# Patient Record
Sex: Male | Born: 1935 | Race: White | Hispanic: No | Marital: Married | Smoking: Former smoker
Health system: Southern US, Community
[De-identification: ages and names within clinical notes are randomized; demographics above are authoritative.]

## PROBLEM LIST (undated history)

## (undated) DIAGNOSIS — M1990 Unspecified osteoarthritis, unspecified site: Secondary | ICD-10-CM

## (undated) DIAGNOSIS — C3410 Malignant neoplasm of upper lobe, unspecified bronchus or lung: Secondary | ICD-10-CM

## (undated) DIAGNOSIS — I442 Atrioventricular block, complete: Secondary | ICD-10-CM

## (undated) DIAGNOSIS — J439 Emphysema, unspecified: Secondary | ICD-10-CM

## (undated) DIAGNOSIS — E785 Hyperlipidemia, unspecified: Secondary | ICD-10-CM

## (undated) DIAGNOSIS — I7300 Raynaud's syndrome without gangrene: Secondary | ICD-10-CM

## (undated) DIAGNOSIS — I2510 Atherosclerotic heart disease of native coronary artery without angina pectoris: Secondary | ICD-10-CM

## (undated) DIAGNOSIS — J301 Allergic rhinitis due to pollen: Secondary | ICD-10-CM

## (undated) DIAGNOSIS — C61 Malignant neoplasm of prostate: Secondary | ICD-10-CM

## (undated) DIAGNOSIS — H3530 Unspecified macular degeneration: Secondary | ICD-10-CM

## (undated) DIAGNOSIS — I714 Abdominal aortic aneurysm, without rupture, unspecified: Secondary | ICD-10-CM

## (undated) DIAGNOSIS — H9313 Tinnitus, bilateral: Secondary | ICD-10-CM

## (undated) HISTORY — DX: Abdominal aortic aneurysm, without rupture, unspecified: I71.40

## (undated) HISTORY — DX: Atrioventricular block, complete: I44.2

## (undated) HISTORY — DX: Unspecified macular degeneration: H35.30

## (undated) HISTORY — PX: ANGIOPLASTY / STENTING FEMORAL: SUR30

## (undated) HISTORY — DX: Unspecified osteoarthritis, unspecified site: M19.90

## (undated) HISTORY — DX: Atherosclerotic heart disease of native coronary artery without angina pectoris: I25.10

## (undated) HISTORY — PX: POLYPECTOMY: SHX149

## (undated) HISTORY — PX: CATARACT EXTRACTION, BILATERAL: SHX1313

## (undated) HISTORY — DX: Raynaud's syndrome without gangrene: I73.00

## (undated) HISTORY — DX: Tinnitus, bilateral: H93.13

## (undated) HISTORY — DX: Malignant neoplasm of upper lobe, unspecified bronchus or lung: C34.10

## (undated) HISTORY — DX: Allergic rhinitis due to pollen: J30.1

## (undated) HISTORY — DX: Hyperlipidemia, unspecified: E78.5

## (undated) HISTORY — DX: Emphysema, unspecified: J43.9

## (undated) HISTORY — DX: Malignant neoplasm of prostate: C61

## (undated) HISTORY — DX: Abdominal aortic aneurysm, without rupture: I71.4

---

## 1998-01-19 DIAGNOSIS — C3410 Malignant neoplasm of upper lobe, unspecified bronchus or lung: Secondary | ICD-10-CM

## 1998-01-19 DIAGNOSIS — C61 Malignant neoplasm of prostate: Secondary | ICD-10-CM

## 1998-01-19 HISTORY — DX: Malignant neoplasm of prostate: C61

## 1998-01-19 HISTORY — PX: TRANSPERINEAL IMPLANT OF RADIATION SEEDS W/ ULTRASOUND: SUR1381

## 1998-01-19 HISTORY — PX: LUNG LOBECTOMY: SHX167

## 1998-01-19 HISTORY — DX: Malignant neoplasm of upper lobe, unspecified bronchus or lung: C34.10

## 1998-07-01 ENCOUNTER — Other Ambulatory Visit: Admission: RE | Admit: 1998-07-01 | Discharge: 1998-07-01 | Payer: Self-pay | Admitting: Urology

## 1998-07-17 ENCOUNTER — Encounter: Admission: RE | Admit: 1998-07-17 | Discharge: 1998-10-15 | Payer: Self-pay | Admitting: Radiation Oncology

## 1998-08-23 ENCOUNTER — Encounter: Payer: Self-pay | Admitting: Radiation Oncology

## 1998-09-04 ENCOUNTER — Ambulatory Visit (HOSPITAL_COMMUNITY): Admission: RE | Admit: 1998-09-04 | Discharge: 1998-09-04 | Payer: Self-pay | Admitting: Radiation Oncology

## 1998-09-18 ENCOUNTER — Encounter: Payer: Self-pay | Admitting: Thoracic Surgery

## 1998-09-19 ENCOUNTER — Encounter: Payer: Self-pay | Admitting: Thoracic Surgery

## 1998-09-19 ENCOUNTER — Inpatient Hospital Stay (HOSPITAL_COMMUNITY): Admission: RE | Admit: 1998-09-19 | Discharge: 1998-09-26 | Payer: Self-pay | Admitting: Thoracic Surgery

## 1998-09-20 ENCOUNTER — Encounter: Payer: Self-pay | Admitting: Thoracic Surgery

## 1998-09-21 ENCOUNTER — Encounter: Payer: Self-pay | Admitting: Thoracic Surgery

## 1998-09-22 ENCOUNTER — Encounter: Payer: Self-pay | Admitting: Thoracic Surgery

## 1998-09-23 ENCOUNTER — Encounter: Payer: Self-pay | Admitting: Thoracic Surgery

## 1998-09-24 ENCOUNTER — Encounter: Payer: Self-pay | Admitting: Thoracic Surgery

## 1998-09-25 ENCOUNTER — Encounter: Payer: Self-pay | Admitting: Thoracic Surgery

## 1998-09-26 ENCOUNTER — Encounter: Payer: Self-pay | Admitting: Thoracic Surgery

## 1998-10-30 ENCOUNTER — Encounter: Admission: RE | Admit: 1998-10-30 | Discharge: 1998-10-30 | Payer: Self-pay | Admitting: Thoracic Surgery

## 1998-12-11 ENCOUNTER — Encounter: Payer: Self-pay | Admitting: Thoracic Surgery

## 1998-12-11 ENCOUNTER — Encounter: Admission: RE | Admit: 1998-12-11 | Discharge: 1998-12-11 | Payer: Self-pay | Admitting: Thoracic Surgery

## 1998-12-18 ENCOUNTER — Encounter: Admission: RE | Admit: 1998-12-18 | Discharge: 1999-03-18 | Payer: Self-pay | Admitting: Radiation Oncology

## 1999-01-03 ENCOUNTER — Ambulatory Visit (HOSPITAL_BASED_OUTPATIENT_CLINIC_OR_DEPARTMENT_OTHER): Admission: RE | Admit: 1999-01-03 | Discharge: 1999-01-03 | Payer: Self-pay | Admitting: Urology

## 1999-01-03 ENCOUNTER — Encounter: Payer: Self-pay | Admitting: Urology

## 1999-01-24 ENCOUNTER — Encounter: Payer: Self-pay | Admitting: Radiation Oncology

## 1999-05-14 ENCOUNTER — Encounter: Admission: RE | Admit: 1999-05-14 | Discharge: 1999-05-14 | Payer: Self-pay | Admitting: Thoracic Surgery

## 1999-05-14 ENCOUNTER — Encounter: Payer: Self-pay | Admitting: Thoracic Surgery

## 1999-09-12 ENCOUNTER — Encounter: Admission: RE | Admit: 1999-09-12 | Discharge: 1999-09-12 | Payer: Self-pay | Admitting: Thoracic Surgery

## 1999-09-12 ENCOUNTER — Encounter: Payer: Self-pay | Admitting: Thoracic Surgery

## 2000-03-16 ENCOUNTER — Encounter: Admission: RE | Admit: 2000-03-16 | Discharge: 2000-03-16 | Payer: Self-pay | Admitting: Thoracic Surgery

## 2000-03-16 ENCOUNTER — Encounter: Payer: Self-pay | Admitting: Thoracic Surgery

## 2000-09-17 ENCOUNTER — Encounter: Payer: Self-pay | Admitting: Thoracic Surgery

## 2000-09-17 ENCOUNTER — Encounter: Admission: RE | Admit: 2000-09-17 | Discharge: 2000-09-17 | Payer: Self-pay | Admitting: Thoracic Surgery

## 2001-03-02 ENCOUNTER — Encounter: Admission: RE | Admit: 2001-03-02 | Discharge: 2001-03-02 | Payer: Self-pay | Admitting: Thoracic Surgery

## 2001-03-02 ENCOUNTER — Encounter: Payer: Self-pay | Admitting: Thoracic Surgery

## 2002-03-08 ENCOUNTER — Encounter: Payer: Self-pay | Admitting: Family Medicine

## 2002-03-08 ENCOUNTER — Encounter: Admission: RE | Admit: 2002-03-08 | Discharge: 2002-03-08 | Payer: Self-pay | Admitting: Family Medicine

## 2002-04-30 ENCOUNTER — Encounter: Payer: Self-pay | Admitting: Gastroenterology

## 2003-03-23 ENCOUNTER — Encounter: Admission: RE | Admit: 2003-03-23 | Discharge: 2003-03-23 | Payer: Self-pay | Admitting: Family Medicine

## 2004-02-18 ENCOUNTER — Encounter: Admission: RE | Admit: 2004-02-18 | Discharge: 2004-02-18 | Payer: Self-pay | Admitting: Family Medicine

## 2004-06-09 ENCOUNTER — Ambulatory Visit: Payer: Self-pay | Admitting: Family Medicine

## 2004-06-17 ENCOUNTER — Ambulatory Visit: Payer: Self-pay | Admitting: Family Medicine

## 2004-06-24 ENCOUNTER — Encounter: Admission: RE | Admit: 2004-06-24 | Discharge: 2004-06-24 | Payer: Self-pay | Admitting: Family Medicine

## 2005-01-19 HISTORY — PX: COLONOSCOPY: SHX174

## 2005-02-09 ENCOUNTER — Ambulatory Visit: Payer: Self-pay | Admitting: Gastroenterology

## 2005-02-20 ENCOUNTER — Encounter: Admission: RE | Admit: 2005-02-20 | Discharge: 2005-02-20 | Payer: Self-pay | Admitting: Family Medicine

## 2005-02-25 ENCOUNTER — Encounter (INDEPENDENT_AMBULATORY_CARE_PROVIDER_SITE_OTHER): Payer: Self-pay | Admitting: *Deleted

## 2005-02-26 ENCOUNTER — Encounter (INDEPENDENT_AMBULATORY_CARE_PROVIDER_SITE_OTHER): Payer: Self-pay | Admitting: *Deleted

## 2005-02-26 ENCOUNTER — Ambulatory Visit: Payer: Self-pay | Admitting: Gastroenterology

## 2005-06-19 ENCOUNTER — Ambulatory Visit: Payer: Self-pay | Admitting: Family Medicine

## 2005-06-22 ENCOUNTER — Encounter: Admission: RE | Admit: 2005-06-22 | Discharge: 2005-06-22 | Payer: Self-pay | Admitting: Family Medicine

## 2005-06-22 ENCOUNTER — Encounter: Payer: Self-pay | Admitting: Family Medicine

## 2006-03-15 ENCOUNTER — Encounter: Admission: RE | Admit: 2006-03-15 | Discharge: 2006-03-15 | Payer: Self-pay | Admitting: Family Medicine

## 2006-07-08 ENCOUNTER — Ambulatory Visit: Payer: Self-pay | Admitting: Family Medicine

## 2006-07-08 LAB — CONVERTED CEMR LAB
Alkaline Phosphatase: 93 units/L (ref 39–117)
Basophils Absolute: 0 10*3/uL (ref 0.0–0.1)
Basophils Relative: 0.2 % (ref 0.0–1.0)
Calcium: 9.4 mg/dL (ref 8.4–10.5)
Chloride: 102 meq/L (ref 96–112)
Creatinine, Ser: 0.7 mg/dL (ref 0.4–1.5)
Eosinophils Absolute: 0.2 10*3/uL (ref 0.0–0.6)
Eosinophils Relative: 1.8 % (ref 0.0–5.0)
HCT: 43.9 % (ref 39.0–52.0)
LDL Cholesterol: 131 mg/dL — ABNORMAL HIGH (ref 0–99)
MCHC: 34.2 g/dL (ref 30.0–36.0)
Monocytes Absolute: 0.7 10*3/uL (ref 0.2–0.7)
Monocytes Relative: 7.9 % (ref 3.0–11.0)
Neutro Abs: 6.4 10*3/uL (ref 1.4–7.7)
Neutrophils Relative %: 72.2 % (ref 43.0–77.0)
Platelets: 248 10*3/uL (ref 150–400)
RDW: 12.1 % (ref 11.5–14.6)
Sodium: 136 meq/L (ref 135–145)
Triglycerides: 88 mg/dL (ref 0–149)
VLDL: 18 mg/dL (ref 0–40)

## 2006-08-16 DIAGNOSIS — C61 Malignant neoplasm of prostate: Secondary | ICD-10-CM

## 2006-08-16 DIAGNOSIS — I739 Peripheral vascular disease, unspecified: Secondary | ICD-10-CM | POA: Insufficient documentation

## 2006-08-16 DIAGNOSIS — B182 Chronic viral hepatitis C: Secondary | ICD-10-CM | POA: Insufficient documentation

## 2006-08-16 DIAGNOSIS — C3410 Malignant neoplasm of upper lobe, unspecified bronchus or lung: Secondary | ICD-10-CM

## 2007-03-14 ENCOUNTER — Ambulatory Visit: Payer: Self-pay | Admitting: Family Medicine

## 2007-07-11 ENCOUNTER — Ambulatory Visit: Payer: Self-pay | Admitting: Family Medicine

## 2007-07-11 DIAGNOSIS — I714 Abdominal aortic aneurysm, without rupture: Secondary | ICD-10-CM

## 2007-07-11 LAB — CONVERTED CEMR LAB
BUN: 15 mg/dL (ref 6–23)
Basophils Absolute: 0.1 10*3/uL (ref 0.0–0.1)
Bilirubin, Direct: 0.1 mg/dL (ref 0.0–0.3)
Chloride: 101 meq/L (ref 96–112)
Cholesterol: 186 mg/dL (ref 0–200)
Eosinophils Absolute: 0.1 10*3/uL (ref 0.0–0.7)
Glucose, Bld: 99 mg/dL (ref 70–99)
HCT: 41.4 % (ref 39.0–52.0)
HDL: 32.9 mg/dL — ABNORMAL LOW (ref >39.0)
Hemoglobin: 14.6 g/dL (ref 13.0–17.0)
LDL Cholesterol: 135 mg/dL — ABNORMAL HIGH (ref 0–99)
Lymphocytes Relative: 14.1 % (ref 12.0–46.0)
MCHC: 35.3 g/dL (ref 30.0–36.0)
Monocytes Absolute: 0.7 10*3/uL (ref 0.1–1.0)
Monocytes Relative: 8.7 % (ref 3.0–12.0)
Neutro Abs: 6.4 10*3/uL (ref 1.4–7.7)
Neutrophils Relative %: 75.4 % (ref 43.0–77.0)
Platelets: 218 10*3/uL (ref 150–400)
RDW: 12.6 % (ref 11.5–14.6)
Sodium: 136 meq/L (ref 135–145)
WBC: 8.5 10*3/uL (ref 4.5–10.5)

## 2007-07-18 ENCOUNTER — Telehealth: Payer: Self-pay | Admitting: *Deleted

## 2007-07-28 ENCOUNTER — Ambulatory Visit: Payer: Self-pay | Admitting: Cardiology

## 2007-07-28 ENCOUNTER — Encounter: Payer: Self-pay | Admitting: Family Medicine

## 2008-02-29 ENCOUNTER — Telehealth: Payer: Self-pay | Admitting: Family Medicine

## 2008-03-05 ENCOUNTER — Ambulatory Visit: Payer: Self-pay | Admitting: Family Medicine

## 2008-07-20 ENCOUNTER — Telehealth: Payer: Self-pay | Admitting: Family Medicine

## 2008-07-20 ENCOUNTER — Ambulatory Visit: Payer: Self-pay | Admitting: Family Medicine

## 2008-07-26 ENCOUNTER — Telehealth: Payer: Self-pay | Admitting: *Deleted

## 2008-11-19 DIAGNOSIS — I2510 Atherosclerotic heart disease of native coronary artery without angina pectoris: Secondary | ICD-10-CM

## 2008-11-19 HISTORY — DX: Atherosclerotic heart disease of native coronary artery without angina pectoris: I25.10

## 2008-11-29 ENCOUNTER — Inpatient Hospital Stay (HOSPITAL_COMMUNITY): Admission: EM | Admit: 2008-11-29 | Discharge: 2008-12-02 | Payer: Self-pay | Admitting: Emergency Medicine

## 2008-11-29 ENCOUNTER — Ambulatory Visit: Payer: Self-pay | Admitting: Cardiology

## 2008-11-30 ENCOUNTER — Encounter: Payer: Self-pay | Admitting: Cardiology

## 2008-12-04 ENCOUNTER — Telehealth: Payer: Self-pay | Admitting: Cardiology

## 2008-12-07 ENCOUNTER — Encounter: Payer: Self-pay | Admitting: Cardiology

## 2008-12-19 ENCOUNTER — Encounter: Payer: Self-pay | Admitting: Physician Assistant

## 2008-12-19 ENCOUNTER — Ambulatory Visit: Payer: Self-pay | Admitting: Cardiology

## 2008-12-19 DIAGNOSIS — E785 Hyperlipidemia, unspecified: Secondary | ICD-10-CM

## 2008-12-19 DIAGNOSIS — Z9861 Coronary angioplasty status: Secondary | ICD-10-CM

## 2008-12-19 DIAGNOSIS — I2510 Atherosclerotic heart disease of native coronary artery without angina pectoris: Secondary | ICD-10-CM

## 2008-12-19 DIAGNOSIS — F17201 Nicotine dependence, unspecified, in remission: Secondary | ICD-10-CM

## 2008-12-25 ENCOUNTER — Encounter: Payer: Self-pay | Admitting: Cardiology

## 2009-01-23 ENCOUNTER — Ambulatory Visit: Payer: Self-pay | Admitting: Cardiology

## 2009-01-23 LAB — CONVERTED CEMR LAB
ALT: 56 units/L — ABNORMAL HIGH (ref 0–53)
Albumin: 4 g/dL (ref 3.5–5.2)
Bilirubin, Direct: 0 mg/dL (ref 0.0–0.3)
Cholesterol: 109 mg/dL (ref 0–200)
LDL Cholesterol: 47 mg/dL (ref 0–99)
Total CHOL/HDL Ratio: 3
VLDL: 18.6 mg/dL (ref 0.0–40.0)

## 2009-01-25 ENCOUNTER — Ambulatory Visit: Payer: Self-pay | Admitting: Cardiology

## 2009-01-25 ENCOUNTER — Encounter (INDEPENDENT_AMBULATORY_CARE_PROVIDER_SITE_OTHER): Payer: Self-pay | Admitting: *Deleted

## 2009-01-25 DIAGNOSIS — R0989 Other specified symptoms and signs involving the circulatory and respiratory systems: Secondary | ICD-10-CM

## 2009-01-25 LAB — CONVERTED CEMR LAB
BUN: 16 mg/dL (ref 6–23)
Basophils Absolute: 0.1 10*3/uL (ref 0.0–0.1)
Basophils Relative: 0.9 % (ref 0.0–3.0)
CO2: 29 meq/L (ref 19–32)
Chloride: 102 meq/L (ref 96–112)
Creatinine, Ser: 0.9 mg/dL (ref 0.4–1.5)
Eosinophils Relative: 1.8 % (ref 0.0–5.0)
GFR calc non Af Amer: 88.15 mL/min (ref >60)
INR: 1 (ref 0.8–1.0)
Lymphocytes Relative: 15.9 % (ref 12.0–46.0)
Lymphs Abs: 1.2 10*3/uL (ref 0.7–4.0)
MCHC: 33.7 g/dL (ref 30.0–36.0)
RBC: 3.77 M/uL — ABNORMAL LOW (ref 4.22–5.81)
RDW: 12.4 % (ref 11.5–14.6)
Sodium: 137 meq/L (ref 135–145)
WBC: 7.6 10*3/uL (ref 4.5–10.5)

## 2009-01-28 ENCOUNTER — Ambulatory Visit (HOSPITAL_COMMUNITY): Admission: RE | Admit: 2009-01-28 | Discharge: 2009-01-28 | Payer: Self-pay | Admitting: Cardiovascular Disease

## 2009-01-28 ENCOUNTER — Ambulatory Visit: Payer: Self-pay | Admitting: Cardiovascular Disease

## 2009-02-08 ENCOUNTER — Encounter: Payer: Self-pay | Admitting: Family Medicine

## 2009-02-08 ENCOUNTER — Ambulatory Visit: Payer: Self-pay

## 2009-03-19 ENCOUNTER — Ambulatory Visit: Payer: Self-pay | Admitting: Cardiology

## 2009-03-20 LAB — CONVERTED CEMR LAB
Bilirubin, Direct: 0 mg/dL (ref 0.0–0.3)
Total Bilirubin: 0.6 mg/dL (ref 0.3–1.2)
Total Protein: 7.8 g/dL (ref 6.0–8.3)

## 2009-04-29 ENCOUNTER — Telehealth: Payer: Self-pay | Admitting: Cardiology

## 2009-08-14 ENCOUNTER — Ambulatory Visit: Payer: Self-pay | Admitting: Family Medicine

## 2009-08-16 DIAGNOSIS — J309 Allergic rhinitis, unspecified: Secondary | ICD-10-CM | POA: Insufficient documentation

## 2009-08-19 LAB — CONVERTED CEMR LAB
AST: 36 units/L (ref 0–37)
Basophils Absolute: 0 10*3/uL (ref 0.0–0.1)
Basophils Relative: 0.3 % (ref 0.0–3.0)
CO2: 28 meq/L (ref 19–32)
Calcium: 9 mg/dL (ref 8.4–10.5)
Creatinine, Ser: 0.7 mg/dL (ref 0.4–1.5)
Direct LDL: 50.8 mg/dL
Eosinophils Absolute: 0.2 10*3/uL (ref 0.0–0.7)
Eosinophils Relative: 2.5 % (ref 0.0–5.0)
GFR calc non Af Amer: 110.32 mL/min (ref >60)
Glucose, Bld: 105 mg/dL — ABNORMAL HIGH (ref 70–99)
HCT: 38.9 % — ABNORMAL LOW (ref 39.0–52.0)
Monocytes Relative: 8.2 % (ref 3.0–12.0)
Neutro Abs: 6.2 10*3/uL (ref 1.4–7.7)
RBC: 3.79 M/uL — ABNORMAL LOW (ref 4.22–5.81)
Sodium: 130 meq/L — ABNORMAL LOW (ref 135–145)
Total Bilirubin: 0.8 mg/dL (ref 0.3–1.2)

## 2009-09-30 ENCOUNTER — Ambulatory Visit: Payer: Self-pay | Admitting: Cardiology

## 2009-10-18 ENCOUNTER — Telehealth: Payer: Self-pay | Admitting: Cardiology

## 2010-02-10 ENCOUNTER — Encounter: Payer: Self-pay | Admitting: Family Medicine

## 2010-02-10 ENCOUNTER — Ambulatory Visit
Admission: RE | Admit: 2010-02-10 | Discharge: 2010-02-10 | Payer: Self-pay | Source: Home / Self Care | Attending: Family Medicine | Admitting: Family Medicine

## 2010-02-10 ENCOUNTER — Other Ambulatory Visit: Payer: Self-pay | Admitting: Family Medicine

## 2010-02-10 LAB — BASIC METABOLIC PANEL
CO2: 27 mEq/L (ref 19–32)
Calcium: 9.6 mg/dL (ref 8.4–10.5)
GFR: 85.69 mL/min (ref >60.00)
Potassium: 5 mEq/L (ref 3.5–5.1)
Sodium: 134 mEq/L — ABNORMAL LOW (ref 135–145)

## 2010-02-10 LAB — HEPATIC FUNCTION PANEL
ALT: 34 U/L (ref 0–53)
Albumin: 4.3 g/dL (ref 3.5–5.2)
Alkaline Phosphatase: 82 U/L (ref 39–117)
Total Bilirubin: 0.7 mg/dL (ref 0.3–1.2)
Total Protein: 7.2 g/dL (ref 6.0–8.3)

## 2010-02-10 LAB — LIPID PANEL
Cholesterol: 111 mg/dL (ref 0–200)
HDL: 41.3 mg/dL (ref >39.00)
LDL Cholesterol: 50 mg/dL (ref 0–99)
Total CHOL/HDL Ratio: 3
Triglycerides: 99 mg/dL (ref 0.0–149.0)
VLDL: 19.8 mg/dL (ref 0.0–40.0)

## 2010-02-10 LAB — CBC WITH DIFFERENTIAL/PLATELET
Eosinophils Relative: 3.1 % (ref 0.0–5.0)
HCT: 40.7 % (ref 39.0–52.0)
Hemoglobin: 14.1 g/dL (ref 13.0–17.0)
Lymphs Abs: 1.6 10*3/uL (ref 0.7–4.0)
MCHC: 34.5 g/dL (ref 30.0–36.0)
Monocytes Absolute: 0.7 10*3/uL (ref 0.1–1.0)
WBC: 8.9 10*3/uL (ref 4.5–10.5)

## 2010-02-12 ENCOUNTER — Ambulatory Visit
Admission: RE | Admit: 2010-02-12 | Discharge: 2010-02-12 | Payer: Self-pay | Source: Home / Self Care | Attending: Family Medicine | Admitting: Family Medicine

## 2010-02-12 DIAGNOSIS — I10 Essential (primary) hypertension: Secondary | ICD-10-CM | POA: Insufficient documentation

## 2010-02-14 ENCOUNTER — Encounter: Payer: Self-pay | Admitting: Gastroenterology

## 2010-02-16 LAB — CONVERTED CEMR LAB
ALT: 22 units/L (ref 0–53)
AST: 24 units/L (ref 0–37)
Albumin: 4.2 g/dL (ref 3.5–5.2)
Basophils Relative: 0.7 % (ref 0.0–3.0)
CO2: 30 meq/L (ref 19–32)
Calcium: 9.3 mg/dL (ref 8.4–10.5)
Chloride: 102 meq/L (ref 96–112)
Cholesterol: 183 mg/dL (ref 0–200)
Glucose, Bld: 101 mg/dL — ABNORMAL HIGH (ref 70–99)
HDL: 49.9 mg/dL (ref >39.00)
LDL Cholesterol: 121 mg/dL — ABNORMAL HIGH (ref 0–99)
Lymphocytes Relative: 15 % (ref 12.0–46.0)
Monocytes Absolute: 0.6 10*3/uL (ref 0.1–1.0)
Neutro Abs: 5.7 10*3/uL (ref 1.4–7.7)
Neutrophils Relative %: 74.8 % (ref 43.0–77.0)
PSA: 0.04 ng/mL — ABNORMAL LOW (ref 0.10–4.00)
Platelets: 202 10*3/uL (ref 150.0–400.0)
Potassium: 5 meq/L (ref 3.5–5.1)
Protein, U semiquant: NEGATIVE
RBC: 4.04 M/uL — ABNORMAL LOW (ref 4.22–5.81)
Sodium: 141 meq/L (ref 135–145)
Specific Gravity, Urine: 1.01
Total Bilirubin: 1 mg/dL (ref 0.3–1.2)
Total Protein: 7.7 g/dL (ref 6.0–8.3)
Triglycerides: 60 mg/dL (ref 0.0–149.0)
Urobilinogen, UA: 0.2
VLDL: 12 mg/dL (ref 0.0–40.0)
WBC Urine, dipstick: NEGATIVE
WBC: 7.7 10*3/uL (ref 4.5–10.5)
pH: 6

## 2010-02-18 NOTE — Assessment & Plan Note (Signed)
Summary: per check out/sf   CC:  dizziness.  History of Present Illness: Pleasant gentleman admitted to [facility] in November with an acute inferior myocardial infarction.  A cardiac catheterization was performed on November 11. There was a 30% left main. There was a 70-75% LAD. The circumflex was occluded. There was a 50% ostial and 50% mid right coronary artery. Ejection fraction was 50% with inferior hypokinesis. The patient had a bare-metal stent to the circumflex at that time. It was felt that he may require IVUS and PCI of the LAD electively after he recovered. He underwent repeat cardiac catheterization in January of 2011. He was found to have a 50% LAD and a 50-70% circumflex distal to the stent. He had IVUS and lesions or not felt to be flow limiting. He was therefore treated medically. He also had a followup abdominal ultrasound in January of 2011 that showed a 2.4 x 2.4 cm abdominal aortic aneurysm. Followup was recommended in 2 years. I last saw him in January of 2011. Since then the patient has dyspnea with more extreme activities but not with routine activities. It is relieved with rest. It is not associated with chest pain. There is no orthopnea, PND or pedal edema. There is no syncope or palpitations. There is no exertional chest pain.   Current Medications (verified): 1)  Multivitamins   Tabs (Multiple Vitamin) .... Qd 2)  Bufferin 325 Mg Tabs (Aspirin Buf(Cacarb-Mgcarb-Mgo)) .... Take One Daily 3)  Lutein 10 Mg  Tabs (Lutein) .... Two Times A Day 4)  Plavix 75 Mg Tabs (Clopidogrel Bisulfate) .... Take One Daily 5)  Lisinopril 2.5 Mg Tabs (Lisinopril) .... Take One Daily 6)  Metoprolol Tartrate 25 Mg Tabs (Metoprolol Tartrate) .... Take Two Times A Day 7)  Crestor 40 Mg Tabs (Rosuvastatin Calcium) .... Take One Daily  Allergies: No Known Drug Allergies  Past History:  Past Medical History: Peripheral vascular disease Hearing Loss Hepatitis C, hx of  Lung cancer, hx  of Prostate cancer, hx of Varicocele-Lt coronary artery disease abdominal aortic aneurysm  Social History: Reviewed history from 01/25/2009 and no changes required. Retired Married  tobacco abuse discontinued Alcohol use-no Drug use-no Regular exercise-yes  Review of Systems       no fevers or chills, productive cough, hemoptysis, dysphasia, odynophagia, melena, hematochezia, dysuria, hematuria, rash, seizure activity, orthopnea, PND, pedal edema, claudication. Remaining systems are negative.   Vital Signs:  Patient profile:   76 year old male Height:      70 inches Weight:      157 pounds BMI:     22.61 Pulse rate:   70 / minute Resp:     12 per minute BP sitting:   124 / 70  (left arm)  Vitals Entered By: Kem Parkinson (March 19, 2009 9:35 AM)  Physical Exam  General:  Well-developed well-nourished in no acute distress.  Skin is warm and dry.  HEENT is normal.  Neck is supple. No thyromegaly. no bruits. Chest is clear to auscultation with normal expansion.  Cardiovascular exam is regular rate and rhythm.  Abdominal exam nontender or distended. No masses palpated. Extremities show no edema. neuro grossly intact    Impression & Recommendations:  Problem # 1:  HYPERLIPIDEMIA-MIXED (ICD-272.4)  Continue statin. Recent liver functions mildly elevated. We'll repeat. His updated medication list for this problem includes:    Crestor 40 Mg Tabs (Rosuvastatin calcium) .Marland Kitchen... Take one daily  His updated medication list for this problem includes:  Crestor 40 Mg Tabs (Rosuvastatin calcium) .Marland Kitchen... Take one daily  Orders: TLB-Hepatic/Liver Function Pnl (80076-HEPATIC)  Problem # 2:  TOBACCO ABUSE (ICD-305.1) Now resolved.  Problem # 3:  CAD, NATIVE VESSEL (ICD-414.01) Continue aspirin, Plavix, ACE inhibitor, beta blocker and statin. His updated medication list for this problem includes:    Bufferin 325 Mg Tabs (Aspirin buf(cacarb-mgcarb-mgo)) .Marland Kitchen... Take one  daily    Plavix 75 Mg Tabs (Clopidogrel bisulfate) .Marland Kitchen... Take one daily    Lisinopril 2.5 Mg Tabs (Lisinopril) .Marland Kitchen... Take one daily    Metoprolol Tartrate 25 Mg Tabs (Metoprolol tartrate) .Marland Kitchen... Take two times a day  Problem # 4:  ABDOMINAL AORTIC ANEURYSM (ICD-441.4) Followup abdominal ultrasound in January of 2013.  Problem # 5:  PERIPHERAL VASCULAR DISEASE (ICD-443.9) Continue aspirin and statin.  Problem # 6:  LUNG CANCER, HX OF (ICD-V10.11)  Problem # 7:  PROSTATE CANCER, HX OF (ICD-V10.46)  Patient Instructions: 1)  Your physician recommends that you schedule a follow-up appointment in: 6 MONTHS

## 2010-02-18 NOTE — Assessment & Plan Note (Signed)
Summary: 1 MONTH ROV FASTING LAB ALSO/SL   CC:  dizziness.  History of Present Illness: Pleasant gentleman admitted to [facility] in November with an acute inferior myocardial infarction.  A cardiac catheterization was performed on November 11. There was a 30% left main. There was a 70-75% LAD. The circumflex was occluded. There was a 50% ostial and 50% mid right coronary artery. Ejection fraction was 50% with inferior hypokinesis. The patient had a bare-metal stent to the circumflex at that time. It was felt that he may require IVUS and PCI of the LAD electively after he recovered.Since then he denies any dyspnea on exertion, orthopnea, PND, pedal edema, palpitations or syncope. He occasionally feels a mild tightness in the upper chest when he begins his walks. However it resolves as he continues. It is not associated with shortness of breath, nausea or diaphoresis. It is unlike his infarct pain. It does not radiate. It is not pleuritic or positional.  Current Medications (verified): 1)  Multivitamins   Tabs (Multiple Vitamin) .... Qd 2)  Bufferin 325 Mg Tabs (Aspirin Buf(Cacarb-Mgcarb-Mgo)) .... Take One Daily 3)  Lutein 10 Mg  Tabs (Lutein) .... Two Times A Day 4)  Plavix 75 Mg Tabs (Clopidogrel Bisulfate) .... Take One Daily 5)  Lisinopril 2.5 Mg Tabs (Lisinopril) .... Take One Daily 6)  Metoprolol Tartrate 25 Mg Tabs (Metoprolol Tartrate) .... Take Two Times A Day 7)  Crestor 40 Mg Tabs (Rosuvastatin Calcium) .... Take One Daily  Allergies: No Known Drug Allergies  Past History:  Past Medical History: Peripheral vascular disease Hearing Loss Hepatitis C, hx of  Lung cancer, hx of Prostate cancer, hx of Tab Abuse Varicocele-Lt coronary artery disease abdominal aortic aneurysm  Social History: Reviewed history from 07/11/2007 and no changes required. Retired Married  Present smoker Alcohol use-no Drug use-no Regular exercise-yes  Review of Systems       Occasional  mild hematochezia from hemorrhoids but no fevers or chills, productive cough, hemoptysis, dysphasia, odynophagia, melena, dysuria, hematuria, rash, seizure activity, orthopnea, PND, pedal edema, claudication. Remaining systems are negative.   Vital Signs:  Patient profile:   76 year old male Height:      70 inches Weight:      157 pounds BMI:     22.61 Pulse rate:   65 / minute Resp:     12 per minute BP sitting:   126 / 75  (left arm)  Vitals Entered By: Kem Parkinson (January 25, 2009 9:59 AM)  Physical Exam  General:  Well-developed well-nourished in no acute distress.  Skin is warm and dry.  HEENT is normal.  Neck is supple. No thyromegaly.  Chest is clear to auscultation with normal expansion.  Cardiovascular exam is regular rate and rhythm.  Abdominal exam nontender or distended. No masses palpated. Extremities show no edema. neuro grossly intact    Impression & Recommendations:  Problem # 1:  CAD, NATIVE VESSEL (ICD-414.01) Continue aspirin, Plavix, ACE inhibitor, beta blocker and statin. I will arrange the patient to undergo repeat cardiac catheterization and probable PCI of his LAD with Dr. Excell Seltzer. Risk and benefits have been discussed and he agrees to proceed. His updated medication list for this problem includes:    Bufferin 325 Mg Tabs (Aspirin buf(cacarb-mgcarb-mgo)) .Marland Kitchen... Take one daily    Plavix 75 Mg Tabs (Clopidogrel bisulfate) .Marland Kitchen... Take one daily    Lisinopril 2.5 Mg Tabs (Lisinopril) .Marland Kitchen... Take one daily    Metoprolol Tartrate 25 Mg Tabs (Metoprolol tartrate) .Marland KitchenMarland KitchenMarland KitchenMarland Kitchen  Take two times a day  Problem # 2:  HYPERLIPIDEMIA-MIXED (ICD-272.4) Continue statin. Recheck liver functions in 12 weeks as they're minimally elevated. His updated medication list for this problem includes:    Crestor 40 Mg Tabs (Rosuvastatin calcium) .Marland Kitchen... Take one daily  Problem # 3:  TOBACCO ABUSE (ICD-305.1) Patient counseled on discontinuing for between 3-10 minutes.  Problem # 4:   ABDOMINAL AORTIC ANEURYSM (ICD-441.4) Schedule followup abdominal ultrasound  Problem # 5:  LUNG CANCER, HX OF (ICD-V10.11)  Problem # 6:  PROSTATE CANCER, HX OF (ICD-V10.46)  Problem # 7:  PERIPHERAL VASCULAR DISEASE (ICD-443.9) Continue aspirin and statin.  Other Orders: Cardiac Catheterization (Cardiac Cath) TLB-BMP (Basic Metabolic Panel-BMET) (80048-METABOL) TLB-CBC Platelet - w/Differential (85025-CBCD) TLB-PT (Protime) (85610-PTP) Abdominal Aorta Duplex (Abd Aorta Duplex)  Patient Instructions: 1)  Your physician recommends that you schedule a follow-up appointment in:8 WEEKS  2)  Your physician has requested that you have a cardiac catheterization.  Cardiac catheterization is used to diagnose and/or treat various heart conditions. Doctors may recommend this procedure for a number of different reasons. The most common reason is to evaluate chest pain. Chest pain can be a symptom of coronary artery disease (CAD), and cardiac catheterization can show whether plaque is narrowing or blocking your heart's arteries. This procedure is also used to evaluate the valves, as well as measure the blood flow and oxygen levels in different parts of your heart.  For further information please visit [email or web address]  Please follow instruction sheet, as given. 3)  Your physician has requested that you have an abdominal aorta duplex. During this test, an ultrasound is used to evaluate the aorta. Allow 30 minutes for this exam. Do not eat after midnight the day before and avoid carbonated beverages. There are no restrictions or special instructions.

## 2010-02-18 NOTE — Assessment & Plan Note (Signed)
Summary: NEW PT TO EST/CLE  R/S FROM 8/1&2/11   Vital Signs:  Patient profile:   76 year old male Height:      70 inches Weight:      152.0 pounds BMI:     21.89 Temp:     97.9 degrees F oral Pulse rate:   70 / minute Pulse rhythm:   regular BP sitting:   118 / 80  (left arm) Cuff size:   regular  Vitals Entered By: Benny Lennert CMA Duncan Dull) (August 14, 2009 10:10 AM)  History of Present Illness: Chief complaint new patient to be established from [place] dr todd  76 year old who has been seeing Dr. Tawanna Cooler, here to est in our office.  AAA found on a physical, did a scan has been scanned and followed. does appear is a 2 year followup is planned for this. Stable. 2.4 cm, --- 2013. f/u   coronary artery disease, on aspirin, Plavix, ACE inhibitor, beta blocker, statin.  Tobacco abuse, the patient continues to smoke, 4 cigarettes a day, status post MI, status post lobectomy for lung cancer.  Hyperlipidemia, on Crestor.  Status post lung cancer and prostate cancer  Medications Prior to Update: 1)  Multivitamins   Tabs (Multiple Vitamin) .... Qd 2)  Bufferin 325 Mg Tabs (Aspirin Buf(Cacarb-Mgcarb-Mgo)) .... Take One Daily 3)  Lutein 10 Mg  Tabs (Lutein) .... Two Times A Day 4)  Plavix 75 Mg Tabs (Clopidogrel Bisulfate) .... Take One Daily 5)  Lisinopril 2.5 Mg Tabs (Lisinopril) .... Take One Daily 6)  Metoprolol Tartrate 25 Mg Tabs (Metoprolol Tartrate) .Marland Kitchen.. 1 Tab Take Two Times A Day 7)  Crestor 40 Mg Tabs (Rosuvastatin Calcium) .... Take One Daily  Allergies: No Known Drug Allergies  Past History:  Past medical, surgical, family and social histories (including risk factors) reviewed, and no changes noted (except as noted below).  Past Medical History: Lung CA, 2000, s/p Lobectomy, R MI, 11/2008, PCI, [place] Peripheral vascular disease Hearing Loss Hepatitis C, hx of  Prostate cancer, hx of Varicocele-Lt coronary artery disease abdominal aortic aneurysm Allergic  rhinitis Colonic polyps  Past Surgical History: Lobectomy, 2000, right, [place] Prostate, seeds, 2000  Colonoscopy-02/26/2005  Family History: Reviewed history from 07/11/2007 and no changes required. father is 75 from lung cancer, combination of call mining in smoking.  Mother died at 13 of congestive heart failure.  Two brothers one is reaffirmed corner disease, TB, and is on dialysis.  Other brother has had bypass surgery, coronary disease, ex-smoker.  Two sisters, both in good health  Social History: Reviewed history from 03/19/2009 and no changes required. Retired Married  tobacco abuse discontinued Alcohol use-no Drug use-no Regular exercise-yes  Review of Systems       generally feeling pretty well without any fever. No chills. No nausea or vomiting  Physical Exam  Additional Exam:  GEN: WDWN, NAD, Non-toxic, A & O x 3 HEENT: Atraumatic, Normocephalic. Neck supple. No masses, No LAD. Ears and Nose: No external deformity. CV: RRR, No M/G/R. No JVD. No thrill. No extra heart sounds. PULM: CTA B, no wheezes, crackles, rhonchi. No retractions. No resp. distress. No accessory muscle use. ABD: S, NT, ND, +BS. No rebound tenderness. No HSM.  EXTR: No c/c/e NEURO: Normal gait.  PSYCH: Normally interactive. Conversant. Not depressed or anxious appearing.  Calm demeanor.     Impression & Recommendations:  Problem # 1:  HYPERLIPIDEMIA-MIXED (ICD-272.4)  His updated medication list for this problem includes:  Crestor 40 Mg Tabs (Rosuvastatin calcium) .Marland Kitchen... Take one daily  Orders: TLB-Cholesterol, Direct LDL (83721-DIRLDL)  Labs Reviewed: SGOT: 29 (03/19/2009)   SGPT: 35 (03/19/2009)   HDL:43.30 (01/23/2009), 49.90 (07/20/2008)  LDL:47 (01/23/2009), 121 (75/10/2583)  Chol:109 (01/23/2009), 183 (07/20/2008)  Trig:93.0 (01/23/2009), 60.0 (07/20/2008)  Problem # 2:  ABDOMINAL AORTIC ANEURYSM (ICD-441.4) stable 2013 f/u  Problem # 3:  CAD, NATIVE VESSEL  (ICD-414.01)  His updated medication list for this problem includes:    Bufferin 325 Mg Tabs (Aspirin buf(cacarb-mgcarb-mgo)) .Marland Kitchen... Take one daily    Plavix 75 Mg Tabs (Clopidogrel bisulfate) .Marland Kitchen... Take one daily    Lisinopril 2.5 Mg Tabs (Lisinopril) .Marland Kitchen... Take one daily    Metoprolol Tartrate 25 Mg Tabs (Metoprolol tartrate) .Marland Kitchen... 1 tab take two times a day  Problem # 4:  LUNG CANCER, HX OF (ICD-V10.11)  Problem # 5:  PROSTATE CANCER, HX OF (ICD-V10.46)  Orders: TLB-PSA (Prostate Specific Antigen) (84153-PSA)  Problem # 6:  TOBACCO ABUSE (ICD-305.1)  the patient smokes despite history lung cancer, lobectomy, and myocardial infarction, I spent about 10 minutes talking with him about the importance of tobacco cessation, and this is likely the most important thing he could do for his health  Orders: Tobacco use cessation intensive >10 minutes (27782)  Complete Medication List: 1)  Multivitamins Tabs (Multiple vitamin) .... Qd 2)  Bufferin 325 Mg Tabs (Aspirin buf(cacarb-mgcarb-mgo)) .... Take one daily 3)  Lutein 10 Mg Tabs (Lutein) .... Two times a day 4)  Plavix 75 Mg Tabs (Clopidogrel bisulfate) .... Take one daily 5)  Lisinopril 2.5 Mg Tabs (Lisinopril) .... Take one daily 6)  Metoprolol Tartrate 25 Mg Tabs (Metoprolol tartrate) .Marland Kitchen.. 1 tab take two times a day 7)  Crestor 40 Mg Tabs (Rosuvastatin calcium) .... Take one daily  Other Orders: Venipuncture (42353) TLB-BMP (Basic Metabolic Panel-BMET) (80048-METABOL) TLB-CBC Platelet - w/Differential (85025-CBCD) TLB-Hepatic/Liver Function Pnl (80076-HEPATIC)  Patient Instructions: 1)  labs 3-4 days before 2)  BMP prior to visit, ICD-9: 272.4, v58.69 3)  Hepatic Panel prior to visit ICD-9:  4)  Lipid panel prior to visit ICD-9 : 5)  CBC w/ Diff prior to visit ICD-9 :

## 2010-02-18 NOTE — Progress Notes (Signed)
Summary: QUESTION ABOUT METOPROLOL   Phone Note Call from Patient Call back at Home Phone [phone]   Caller: Patient Summary of Call: PT HAVE QUESTION ABOUT HIS METOPROLOL TARTRATE 25MG  Initial call taken by: Judie Grieve,  April 29, 2009 9:58 AM  Follow-up for Phone Call        talked to pt wife he    New/Updated Medications: METOPROLOL TARTRATE 25 MG TABS (METOPROLOL TARTRATE) 1 tab take two times a day Prescriptions: METOPROLOL TARTRATE 25 MG TABS (METOPROLOL TARTRATE) 1 tab take two times a day  #180 x 3   Entered by:   Kem Parkinson   Authorized by:   Ferman Hamming, MD, [facility]   Signed by:   Kem Parkinson on 04/29/2009   Method used:   [facility]  [address] [phone][facility] (retail)       [address]       [place], [place]  [address]       Ph: [phone] or [phone]       Fax: [phone]   RxID:   [id number]   Appended Document: QUESTION ABOUT METOPROLOL          Additional Follow-up for Phone Call Additional follow up Details #2::    pt wanted to know if he is suppose to be taken metoprolol 2 tabs two times a day or 1 tab by mouth two times a day. Pts wife states they have been taken 2 tabs two times a day since hopsital visit in november but since march it states 1 tab by mouth two times a day so i left it as it was written in out documentation. Gave pt  a 3 month suppl with 1 year supply of refills Follow-up by: Kem Parkinson,  April 29, 2009 10:25 AM

## 2010-02-18 NOTE — Letter (Signed)
Summary: Cardiac Catheterization Instructions- Main Lab  [facility], Main Office  [address]   [place], [place] [address]   Phone: [phone]  Fax: [phone]     01/25/2009 MRN: [id number]  [facility] [address] [place], [place]  [address]  Dear Mr. Bottger,   You are scheduled for Cardiac Catheterization on MONDAY 01-28-09               with Dr. Excell Seltzer           .  Please arrive at the [facility] at  [facility]      on the day of your procedure.  1. DIET     _XXXX___ Nothing to eat or drink after midnight except your medications with a sip of water.  2. Come to the [place] office on             for lab work.  The lab at [facility] is open from 8:30 a.m. to 1:30 p.m. and 2:30 p.m. to 5:00 p.m.  The lab at [facility] is open from 7:30 a.m. to 5:30 p.m.  You do not have to be fasting.  3. MAKE SURE YOU TAKE YOUR ASPIRIN.  4. _____ DO NOT TAKE these medications before your procedure:         ________________________________________________________________________________      ____ YOU MAY TAKE ALL of your remaining medications with a small amount of water.      ____ START NEW medications:     ________________________________________________________________________________      ____ Eilene Ghazi instructions:     ________________________________________________________________________________  5. Plan for one night stay - bring personal belongings (i.e. toothpaste, toothbrush, etc.)  6. Bring a current list of your medications and current insurance cards.  7. Must have a responsible person to drive you home.   8. Someone must be with yu for the first 24 hours after you arrive home.  9. Please wear clothes that are easy to get on and off and wear slip-on shoes.  *Special note: Every effort is made to have your procedure done on time.  Occasionally there are emergencies that present themselves at the  hospital that may cause delays.  Please be patient if a delay does occur.  If you have any questions after you get home, please call the office at the number listed above.  Deliah Goody, RN

## 2010-02-18 NOTE — Letter (Signed)
Summary: Cardiac Rehabilitation Program  Cardiac Rehabilitation Program   Imported By: Kassie Mends 01/21/2009 07:52:19  _____________________________________________________________________  External Attachment:    Type:   Image     Comment:   External Document

## 2010-02-18 NOTE — Progress Notes (Signed)
Summary: pt needs refill  Phone Note Refill Request Call back at Home Phone [phone] Message from:  [facility] [address]  Refills Requested: Medication #1:  Nitro Initial call taken by: Omer Jack,  October 18, 2009 4:13 PM    New/Updated Medications: NITROSTAT 0.4 MG SUBL (NITROGLYCERIN) 1 tablet under tongue at onset of chest pain; you may repeat every 5 minutes for up to 3 doses. Prescriptions: NITROSTAT 0.4 MG SUBL (NITROGLYCERIN) 1 tablet under tongue at onset of chest pain; you may repeat every 5 minutes for up to 3 doses.  #25 x 12   Entered by:   Kem Parkinson   Authorized by:   Ferman Hamming, MD, [facility]   Signed by:   Kem Parkinson on 10/18/2009   Method used:   [facility]        [facility] [phone]* (retail)       [address]       [place], [place]  [id number]       Ph: [phone] or [phone]       Fax: [phone]   RxID:   [id number]

## 2010-02-18 NOTE — Assessment & Plan Note (Signed)
Summary: F6M/DM   Primary Dio Giller:  Hannah Beat MD  CC:  dizziness if pt moves to quick.  History of Present Illness: Pleasant gentleman admitted to [facility] in November, 2010 with an acute inferior myocardial infarction.  A cardiac catheterization was performed on November 11. There was a 30% left main. There was a 70-75% LAD. The circumflex was occluded. There was a 50% ostial and 50% mid right coronary artery. Ejection fraction was 50% with inferior hypokinesis. The patient had a bare-metal stent to the circumflex at that time. It was felt that he may require IVUS and PCI of the LAD electively after he recovered. He underwent repeat cardiac catheterization in January of 2011. He was found to have a 50% LAD and a 50-70% circumflex distal to the stent. He had IVUS and lesions or not felt to be flow limiting. He was therefore treated medically. He also had a followup abdominal ultrasound in January of 2011 that showed a 2.4 x 2.4 cm abdominal aortic aneurysm. Followup was recommended in 2 years. I last saw him in March of 2011. Since then the patient has dyspnea with more extreme activities but not with routine activities. It is relieved with rest. It is not associated with chest pain. There is no orthopnea, PND or pedal edema. There is no syncope or palpitations. There is no exertional chest pain.  Current Medications (verified): 1)  Multivitamins   Tabs (Multiple Vitamin) .... Qd 2)  Bufferin 325 Mg Tabs (Aspirin Buf(Cacarb-Mgcarb-Mgo)) .... Take One Daily 3)  Lutein 10 Mg  Tabs (Lutein) .... Two Times A Day 4)  Plavix 75 Mg Tabs (Clopidogrel Bisulfate) .... Take One Daily 5)  Lisinopril 2.5 Mg Tabs (Lisinopril) .... Take One Daily 6)  Metoprolol Tartrate 25 Mg Tabs (Metoprolol Tartrate) .Marland Kitchen.. 1 Tab Take Two Times A Day 7)  Crestor 40 Mg Tabs (Rosuvastatin Calcium) .... Take One Daily  Allergies: No Known Drug Allergies  Past History:  Past Medical History: Reviewed history from  08/14/2009 and no changes required. Lung CA, 2000, s/p Lobectomy, R MI, 11/2008, PCI, [facility] Peripheral vascular disease Hearing Loss Hepatitis C, hx of  Prostate cancer, hx of Varicocele-Lt coronary artery disease abdominal aortic aneurysm Allergic rhinitis Colonic polyps  Past Surgical History: Reviewed history from 08/14/2009 and no changes required. Lobectomy, 2000, right, [place] Prostate, seeds, 2000  Colonoscopy-02/26/2005  Social History: Reviewed history from 03/19/2009 and no changes required. Retired Married  tobacco abuse discontinued Alcohol use-no Drug use-no Regular exercise-yes  Review of Systems       no fevers or chills, productive cough, hemoptysis, dysphasia, odynophagia, melena, hematochezia, dysuria, hematuria, rash, seizure activity, orthopnea, PND, pedal edema, claudication. Remaining systems are negative.   Vital Signs:  Patient profile:   76 year old male Height:      70 inches Weight:      154 pounds BMI:     22.18 Pulse rate:   64 / minute Resp:     12 per minute BP sitting:   124 / 69  (left arm)  Vitals Entered By: Kem Parkinson (September 30, 2009 1:58 PM)  Physical Exam  General:  Well-developed well-nourished in no acute distress.  Skin is warm and dry.  HEENT is normal.  Neck is supple. No thyromegaly.  Chest is diminished sounds throughout Cardiovascular exam is regular rate and rhythm.  Abdominal exam nontender or distended. No masses palpated. Extremities show no edema. neuro grossly intact    EKG  Procedure date:  09/30/2009  Findings:      Sinus rhythm at a rate of 64. Right bundle branch block. Cannot rule out prior inferior infarct.  Impression & Recommendations:  Problem # 1:  HYPERLIPIDEMIA-MIXED (ICD-272.4) Continue statin. Lipids and liver monitored by primary care. His updated medication list for this problem includes:    Crestor 40 Mg Tabs (Rosuvastatin calcium) .Marland Kitchen... Take one  daily  Problem # 2:  TOBACCO ABUSE (ICD-305.1) Patient states he has discontinued for 6 weeks.  Problem # 3:  CAD, NATIVE VESSEL (ICD-414.01) Continue aspirin, Plavix, ACE inhibitor, beta blocker and statin. His updated medication list for this problem includes:    Bufferin 325 Mg Tabs (Aspirin buf(cacarb-mgcarb-mgo)) .Marland Kitchen... Take one daily    Plavix 75 Mg Tabs (Clopidogrel bisulfate) .Marland Kitchen... Take one daily    Lisinopril 2.5 Mg Tabs (Lisinopril) .Marland Kitchen... Take one daily    Metoprolol Tartrate 25 Mg Tabs (Metoprolol tartrate) .Marland Kitchen... 1 tab take two times a day  Problem # 4:  ABDOMINAL AORTIC ANEURYSM (ICD-441.4) Followup  abdominal ultrasound in January 2013.  Problem # 5:  LUNG CANCER, HX OF (ICD-V10.11)  Patient Instructions: 1)  Your physician recommends that you schedule a follow-up appointment in: ONE YEAR

## 2010-02-20 NOTE — Letter (Signed)
Summary: New Patient letter  [facility]  [address] [place], [place] [address]   Phone: [phone]  Fax: [phone]       02/14/2010 MRN: [id number]  Patrick Sampson [address] [place], [place]  [address]  Dear [name],  Welcome to the Gastroenterology Division at [facility].    You are scheduled to see Dr.  Jarold Motto on 03-20-10 at 9am on the 3rd floor at [facility], [address]  We ask that you try to arrive at our office 15 minutes prior to your appointment time to allow for check-in.  We would like you to complete the enclosed self-administered evaluation form prior to your visit and bring it with you on the day of your appointment.  We will review it with you.  Also, please bring a complete list of all your medications or, if you prefer, bring the medication bottles and we will list them.  Please bring your insurance card so that we may make a copy of it.  If your insurance requires a referral to see a specialist, please bring your referral form from your primary care physician.  Co-payments are due at the time of your visit and may be paid by cash, check or credit card.     Your office visit will consist of a consult with your physician (includes a physical exam), any laboratory testing he/she may order, scheduling of any necessary diagnostic testing (e.g. x-ray, ultrasound, CT-scan), and scheduling of a procedure (e.g. Endoscopy, Colonoscopy) if required.  Please allow enough time on your schedule to allow for any/all of these possibilities.    If you cannot keep your appointment, please call [phone] to cancel or reschedule prior to your appointment date.  This allows [place] the opportunity to schedule an appointment for another patient in need of care.  If you do not cancel or reschedule by 5 p.m. the business day prior to your appointment date, you will be charged a $50.00 late cancellation/no-show fee.    Thank you for choosing [facility] for your medical needs.  We appreciate the opportunity to care for you.  Please visit [place] at our website  to learn more about our practice.                     Sincerely,                                                             The Gastroenterology Division

## 2010-02-20 NOTE — Assessment & Plan Note (Signed)
Summary: 6 month followup/rbh   Vital Signs:  Patient profile:   76 year old male Height:      70 inches Weight:      157.25 pounds BMI:     22.64 Temp:     97.8 degrees F oral Pulse rate:   64 / minute Pulse rhythm:   regular BP sitting:   120 / 64  (left arm) Cuff size:   regular  Vitals Entered By: Benny Lennert CMA Duncan Dull) (February 12, 2010 9:25 AM)  History of Present Illness: Chief complaint 6 month follow up multiple medical issues  CAD: tol all meds, ASA, plavix, ace, b-blockade.  HTN: stable and at goal.  Lipids: at goal, reviewed with patient.  Lung CA, quit tob: CXR - h/o lung cancer. None last year.  AR: year round sinuses. Not sure what he can take with his current meds.  Clinical Review Panels:  Prevention   Last PSA:  0.02 (08/14/2009)  Immunizations   Last Tetanus Booster:  Td (07/11/2007)   Last Flu Vaccine:  State Fluvax Nasal (01/19/2005)   Last Pneumovax:  Pneumovax (07/20/2008)   Last Zoster Vaccine:  Zostavax (10/20/2006)  Lipid Management   Cholesterol:  111 (02/10/2010)   LDL (bad choesterol):  50 (02/10/2010)   HDL (good cholesterol):  41.30 (02/10/2010)  Diabetes Management   HgBA1C:  6.5 (07/08/2006)   Creatinine:  0.9 (02/10/2010)   Last Flu Vaccine:  State Fluvax Nasal (01/19/2005)   Last Pneumovax:  Pneumovax (07/20/2008)  CBC   WBC:  8.9 (02/10/2010)   RBC:  4.00 (02/10/2010)   Hgb:  14.1 (02/10/2010)   Hct:  40.7 (02/10/2010)   Platelets:  194.0 (02/10/2010)   MCV  101.7 (02/10/2010)   MCHC  34.5 (02/10/2010)   RDW  13.1 (02/10/2010)   PMN:  70.9 (02/10/2010)   Lymphs:  17.8 (02/10/2010)   Monos:  7.9 (02/10/2010)   Eosinophils:  3.1 (02/10/2010)   Basophil:  0.3 (02/10/2010)  Complete Metabolic Panel   Glucose:  115 (02/10/2010)   Sodium:  134 (02/10/2010)   Potassium:  5.0 (02/10/2010)   Chloride:  100 (02/10/2010)   CO2:  27 (02/10/2010)   BUN:  20 (02/10/2010)   Creatinine:  0.9 (02/10/2010)   Albumin:   4.3 (02/10/2010)   Total Protein:  7.2 (02/10/2010)   Calcium:  9.6 (02/10/2010)   Total Bili:  0.7 (02/10/2010)   Alk Phos:  82 (02/10/2010)   SGPT (ALT):  34 (02/10/2010)   SGOT (AST):  31 (02/10/2010)   Allergies (verified): No Known Drug Allergies  Past History:  Past medical, surgical, family and social histories (including risk factors) reviewed, and no changes noted (except as noted below).  Past Medical History: Reviewed history from 08/14/2009 and no changes required. Lung CA, 2000, s/p Lobectomy, R MI, 11/2008, PCI, [facility] Peripheral vascular disease Hearing Loss Hepatitis C, hx of  Prostate cancer, hx of Varicocele-Lt coronary artery disease abdominal aortic aneurysm Allergic rhinitis Colonic polyps  Past Surgical History: Reviewed history from 08/14/2009 and no changes required. Lobectomy, 2000, right, [place] Prostate, seeds, 2000  Colonoscopy-02/26/2005  Family History: Reviewed history from 07/11/2007 and no changes required. father is 53 from lung cancer, combination of call mining in smoking.  Mother died at 48 of congestive heart failure.  Two brothers one is reaffirmed corner disease, TB, and is on dialysis.  Other brother has had bypass surgery, coronary disease, ex-smoker.  Two sisters, both in good health  Social History: Reviewed history from 03/19/2009  and no changes required. Retired Married  tobacco abuse discontinued Alcohol use-no Drug use-no Regular exercise-yes  Review of Systems       REVIEW OF SYSTEMS GEN: No acute illnesses, no fever, chills, sweats. CV: No chest pain or SOB GI: No noted N or V Otherwise, pertinent positives and negatives are noted in the HPI.   Physical Exam  Additional Exam:  GEN: WDWN, NAD, Non-toxic, A & O x 3 HEENT: Atraumatic, Normocephalic. Neck supple. No masses, No LAD. Ears and Nose: No external deformity. CV: RRR, No M/G/R. No JVD. No thrill. No extra heart sounds. PULM: CTA B, no  wheezes, crackles, rhonchi. No retractions. No resp. distress. No accessory muscle use. EXTR: No c/c/e NEURO: Normal gait.  PSYCH: Normally interactive. Conversant. Not depressed or anxious appearing.  Calm demeanor.     Impression & Recommendations:  Problem # 1:  ALLERGIC RHINITIS (ICD-477.9) Assessment Deteriorated  Please see the patient instructions for a detailed list of plans and what was discussed with the patient.   His updated medication list for this problem includes:    Fluticasone Propionate 50 Mcg/act Susp (Fluticasone propionate) .Marland Kitchen... 2 sprays each nostril once daily  Orders: Prescription Created Electronically [phone])  Problem # 2:  CAD, NATIVE VESSEL (ICD-414.01) Assessment: Unchanged stable   His updated medication list for this problem includes:    Bufferin 325 Mg Tabs (Aspirin buf(cacarb-mgcarb-mgo)) .Marland Kitchen... Take one daily    Plavix 75 Mg Tabs (Clopidogrel bisulfate) .Marland Kitchen... Take one daily    Lisinopril 2.5 Mg Tabs (Lisinopril) .Marland Kitchen... Take one daily    Metoprolol Tartrate 25 Mg Tabs (Metoprolol tartrate) .Marland Kitchen... 1 tab take two times a day    Nitrostat 0.4 Mg Subl (Nitroglycerin) .Marland Kitchen... 1 tablet under tongue at onset of chest pain; you may repeat every 5 minutes for up to 3 doses.  Problem # 3:  LUNG CANCER, HX OF (ICD-V10.11) obtain f/u cxr  Orders: T-2 View CXR (71020TC)  Problem # 4:  HYPERLIPIDEMIA-MIXED (ICD-272.4) Assessment: Unchanged  His updated medication list for this problem includes:    Crestor 40 Mg Tabs (Rosuvastatin calcium) .Marland Kitchen... Take one daily  Problem # 5:  HYPERTENSION, ESSENTIAL NOS (ICD-401.9)  His updated medication list for this problem includes:    Lisinopril 2.5 Mg Tabs (Lisinopril) .Marland Kitchen... Take one daily    Metoprolol Tartrate 25 Mg Tabs (Metoprolol tartrate) .Marland Kitchen... 1 tab take two times a day  Complete Medication List: 1)  Multivitamins Tabs (Multiple vitamin) .... Qd 2)  Bufferin 325 Mg Tabs (Aspirin buf(cacarb-mgcarb-mgo)) ....  Take one daily 3)  Lutein 10 Mg Tabs (Lutein) .... Two times a day 4)  Plavix 75 Mg Tabs (Clopidogrel bisulfate) .... Take one daily 5)  Lisinopril 2.5 Mg Tabs (Lisinopril) .... Take one daily 6)  Metoprolol Tartrate 25 Mg Tabs (Metoprolol tartrate) .Marland Kitchen.. 1 tab take two times a day 7)  Crestor 40 Mg Tabs (Rosuvastatin calcium) .... Take one daily 8)  Nitrostat 0.4 Mg Subl (Nitroglycerin) .Marland Kitchen.. 1 tablet under tongue at onset of chest pain; you may repeat every 5 minutes for up to 3 doses. 9)  Fluticasone Propionate 50 Mcg/act Susp (Fluticasone propionate) .... 2 sprays each nostril once daily  Patient Instructions: 1)  GENERIC: CLARITIN, ZYRTEC, ALLEGRA. 2)  Call [facility]: When am I supposed to have follow-up colonoscopy? 3)  f/u 6 months Prescriptions: FLUTICASONE PROPIONATE 50 MCG/ACT  SUSP (FLUTICASONE PROPIONATE) 2 sprays each nostril once daily  #1 vial x 11   Entered and Authorized by:  Hannah Beat MD   Signed by:   Hannah Beat MD on 02/12/2010   Method used:   Electronically to        [facility]  [address] [phone]* (retail)       [address]       [place], [place]  [id number]       Ph: [phone] or [phone]       Fax: [phone]   RxID:   [id number]    Orders Added: 1)  T-2 View CXR [71020TC] 2)  Est. Patient Level IV [51761] 3)  Prescription Created Electronically [phone]    Current Allergies (reviewed today): No known allergies

## 2010-03-19 DIAGNOSIS — K521 Toxic gastroenteritis and colitis: Secondary | ICD-10-CM | POA: Insufficient documentation

## 2010-03-19 DIAGNOSIS — Z860100 Personal history of colon polyps, unspecified: Secondary | ICD-10-CM | POA: Insufficient documentation

## 2010-03-19 DIAGNOSIS — K649 Unspecified hemorrhoids: Secondary | ICD-10-CM | POA: Insufficient documentation

## 2010-03-20 ENCOUNTER — Encounter: Payer: Self-pay | Admitting: Gastroenterology

## 2010-03-20 ENCOUNTER — Encounter (INDEPENDENT_AMBULATORY_CARE_PROVIDER_SITE_OTHER): Payer: Self-pay | Admitting: *Deleted

## 2010-03-20 ENCOUNTER — Other Ambulatory Visit: Payer: Self-pay | Admitting: Gastroenterology

## 2010-03-20 ENCOUNTER — Ambulatory Visit (INDEPENDENT_AMBULATORY_CARE_PROVIDER_SITE_OTHER): Payer: Medicare Other | Admitting: Gastroenterology

## 2010-03-20 ENCOUNTER — Other Ambulatory Visit: Payer: Medicare Other

## 2010-03-20 DIAGNOSIS — Z860100 Personal history of colon polyps, unspecified: Secondary | ICD-10-CM

## 2010-03-20 DIAGNOSIS — D689 Coagulation defect, unspecified: Secondary | ICD-10-CM

## 2010-03-20 DIAGNOSIS — K5900 Constipation, unspecified: Secondary | ICD-10-CM | POA: Insufficient documentation

## 2010-03-20 LAB — IBC PANEL: Iron: 97 ug/dL (ref 42–165)

## 2010-03-21 ENCOUNTER — Telehealth: Payer: Self-pay | Admitting: Cardiology

## 2010-03-27 NOTE — Procedures (Signed)
Summary: ERCP   ERCP  Procedure date:  04/30/2002  Findings:      Location: [facility].   ERCP  Procedure date:  04/30/2002  Findings:      Location: [facility].  Patient Name: Patrick Sampson, Patrick Sampson. MRN:  Procedure Procedures: Diagnostic Endoscopic Retrograde Cholangiopancreatography CPT: 43260.  Personnel: Endoscopist: Barbette Hair. Arlyce Dice, MD.  Indications  Abnormal Exams, Studies: Abdominal US, abnormal, do not suspect malignancy.  Symptoms: Abdominal pain.  History  Pre-Exam Physical: Performed Apr 30, 2002. Entire physical exam was normal. Cardio- pulmonary exam, HEENT exam , Abdominal exam, Mental status exam WNL.  Exam Monitoring: Pulse and BP monitoring done. Oximetry used. Supplemental O2 given. at 2 Liters.  Sedation Meds: Robinul 0.2 given IV. Fentanyl 50 mcg. given aerosolized. Versed 5 mg. given IV. Cetacaine Spray 2 sprays given IV.  Fluoroscopy: Fluoroscopy was used.  VISUALIZATION EGD visualization complete. Major Papilla visualized. Minor Papilla not sought.  Ducts: Gall Bladder not sought. Common Bile Duct, Left Hepatic Duct, Right Hepatic Duct visualized. Intrahepatic DuctsPancreatic Duct visualized.  Findings - MUCOSAL ABNORMALITY: Cardia  to Antrum. Thick folds. Erythematous mucosa. Biopsy/Mucosal Abn taken. RUT done, results pending.  ICD9: Gastritis, Acute: 535.00. Comment: Edematous, diffusely reddened folds.  - MUCOSAL ABNORMALITY: Duodenal Bulb  to Duodenal Apex. Erythematous mucosa. Red spots present. Friable mucosa. Biopsy/Mucosal Abn taken. ICD9: Duodenitis without Hemorrhage: 535.60. Comment: Severe inflammatory changes in post bulbar duodenum.  OTHER FINDING: Periampullary diverticulum in Duodenal Apex.   - DUCTAL DILATION: Common Hepatic Duct to Major Duodenal Papilla,  Maximum size: 13 mm. diffuse. Comments: Diffusely enlarged duct but no obstruction, stones, strictures.  - Normal : Pancreatic Duct to Major  Duodenal Papilla.   Assessment ERCP: Exam complete, Abnormal examination, see findings above.  Biliary Tract: Exam complete. Abnormal examination, see findings above.  Pancreatic Tract: Exam complete. Normal examination.  Events  Unplanned Intervention: No intervention was required.  Unplanned Events: There were no complications. Plans Medication Plan: Await pathology. Continue current medications.   CC: Valera Castle, MD     Sheppard Plumber. Earlene Plater, MD  This report was created from the original endoscopy report, which was reviewed and signed by the above listed endoscopist.   Appended Document: ERCP Per Pathology there is not a Pathology from this procedure.

## 2010-03-27 NOTE — Letter (Addendum)
Summary: Anticoagulation Modification Letter  [facility]  [address] [place], [place] [address]   Phone: [phone]  Fax: [phone]    March 20, 2010  Re:    Patrick Sampson DOB:    07-10-1934 MRN:    [id number]    Dear Dr Olga Millers:  We have scheduled the above patient for an endoscopic procedure. Our records show that  he/she is on anticoagulation therapy. Please advise as to how long the patient may come off their therapy of Plavix prior to the scheduled procedure on 04/21/2010.   Please route the completed form to [facility], CMA (AAMA)  Thank you for your help with this matter.  Sincerely,  Harlow Mares CMA (AAMA)   Physician Recommendation:  Hold Plavix 7 days prior ________________  Hold Coumadin 5 days prior ____________  Other ______________________________     Appended Document: Anticoagulation Modification Letter ok to dc plavix completely  Appended Document: Anticoagulation Modification Letter advosed pt that he should call Dr. Jens Som about stopping Plavix Completely, I have advised him to hold his plavix 7 days prior to his test and he will call Cardiology about stopping completely.

## 2010-03-27 NOTE — Letter (Signed)
Summary: [facility] Instructions  [facility]  [address] [place], [place] [address]   Phone: [phone]  Fax: [phone]       Patrick Sampson    31-Aug-1934    MRN: [id number]        Procedure Day /Date: Monday 04/21/2010     Arrival Time: 10:30am     Procedure Time: 11:30am     Location of Procedure:                    X  [facility] (4th Floor)   PREPARATION FOR COLONOSCOPY WITH MOVIPREP   Starting 5 days prior to your procedure 04/16/2010 do not eat nuts, seeds, popcorn, corn, beans, peas,  salads, or any raw vegetables.  Do not take any fiber supplements (e.g. Metamucil, Citrucel, and Benefiber).  THE DAY BEFORE YOUR PROCEDURE         Sunday 04/20/2010  1.  Drink clear liquids the entire day-NO SOLID FOOD  2.  Do not drink anything colored red or purple.  Avoid juices with pulp.  No orange juice.  3.  Drink at least 64 oz. (8 glasses) of fluid/clear liquids during the day to prevent dehydration and help the prep work efficiently.  CLEAR LIQUIDS INCLUDE: Water Jello Ice Popsicles Tea (sugar ok, no milk/cream) Powdered fruit flavored drinks Coffee (sugar ok, no milk/cream) Gatorade Juice: apple, white grape, white cranberry  Lemonade Clear bullion, consomm, broth Carbonated beverages (any kind) Strained chicken noodle soup Hard Candy                             4.  In the morning, mix first dose of MoviPrep solution:    Empty 1 Pouch A and 1 Pouch B into the disposable container    Add lukewarm drinking water to the top line of the container. Mix to dissolve    Refrigerate (mixed solution should be used within 24 hrs)  5.  Begin drinking the prep at 5:00 p.m. The MoviPrep container is divided by 4 marks.   Every 15 minutes drink the solution down to the next mark (approximately 8 oz) until the full liter is complete.   6.  Follow completed prep with 16 oz of clear liquid of your choice (Nothing red or purple).  Continue to drink  clear liquids until bedtime.  7.  Before going to bed, mix second dose of MoviPrep solution:    Empty 1 Pouch A and 1 Pouch B into the disposable container    Add lukewarm drinking water to the top line of the container. Mix to dissolve    Refrigerate  THE DAY OF YOUR PROCEDURE      Monday 04/21/2010  Beginning at 6:30am (5 hours before procedure):         1. Every 15 minutes, drink the solution down to the next mark (approx 8 oz) until the full liter is complete.  2. Follow completed prep with 16 oz. of clear liquid of your choice.    3. You may drink clear liquids until 9:30am (2 HOURS BEFORE PROCEDURE).   MEDICATION INSTRUCTIONS  Unless otherwise instructed, you should take regular prescription medications with a small sip of water   as early as possible the morning of your procedure.  You will be contacted by our office prior to your procedure for directions on holding your Plavix.  If you do not hear from our office 1 week  prior to your scheduled procedure, please call [phone] to discuss.          OTHER INSTRUCTIONS  You will need a responsible adult at least 74 years of age to accompany you and drive you home.   This person must remain in the waiting room during your procedure.  Wear loose fitting clothing that is easily removed.  Leave jewelry and other valuables at home.  However, you may wish to bring a book to read or  an iPod/MP3 player to listen to music as you wait for your procedure to start.  Remove all body piercing jewelry and leave at home.  Total time from sign-in until discharge is approximately 2-3 hours.  You should go home directly after your procedure and rest.  You can resume normal activities the  day after your procedure.  The day of your procedure you should not:   Drive   Make legal decisions   Operate machinery   Drink alcohol   Return to work  You will receive specific instructions about eating, activities and medications  before you leave.    The above instructions have been reviewed and explained to me by   _______________________    I fully understand and can verbalize these instructions _____________________________ Date _________

## 2010-03-27 NOTE — Progress Notes (Signed)
Summary: medication question  Phone Note Call from Patient Call back at Home Phone [phone]   Caller: Patient Reason for Call: Talk to Nurse, Talk to Doctor Summary of Call: pt wants to know if he will be taken off plavix completely or is this just for his procedure today Initial call taken by: Omer Jack,  March 21, 2010 9:00 AM  Follow-up for Phone Call        Pt. called to verify that he can stop take Plavix completely. On 03/20/10 Dr. Jens Som note states "ok to d/c Plavix completely". Pt. verbalized understanding. Follow-up by: Ollen Gross, RN, BSN,  March 21, 2010 9:27 AM

## 2010-03-27 NOTE — Procedures (Signed)
Summary: Colonoscopy- Dr. Corinda Gubler   Colonoscopy  Procedure date:  02/26/2005  Findings:      Location:  [facility].   Patient Name: Patrick Sampson, Patrick Sampson. MRN:  Procedure Procedures: Colonoscopy CPT: [phone].  Personnel: Endoscopist: Ulyess Mort, MD.  Exam Location: Exam performed in Outpatient Clinic. Outpatient  Patient Consent: Procedure, Alternatives, Risks and Benefits discussed, consent obtained, from patient. Consent was obtained by the RN.  Indications  Increased Risk Screening: For family history of colorectal neoplasia, in  parent  History  Current Medications: Patient is not currently taking Coumadin.  Pre-Exam Physical: Performed Apr 30, 2002. Entire physical exam was normal. Cardio- pulmonary exam, Rectal exam, HEENT exam , Abdominal exam, Mental status exam WNL.  Comments: Pt. history reviewed/updated, physical exam performed prior to initiation of sedation? Exam Exam: Extent of exam reached: Cecum, extent intended: Cecum.  The cecum was identified by appendiceal orifice and IC valve. Colon retroflexion performed. Images taken. ASA Classification: II. Tolerance: good.  Monitoring: Pulse and BP monitoring, Oximetry used. Supplemental O2 given.  Colon Prep Prep results: good.  Sedation Meds: Patient assessed and found to be appropriate for moderate (conscious) sedation. Fentanyl given IV. Versed given IV.  Findings POLYP: Descending Colon, Maximum size: 3 mm. sessile polyp. Procedure:  hot biopsy, removed, retrieved, Polyp sent to pathology. ICD9: Colon Polyps: 211.3.  - NOT SEEN ON EXAM: Cecum to Rectum. Tumors, Diverticulosis,  ANGIODYSPLASIA (AVMs): maximum size 3 mm, Cecum. ICD9: Angiodysplasia, Intestinal: 569.85.  POLYP: Cecum, Maximum size: 9 mm. sessile polyp. Procedure:  hot biopsy, removed, retrieved, sent to pathology. ICD9: Colon Polyps: 211.3. Comments: vascular tissue.  - MUCOSAL ABNORMALITY: Rectum. Erythema present.  Friability: mild. Comments: minnimal  prob. 2nd. irradiation.  - HEMORRHOIDS: External. Size: Grade II. Bleeding. ICD9: Hemorrhoids, External: 455.3.   Assessment Abnormal examination, see findings above.  Diagnoses: 211.3: Colon Polyps.  569.85: Angiodysplasia, Intestinal.  455.6: Hemorrhoids, Internal and  External.  455.3: Hemorrhoids, External.   Events  Unplanned Interventions: No intervention was required.  Unplanned Events: There were no complications. Plans Medication Plan: Await pathology. Continue current medications.  Patient Education: Patient given standard instructions for: Polyps. Hemorrhoids. Yearly hemoccult testing recommended. Patient instructed to get routine colonoscopy every 3 years.  Disposition: After procedure patient sent to recovery. After recovery patient sent home.    CC: Kelle Darting, MD

## 2010-04-01 NOTE — Assessment & Plan Note (Signed)
Summary: RECALL COLON/ON PLAVIX/SCHED W-PT   History of Present Illness Visit Type: Follow-up Visit Primary GI MD: Sheryn Bison MD FACP FAGA Primary Provider: Hannah Beat MD Requesting Provider: na Chief Complaint: Consult colon. Pt c/o bloating, chest pain, rectal pain due to hemorrhoids, constipation and diarrhea  History of Present Illness:   76 year old Caucasian male with multiple medical problems including coronary artery disease with previous stenting in the summer of 2010, chronic anticoagulation with Plavix and aspirin, and a known stable abdominal aortic aneurysm. Is followed closely by Dr. Olga Millers and Dr. Kerin Perna.  Patient is referred today for evaluation of alternating diarrhea and constipation without rectal bleeding or abdominal pain. Patient's had previous right upper lobe lobectomy for lung carcinoma, and radiation treatments for prostate cancer with subsequent radiation proctitis. He's had numerous colonoscopies going back for many years with removal of multiple adenomatous colon polyps. He also has a sister with rectal carcinoma. He is now due for his followup colonoscopy exam.  Patient has Raynaud's phenomenon but denies acid reflux symptoms, dysphagia, or any history of hepatobiliary problems. He does no longer smoke, but uses 3 or 4 beers a day. He denies prior hepatitis or pancreatitis. Lab review shows no abnormalities except for macrocytosis probably related to EtOH use.  He has excellent exercise tolerance and walks to have mild a day. Otherwise he denies any active cardiopulmonary complaints. He does have chronic rosacea of his face.   GI Review of Systems    Reports bloating and  chest pain.      Denies abdominal pain, acid reflux, belching, dysphagia with liquids, dysphagia with solids, heartburn, loss of appetite, nausea, vomiting, vomiting blood, weight loss, and  weight gain.      Reports change in bowel habits, constipation, diarrhea,  hemorrhoids, and  rectal pain.     Denies anal fissure, black tarry stools, diverticulosis, fecal incontinence, heme positive stool, irritable bowel syndrome, jaundice, light color stool, liver problems, and  rectal bleeding.    Current Medications (verified): 1)  Multivitamins   Tabs (Multiple Vitamin) .... Qd 2)  Bufferin 325 Mg Tabs (Aspirin Buf(Cacarb-Mgcarb-Mgo)) .... Take One Daily 3)  Lutein 10 Mg  Tabs (Lutein) .... Two Times A Day 4)  Plavix 75 Mg Tabs (Clopidogrel Bisulfate) .... Take One Daily 5)  Lisinopril 2.5 Mg Tabs (Lisinopril) .... Take One Daily 6)  Metoprolol Tartrate 25 Mg Tabs (Metoprolol Tartrate) .Marland Kitchen.. 1 Tab Take Two Times A Day 7)  Crestor 40 Mg Tabs (Rosuvastatin Calcium) .... Take One Daily 8)  Nitrostat 0.4 Mg Subl (Nitroglycerin) .Marland Kitchen.. 1 Tablet Under Tongue At Onset of Chest Pain; You May Repeat Every 5 Minutes For Up To 3 Doses. 9)  Tums 500 Mg Chew (Calcium Carbonate Antacid) .... As Needed  Allergies (verified): No Known Drug Allergies  Past History:  Past medical, surgical, family and social histories (including risk factors) reviewed for relevance to current acute and chronic problems.  Past Medical History: Lung CA, 2000, s/p Lobectomy, R MI, 11/2008, PCI, [place] Peripheral vascular disease Hearing Loss Hepatitis C, hx of  Prostate cancer, hx of Varicocele-Lt coronary artery disease abdominal aortic aneurysm Allergic rhinitis Colonic polyps Adenomatous polyp  Arthritis Hemorrhoids Hyperlipidemia Hypertension  Past Surgical History: Lobectomy, 2000, right, [place] Prostate, seeds, 2000 Angioplasty/Stent  Colonoscopy-02/26/2005  Family History: Reviewed history from 07/11/2007 and no changes required. father is 31 from lung cancer, combination of call mining in smoking. Mother died at 57 of congestive heart failure. Two brothers one is reaffirmed corner  disease, TB, and is on dialysis. Other brother has had bypass surgery, coronary  disease, ex-smoker. Two sisters, both in good health No FH of Colon Cancer: Rectal Cancer: Sister   Social History: Reviewed history from 03/19/2009 and no changes required. Retired-Chemist Married Childern tobacco abuse discontinued Alcohol use- 1 daily  Drug use-no Regular exercise-yes Daily Caffeine Use: 1 daily   Review of Systems General:  Denies fever, chills, sweats, anorexia, fatigue, weakness, malaise, weight loss, and sleep disorder. CV:  Denies chest pains, angina, palpitations, syncope, dyspnea on exertion, orthopnea, PND, peripheral edema, and claudication. Resp:  Denies dyspnea at rest, dyspnea with exercise, cough, sputum, wheezing, coughing up blood, and pleurisy. GU:  Complains of urinary frequency and urinary hesitancy; denies urinary burning, blood in urine, nocturnal urination, urinary incontinence, penile discharge, genital sores, decreased libido, and erectile dysfunction. MS:  Complains of joint stiffness, joint deformity, and low back pain; Raynaud phenomenon in his hands.. Derm:  Complains of dry skin; denies rash, itching, hives, moles, warts, and unhealing ulcers. Neuro:  Denies weakness, paralysis, abnormal sensation, seizures, syncope, tremors, vertigo, transient blindness, frequent falls, frequent headaches, difficulty walking, headache, sciatica, radiculopathy other:, restless legs, memory loss, and confusion. Psych:  Denies depression, anxiety, memory loss, suicidal ideation, hallucinations, paranoia, phobia, and confusion. Endo:  Denies cold intolerance, heat intolerance, polydipsia, polyphagia, polyuria, unusual weight change, and hirsutism. Heme:  Denies bruising, bleeding, enlarged lymph nodes, and pagophagia.  Vital Signs:  Patient profile:   76 year old male Height:      70 inches Weight:      158 pounds BMI:     22.75 BSA:     1.89 Pulse rate:   60 / minute Pulse rhythm:   regular BP sitting:   132 / 76  (left arm) Cuff size:    regular  Vitals Entered By: Ok Anis CMA (March 20, 2010 9:04 AM)  Physical Exam  General:  Well developed, well nourished, no acute distress. Head:  Normocephalic and atraumatic. Eyes:  PERRLA, no icterus.exam deferred to patient's ophthalmologist.   Neck:  Supple; no masses or thyromegaly. Lungs:  Clear throughout to auscultation.decreased BS on L and decreased BS on R.   Heart:  Regular rate and rhythm; no murmurs, rubs,  or bruits. Abdomen:  Soft, nontender and nondistended. No masses, hepatosplenomegaly or hernias noted. Normal bowel sounds. Rectal:  deferred until time of colonoscopy.   Prostate:  Exam deferred until time of colonoscopy.   Msk:  Symmetrical with no gross deformities. Normal posture.Very cyanotic-appearing hands and fingers with extreme coldness. His feet appear to be unaffected. He also has rhinophymea of his facial area Pulses:  Normal pulses noted. Extremities:  No clubbing, cyanosis, edema or deformities noted. Neurologic:  Alert and  oriented x4;  grossly normal neurologically.   Impression & Recommendations:  Problem # 1:  COLONIC POLYPS, ADENOMATOUS, HX OF (ICD-V12.72) Assessment Unchanged colonoscopy scheduled his convenience. We will hold his Plavix 5 days before this procedure unless otherwise advised by cardiology. This patient appears to have excellent cardiopulmonary function at this time despite his multiple issues. He is having no symptoms from cardiac or pulmonary insufficiency or his abdominal aortic aneurysm. He is at high risk for colon cancer with his past history of multiple recurrent adenomas, and a sister with colon cancer. Colonoscopy passes also revealed radiation proctitis and scattered areas of vascular telangiectasias in his colon. Orders: Colonoscopy (Colon) TLB-B12, Serum-Total ONLY (16109-U04) TLB-Folic Acid (Folate) (82746-FOL) TLB-Ferritin (82728-FER) TLB-IBC Pnl (Iron/FE;Transferrin) (83550-IBC)  Problem # 2:  CONSTIPATION  (ICD-564.00) Assessment: Unchanged high-fiber diet as tolerated. Orders: Colonoscopy (Colon) TLB-B12, Serum-Total ONLY (47829-F62) TLB-Folic Acid (Folate) (82746-FOL) TLB-Ferritin (82728-FER) TLB-IBC Pnl (Iron/FE;Transferrin) (83550-IBC)  Problem # 3:  ENCOUNTER FOR LONG-TERM USE OF OTHER MEDICATIONS (ICD-V58.69) Assessment: Unchanged changes in Plavix administration as mentioned above for colonoscopy with cardiology consent.  Problem # 4:  LUNG CANCER, HX OF (ICD-V10.11) Assessment: Improved he had been in remission for approximately 10 years with stable chest x-ray. I have ordered an anemia profile the day because of his mild macrocytosis on his CBC.  Problem # 5:  CAD, NATIVE VESSEL (ICD-414.01) Assessment: Improved continue other medications per cardiology and primary care  Patient Instructions: 1)  Copy sent to : Hannah Beat MD & Ivor Messier, MD 2)  [facility] Patient Information Guide given to patient.  3)  Colonoscopy and Flexible Sigmoidoscopy brochure given.  4)  Please go to the basement today for your labs.  5)  Your procedure has been scheduled for 04/21/2010, please follow the seperate instructions.  6)  The medication list was reviewed and reconciled.  All changed / newly prescribed medications were explained.  A complete medication list was provided to the patient / caregiver. Prescriptions: MOVIPREP 100 GM  SOLR (PEG-KCL-NACL-NASULF-NA ASC-C) As per prep instructions.  #1 x 0   Entered by:   Harlow Mares CMA (AAMA)   Authorized by:   Mardella Layman MD [facility]   Signed by:   Mardella Layman MD [facility] on 03/20/2010   Method used:   Electronically to        [facility]  [address] [phone]* (retail)       [address]       [place], [place]  [id number]       Ph: [phone] or [phone]       Fax: [phone]   RxID:   [id number]

## 2010-04-21 ENCOUNTER — Encounter: Payer: Self-pay | Admitting: Gastroenterology

## 2010-04-21 ENCOUNTER — Other Ambulatory Visit: Payer: Self-pay | Admitting: Cardiology

## 2010-04-21 ENCOUNTER — Ambulatory Visit (AMBULATORY_SURGERY_CENTER): Payer: Medicare Other | Admitting: Gastroenterology

## 2010-04-21 VITALS — BP 125/75 | HR 68 | Temp 96.0°F | Resp 18 | Ht 72.0 in | Wt 160.0 lb

## 2010-04-21 DIAGNOSIS — Z1211 Encounter for screening for malignant neoplasm of colon: Secondary | ICD-10-CM

## 2010-04-21 DIAGNOSIS — Z800 Family history of malignant neoplasm of digestive organs: Secondary | ICD-10-CM

## 2010-04-21 DIAGNOSIS — K5730 Diverticulosis of large intestine without perforation or abscess without bleeding: Secondary | ICD-10-CM

## 2010-04-21 NOTE — Patient Instructions (Signed)
Discharger instructions given with verbal understanding. Handout on Diverticulosis given.

## 2010-04-22 ENCOUNTER — Telehealth: Payer: Self-pay

## 2010-04-22 NOTE — Telephone Encounter (Signed)

## 2010-04-23 LAB — BASIC METABOLIC PANEL
BUN: 9 mg/dL (ref 6–23)
CO2: 26 mEq/L (ref 19–32)
CO2: 28 mEq/L (ref 19–32)
Calcium: 8.9 mg/dL (ref 8.4–10.5)
Calcium: 9.1 mg/dL (ref 8.4–10.5)
Creatinine, Ser: 0.73 mg/dL (ref 0.4–1.5)
Creatinine, Ser: 0.75 mg/dL (ref 0.4–1.5)
GFR calc Af Amer: >60 mL/min (ref >60)
GFR calc Af Amer: >60 mL/min (ref >60)
GFR calc Af Amer: >60 mL/min (ref >60)
GFR calc non Af Amer: >60 mL/min (ref >60)
GFR calc non Af Amer: >60 mL/min (ref >60)
Potassium: 4.3 mEq/L (ref 3.5–5.1)
Sodium: 133 mEq/L — ABNORMAL LOW (ref 135–145)

## 2010-04-23 LAB — CBC
HCT: 36.8 % — ABNORMAL LOW (ref 39.0–52.0)
HCT: 39.8 % (ref 39.0–52.0)
MCHC: 34.7 g/dL (ref 30.0–36.0)
MCHC: 35.3 g/dL (ref 30.0–36.0)
MCHC: 35.3 g/dL (ref 30.0–36.0)
MCV: 100.5 fL — ABNORMAL HIGH (ref 78.0–100.0)
MCV: 101.6 fL — ABNORMAL HIGH (ref 78.0–100.0)
Platelets: 204 10*3/uL (ref 150–400)
Platelets: 218 10*3/uL (ref 150–400)
RBC: 3.7 MIL/uL — ABNORMAL LOW (ref 4.22–5.81)
RDW: 13.3 % (ref 11.5–15.5)
WBC: 10.9 10*3/uL — ABNORMAL HIGH (ref 4.0–10.5)

## 2010-04-23 LAB — CARDIAC PANEL(CRET KIN+CKTOT+MB+TROPI)
CK, MB: 122.4 ng/mL — ABNORMAL HIGH (ref 0.3–4.0)
CK, MB: 525.3 ng/mL — ABNORMAL HIGH (ref 0.3–4.0)
Relative Index: 13.9 — ABNORMAL HIGH (ref 0.0–2.5)
Relative Index: 19.9 — ABNORMAL HIGH (ref 0.0–2.5)
Total CK: 1593 U/L — ABNORMAL HIGH (ref 7–232)
Total CK: 879 U/L — ABNORMAL HIGH (ref 7–232)
Troponin I: 24.59 ng/mL (ref 0.00–0.06)
Troponin I: 48.96 ng/mL (ref 0.00–0.06)
Troponin I: 84.3 ng/mL (ref 0.00–0.06)

## 2010-04-23 LAB — POCT I-STAT, CHEM 8
Glucose, Bld: 138 mg/dL — ABNORMAL HIGH (ref 70–99)
HCT: 42 % (ref 39.0–52.0)
Hemoglobin: 14.3 g/dL (ref 13.0–17.0)
Potassium: 4.3 mEq/L (ref 3.5–5.1)
Sodium: 131 mEq/L — ABNORMAL LOW (ref 135–145)
TCO2: 27 mmol/L (ref 0–100)

## 2010-04-23 LAB — DIFFERENTIAL
Basophils Relative: 0 % (ref 0–1)
Eosinophils Absolute: 0 10*3/uL (ref 0.0–0.7)
Eosinophils Relative: 0 % (ref 0–5)
Neutrophils Relative %: 83 % — ABNORMAL HIGH (ref 43–77)

## 2010-04-23 LAB — POCT CARDIAC MARKERS
CKMB, poc: 49.3 ng/mL (ref 1.0–8.0)
Myoglobin, poc: 354 ng/mL (ref 12–200)

## 2010-04-23 LAB — LIPID PANEL: Cholesterol: 152 mg/dL (ref 0–200)

## 2010-05-22 ENCOUNTER — Other Ambulatory Visit: Payer: Self-pay | Admitting: Cardiology

## 2010-05-23 ENCOUNTER — Other Ambulatory Visit: Payer: Self-pay | Admitting: Cardiology

## 2010-06-06 NOTE — Op Note (Signed)
[place]  Patient:    Patrick Sampson                         MRN: [id number] Proc. Date: 01/03/99 Adm. Date:  [id number] Attending:  Monica Becton                           Operative Report  PREOPERATIVE DIAGNOSES: 1. Adenocarcinoma of the prostate gland. 2. Remote history of lung cancer.  POSTOPERATIVE DIAGNOSES: 1. Adenocarcinoma of the prostate gland. 2. Remote history of lung cancer.  OPERATION:  Transperineal radioactive seed implantation and cystoscopy.  SURGEON:  Claudette Laws, M.D.  DESCRIPTION OF PROCEDURE:  The patient was prepped and draped in the exaggerated dorsal lithotomy position with the Foley catheter in place and the [facility] catheter in the rectum which we later removed.  The ultrasound probe was positioned.  The images on the ultrasound screen were matched to the preplanning pictures.  Reference point was 2.0.  Anchor needles were placed and then I-125 needles were placed all using Vicryl strands.  They were placed according to the preplanned procedure.  At the end, there seemed to be good distribution of the seeds.  We removed the Foley catheter. Cystoscopy was performed showing no inadvertent insertion of the seeds into the  bladder and normal bladder, no tumors.  Normal ureteral orifices.  I reinserted a #16 [place] Foley catheter, hooked it to straight drain.  The patient was then taken back to the recovery room in satisfactory condition. DD:  01/03/99 TD:  01/04/99 Job: [id number]

## 2010-10-06 ENCOUNTER — Telehealth: Payer: Self-pay | Admitting: Cardiology

## 2010-10-06 NOTE — Telephone Encounter (Signed)
Pt has upcoming appt.  Does he need lab before appt?  Please call him and advise.

## 2010-10-06 NOTE — Telephone Encounter (Signed)
Left message for pt, labs checked in jan of this year. Will not need labs prior to appt Patrick Sampson

## 2010-10-14 ENCOUNTER — Ambulatory Visit (INDEPENDENT_AMBULATORY_CARE_PROVIDER_SITE_OTHER): Payer: Medicare Other | Admitting: Cardiology

## 2010-10-14 ENCOUNTER — Encounter: Payer: Self-pay | Admitting: Cardiology

## 2010-10-14 VITALS — BP 134/74 | HR 69 | Ht 71.0 in | Wt 156.1 lb

## 2010-10-14 DIAGNOSIS — I10 Essential (primary) hypertension: Secondary | ICD-10-CM

## 2010-10-14 DIAGNOSIS — E785 Hyperlipidemia, unspecified: Secondary | ICD-10-CM

## 2010-10-14 DIAGNOSIS — I714 Abdominal aortic aneurysm, without rupture: Secondary | ICD-10-CM

## 2010-10-14 DIAGNOSIS — I2510 Atherosclerotic heart disease of native coronary artery without angina pectoris: Secondary | ICD-10-CM

## 2010-10-14 NOTE — Assessment & Plan Note (Signed)
Continue statin. Lipids and liver monitored by primary care. 

## 2010-10-14 NOTE — Assessment & Plan Note (Signed)
Continue aspirin, statin. Plan Myoview when he returns in one year.

## 2010-10-14 NOTE — Progress Notes (Signed)
WUJ:WJXBJYNW gentleman admitted to [facility] in November, 2010 with an acute inferior myocardial infarction.  A cardiac catheterization was performed on November 11. There was a 30% left main. There was a 70-75% LAD. The circumflex was occluded. There was a 50% ostial and 50% mid right coronary artery. Ejection fraction was 50% with inferior hypokinesis. The patient had a bare-metal stent to the circumflex at that time. It was felt that he may require IVUS and PCI of the LAD electively after he recovered. He underwent repeat cardiac catheterization in January of 2011. He was found to have a 50% LAD and a 50-70% circumflex distal to the stent. He had IVUS and lesions or not felt to be flow limiting. He was therefore treated medically. He also had a followup abdominal ultrasound in January of 2011 that showed a 2.4 x 2.4 cm abdominal aortic aneurysm. Followup was recommended in 2 years. I last saw him in Sept of 2011. Since then the patient has dyspnea with more extreme activities but not with routine activities. It is relieved with rest. It is not associated with chest pain. There is no orthopnea, PND or pedal edema. There is no syncope or palpitations. There is no exertional chest pain.   Current Outpatient Prescriptions  Medication Sig Dispense Refill  . aspirin 325 MG buffered tablet Take 325 mg by mouth daily.        . CRESTOR 40 MG tablet TAKE 1 TABLET EVERY DAY  90 tablet  3  . lisinopril (PRINIVIL,ZESTRIL) 2.5 MG tablet TAKE 1 TABLET EVERY DAY  90 tablet  3  . Lutein 10 MG TABS Take 10 mg by mouth 2 (two) times daily.        Marland Kitchen METOPROLOL TARTRATE PO Take 25 mg by mouth 2 (two) times daily.        . Multiple Vitamin (MULTIVITAMIN) capsule Take 1 capsule by mouth every other day.           Past Medical History  Diagnosis Date  . Cancer 2000    lung  . Prostate cancer   . Myocardial infarction 11/2008  . Allergy   . Arthritis   . Cataract   . Hyperlipidemia   . Raynauds disease   .  Tinnitus of both ears   . CAD (coronary artery disease)   . AAA (abdominal aortic aneurysm)     Past Surgical History  Procedure Date  . Angioplasty / stenting femoral   . Lung cancer surgery     top rt lung removal  . Prostate surgery     for cancer,rad.with seed plantation  . Polypectomy   . Colonoscopy 2007    History   Social History  . Marital Status: Married    Spouse Name: N/A    Number of Children: N/A  . Years of Education: N/A   Occupational History  . Not on file.   Social History Main Topics  . Smoking status: Former Smoker    Quit date: 01/20/2007  . Smokeless tobacco: Not on file  . Alcohol Use: 6.0 oz/week    10 Cans of beer per week  . Drug Use: No  . Sexually Active:    Other Topics Concern  . Not on file   Social History Narrative  . No narrative on file    ROS: no fevers or chills, productive cough, hemoptysis, dysphasia, odynophagia, melena, hematochezia, dysuria, hematuria, rash, seizure activity, orthopnea, PND, pedal edema, claudication. Remaining systems are negative.  Physical Exam: Well-developed well-nourished in  no acute distress.  Skin is warm and dry.  HEENT is normal.  Neck is supple. No thyromegaly.  Chest is clear to auscultation with normal expansion.  Cardiovascular exam is regular rate and rhythm.  Abdominal exam nontender or distended. No masses palpated. Extremities show no edema. neuro grossly intact  ECG NSR, RBBB, LPFB

## 2010-10-14 NOTE — Assessment & Plan Note (Signed)
Plan followup ultrasound in January 2013.

## 2010-10-14 NOTE — Assessment & Plan Note (Signed)
Blood pressure controlled with present medications. Potassium and renal function monitored by daycare.

## 2010-10-14 NOTE — Patient Instructions (Signed)
Your physician wants you to follow-up in: one year You will receive a reminder letter in the mail two months in advance. If you don't receive a letter, please call our office to schedule the follow-up appointment.  

## 2011-02-09 ENCOUNTER — Other Ambulatory Visit: Payer: Self-pay | Admitting: Cardiology

## 2011-02-09 DIAGNOSIS — I714 Abdominal aortic aneurysm, without rupture: Secondary | ICD-10-CM

## 2011-02-11 ENCOUNTER — Other Ambulatory Visit: Payer: Self-pay | Admitting: Family Medicine

## 2011-02-11 ENCOUNTER — Encounter (INDEPENDENT_AMBULATORY_CARE_PROVIDER_SITE_OTHER): Payer: Medicare Other | Admitting: Cardiology

## 2011-02-11 DIAGNOSIS — E785 Hyperlipidemia, unspecified: Secondary | ICD-10-CM

## 2011-02-11 DIAGNOSIS — I700 Atherosclerosis of aorta: Secondary | ICD-10-CM | POA: Diagnosis not present

## 2011-02-11 DIAGNOSIS — I714 Abdominal aortic aneurysm, without rupture, unspecified: Secondary | ICD-10-CM

## 2011-02-11 DIAGNOSIS — Z79899 Other long term (current) drug therapy: Secondary | ICD-10-CM

## 2011-02-11 DIAGNOSIS — Z8546 Personal history of malignant neoplasm of prostate: Secondary | ICD-10-CM

## 2011-02-13 ENCOUNTER — Other Ambulatory Visit: Payer: Medicare Other

## 2011-02-13 ENCOUNTER — Other Ambulatory Visit (INDEPENDENT_AMBULATORY_CARE_PROVIDER_SITE_OTHER): Payer: Medicare Other

## 2011-02-13 DIAGNOSIS — E785 Hyperlipidemia, unspecified: Secondary | ICD-10-CM

## 2011-02-13 DIAGNOSIS — Z79899 Other long term (current) drug therapy: Secondary | ICD-10-CM

## 2011-02-13 DIAGNOSIS — Z8546 Personal history of malignant neoplasm of prostate: Secondary | ICD-10-CM

## 2011-02-13 LAB — LIPID PANEL
HDL: 40.4 mg/dL (ref >39.00)
Total CHOL/HDL Ratio: 2
Triglycerides: 41 mg/dL (ref 0.0–149.0)

## 2011-02-13 LAB — CBC WITH DIFFERENTIAL/PLATELET
Basophils Relative: 0.5 % (ref 0.0–3.0)
Eosinophils Relative: 1.8 % (ref 0.0–5.0)
HCT: 37.8 % — ABNORMAL LOW (ref 39.0–52.0)
Hemoglobin: 13.1 g/dL (ref 13.0–17.0)
Lymphs Abs: 1.5 10*3/uL (ref 0.7–4.0)
Monocytes Relative: 10.8 % (ref 3.0–12.0)
Neutro Abs: 6.6 10*3/uL (ref 1.4–7.7)
WBC: 9.3 10*3/uL (ref 4.5–10.5)

## 2011-02-13 LAB — COMPREHENSIVE METABOLIC PANEL
ALT: 23 U/L (ref 0–53)
CO2: 28 mEq/L (ref 19–32)
Creatinine, Ser: 0.8 mg/dL (ref 0.4–1.5)
GFR: 94.91 mL/min (ref >60.00)
Glucose, Bld: 109 mg/dL — ABNORMAL HIGH (ref 70–99)
Total Bilirubin: 0.8 mg/dL (ref 0.3–1.2)

## 2011-02-18 ENCOUNTER — Ambulatory Visit (INDEPENDENT_AMBULATORY_CARE_PROVIDER_SITE_OTHER): Payer: Medicare Other | Admitting: Family Medicine

## 2011-02-18 ENCOUNTER — Encounter: Payer: Self-pay | Admitting: Family Medicine

## 2011-02-18 VITALS — BP 120/78 | HR 61 | Temp 97.5°F | Ht 72.0 in | Wt 158.1 lb

## 2011-02-18 DIAGNOSIS — E785 Hyperlipidemia, unspecified: Secondary | ICD-10-CM

## 2011-02-18 DIAGNOSIS — I2510 Atherosclerotic heart disease of native coronary artery without angina pectoris: Secondary | ICD-10-CM | POA: Diagnosis not present

## 2011-02-18 DIAGNOSIS — Z85118 Personal history of other malignant neoplasm of bronchus and lung: Secondary | ICD-10-CM

## 2011-02-18 DIAGNOSIS — Z0000 Encounter for general adult medical examination without abnormal findings: Secondary | ICD-10-CM | POA: Diagnosis not present

## 2011-02-18 DIAGNOSIS — I10 Essential (primary) hypertension: Secondary | ICD-10-CM | POA: Diagnosis not present

## 2011-02-18 DIAGNOSIS — Z8546 Personal history of malignant neoplasm of prostate: Secondary | ICD-10-CM

## 2011-02-18 NOTE — Progress Notes (Signed)
Patient Name: Patrick Sampson Date of Birth: 1934/01/22 Medical Record Number: [id number] Gender: male Date of Encounter: 02/18/2011  History of Present Illness:  Patrick Sampson is a 77 y.o. very pleasant male patient who presents with the following:  Medicare CPX and f/u medical problems:  Preventative Health Maintenance Visit:  Health Maintenance Summary Reviewed and updated, unless pt declines services.  Tobacco History Reviewed. Alcohol: No concerns, no excessive use Exercise Habits: working out 4-5 days a week STD concerns: no risk or activity to increase risk Drug Use: None Encouraged self-testicular check  Health Maintenance  Topic Date Due  . Influenza Vaccine  10/20/2010  . Colonoscopy  04/21/2015  . Tetanus/tdap  07/10/2017  . Pneumococcal Polysaccharide Vaccine Age 72 And Over  Completed  . Zostavax  Completed   Labs reviewed with the patient.   Lipids:    Component Value Date/Time   CHOL 91 02/13/2011 0914   TRIG 41.0 02/13/2011 0914   HDL 40.40 02/13/2011 0914   LDLDIRECT 50.8 08/14/2009 1057   VLDL 8.2 02/13/2011 0914   CHOLHDL 2 02/13/2011 0914    CBC:    Component Value Date/Time   WBC 9.3 02/13/2011 0914   HGB 13.1 02/13/2011 0914   HCT 37.8* 02/13/2011 0914   PLT 184.0 02/13/2011 0914   MCV 101.0* 02/13/2011 0914   NEUTROABS 6.6 02/13/2011 0914   LYMPHSABS 1.5 02/13/2011 0914   MONOABS 1.0 02/13/2011 0914   EOSABS 0.2 02/13/2011 0914   BASOSABS 0.0 02/13/2011 0914    Basic Metabolic Panel:    Component Value Date/Time   NA 135 02/13/2011 0914   K 5.2* 02/13/2011 0914   CL 101 02/13/2011 0914   CO2 28 02/13/2011 0914   BUN 17 02/13/2011 0914   CREATININE 0.8 02/13/2011 0914   GLUCOSE 109* 02/13/2011 0914   CALCIUM 9.2 02/13/2011 0914    Lab Results  Component Value Date   ALT 23 02/13/2011   AST 20 02/13/2011   ALKPHOS 79 02/13/2011   BILITOT 0.8 02/13/2011    Lab Results  Component Value Date   PSA 0.02* 02/13/2011   PSA 0.02* 08/14/2009   PSA 0.04*  07/20/2008    F/u Prostate CA - PSA 0.02, s/p seeds  Lipids: Doing well, stable. Tolerating meds fine with no SE. Panel reviewed with patient.  Lipids:    Component Value Date/Time   CHOL 91 02/13/2011 0914   TRIG 41.0 02/13/2011 0914   HDL 40.40 02/13/2011 0914   LDLDIRECT 50.8 08/14/2009 1057   VLDL 8.2 02/13/2011 0914   CHOLHDL 2 02/13/2011 0914    Lab Results  Component Value Date   ALT 23 02/13/2011   AST 20 02/13/2011   ALKPHOS 79 02/13/2011   BILITOT 0.8 02/13/2011    HTN: Tolerating all medications without side effects Stable and at goal No CP, no sob. No HA.  BP Readings from Last 3 Encounters:  02/18/11 120/78  10/14/10 134/74  04/21/10 125/75    Basic Metabolic Panel:    Component Value Date/Time   NA 135 02/13/2011 0914   K 5.2* 02/13/2011 0914   CL 101 02/13/2011 0914   CO2 28 02/13/2011 0914   BUN 17 02/13/2011 0914   CREATININE 0.8 02/13/2011 0914   GLUCOSE 109* 02/13/2011 0914   CALCIUM 9.2 02/13/2011 0914   CAD, stable on ASA, statin, ace, b-blocker. No chest pain.   Patient Active Problem List  Diagnoses  . HEPATITIS C  . HYPERLIPIDEMIA-MIXED  . TOBACCO ABUSE  .  CAD, NATIVE VESSEL  . ABDOMINAL AORTIC ANEURYSM  . PERIPHERAL VASCULAR DISEASE  . ALLERGIC RHINITIS  . ABDOMINAL BRUIT  . LUNG CANCER, HX OF  . PROSTATE CANCER, HX OF  . HYPERTENSION, ESSENTIAL NOS  . COAGULOPATHY  . HEMORRHOIDS  . COLONIC POLYPS, ADENOMATOUS, HX OF   Past Medical History  Diagnosis Date  . Cancer 2000    lung  . Prostate cancer   . Myocardial infarction 11/2008  . Allergy   . Arthritis   . Cataract   . Hyperlipidemia   . Raynauds disease   . Tinnitus of both ears   . CAD (coronary artery disease)   . AAA (abdominal aortic aneurysm)    Past Surgical History  Procedure Date  . Angioplasty / stenting femoral   . Lung cancer surgery     top rt lung removal  . Prostate surgery     for cancer,rad.with seed plantation  . Polypectomy   . Colonoscopy 2007    History  Substance Use Topics  . Smoking status: Former Smoker    Quit date: 01/20/2007  . Smokeless tobacco: Not on file  . Alcohol Use: 6.0 oz/week    10 Cans of beer per week   Family History  Problem Relation Age of Onset  . Colon cancer Sister 37    rectal cancer   No Known Allergies  Medication list has been reviewed and updated.  Review of Systems:  General: Denies fever, chills, sweats. No significant weight loss. Eyes: Denies blurring,significant itching ENT: Denies earache, sore throat, and hoarseness. Cardiovascular: Denies chest pains, palpitations, dyspnea on exertion Respiratory: Denies cough, dyspnea at rest,wheeezing Breast: no concerns about lumps GI: Denies nausea, vomiting, diarrhea, constipation, change in bowel habits, abdominal pain, melena, hematochezia GU: Denies penile discharge, ED, urinary flow / outflow problems. No STD concerns. Musculoskeletal: Denies back pain, joint pain Derm: Denies rash, itching Neuro: Denies  paresthesias, frequent falls, frequent headaches Psych: Denies depression, anxiety Endocrine: Denies cold intolerance, heat intolerance, polydipsia Heme: Denies enlarged lymph nodes Allergy: No hayfever   Physical Examination: Filed Vitals:   02/18/11 1439  BP: 120/78  Pulse: 61  Temp: 97.5 F (36.4 C)  TempSrc: Oral  Height: 6' (1.829 m)  Weight: 158 lb 1.9 oz (71.723 kg)  SpO2: 99%    Body mass index is 21.44 kg/(m^2).   Wt Readings from Last 3 Encounters:  02/18/11 158 lb 1.9 oz (71.723 kg)  10/14/10 156 lb 1.9 oz (70.816 kg)  04/21/10 160 lb (72.576 kg)    GEN: well developed, well nourished, no acute distress Eyes: conjunctiva and lids normal, PERRLA, EOMI ENT: TM clear, nares clear, oral exam WNL Neck: supple, no lymphadenopathy, no thyromegaly, no JVD Pulm: clear to auscultation and percussion, respiratory effort normal CV: regular rate and rhythm, S1-S2, no murmur, rub or gallop, no bruits, peripheral  pulses normal and symmetric, no cyanosis, clubbing, edema or varicosities Chest: no scars, masses GI: soft, non-tender; no hepatosplenomegaly, masses; active bowel sounds all quadrants GU: no hernia, testicular mass, penile discharge. Lymph: no cervical, axillary or inguinal adenopathy MSK: gait normal, muscle tone and strength WNL, no joint swelling, effusions, discoloration, crepitus  SKIN: clear, good turgor, color WNL, no rashes, lesions, or ulcerations Neuro: normal mental status, normal strength, sensation, and motion Psych: alert; oriented to person, place and time, normally interactive and not anxious or depressed in appearance.   Assessment and Plan: 1. Routine general medical examination at a health care facility   2. HYPERLIPIDEMIA-MIXED  3. CAD, NATIVE VESSEL   4. LUNG CANCER, HX OF   5. PROSTATE CANCER, HX OF   6. HYPERTENSION, ESSENTIAL NOS     I have personally reviewed the Medicare Annual Wellness questionnaire and have noted 1. The patient's medical and social history 2. Their use of alcohol, tobacco or illicit drugs 3. Their current medications and supplements 4. The patient's functional ability including ADL's, fall risks, home safety risks and hearing or visual             impairment. 5. Diet and physical activities 6. Evidence for depression or mood disorders  The patients weight, height, BMI and visual acuity have been recorded in the chart I have made referrals, counseling and provided education to the patient based review of the above and I have provided the pt with a written personalized care plan for preventive services.  I have provided the patient with a copy of your personalized plan for preventive services. Instructed to take the time to review along with their updated medication list.    All other chronic disease states stable, doing well. Continue exercise. Reviewed all relevant labs with him and long term POC

## 2011-03-16 DIAGNOSIS — H2510 Age-related nuclear cataract, unspecified eye: Secondary | ICD-10-CM | POA: Diagnosis not present

## 2011-03-16 DIAGNOSIS — H353190 Nonexudative age-related macular degeneration, unspecified eye, stage unspecified: Secondary | ICD-10-CM | POA: Diagnosis not present

## 2011-04-14 ENCOUNTER — Other Ambulatory Visit: Payer: Self-pay | Admitting: Cardiology

## 2011-05-16 ENCOUNTER — Other Ambulatory Visit: Payer: Self-pay | Admitting: Cardiology

## 2011-06-02 ENCOUNTER — Telehealth: Payer: Self-pay

## 2011-06-02 ENCOUNTER — Ambulatory Visit (INDEPENDENT_AMBULATORY_CARE_PROVIDER_SITE_OTHER): Payer: Medicare Other | Admitting: Family Medicine

## 2011-06-02 ENCOUNTER — Encounter: Payer: Self-pay | Admitting: Family Medicine

## 2011-06-02 VITALS — BP 128/72 | HR 64 | Temp 98.9°F | Ht 72.0 in | Wt 153.5 lb

## 2011-06-02 DIAGNOSIS — J440 Chronic obstructive pulmonary disease with acute lower respiratory infection: Secondary | ICD-10-CM | POA: Diagnosis not present

## 2011-06-02 DIAGNOSIS — J209 Acute bronchitis, unspecified: Secondary | ICD-10-CM

## 2011-06-02 MED ORDER — ALBUTEROL SULFATE HFA 108 (90 BASE) MCG/ACT IN AERS: 2.0000 | INHALATION_SPRAY | Freq: Four times a day (QID) | RESPIRATORY_TRACT | Status: DC | PRN

## 2011-06-02 MED ORDER — AZITHROMYCIN 250 MG PO TABS: ORAL_TABLET | ORAL | Status: AC

## 2011-06-02 MED ORDER — GUAIFENESIN-CODEINE 100-10 MG/5ML PO SYRP: 5.0000 mL | ORAL_SOLUTION | Freq: Four times a day (QID) | ORAL | Status: AC | PRN

## 2011-06-02 NOTE — Progress Notes (Signed)
Subjective:    Patient ID: Patrick Sampson, male    DOB: 1934/05/05, 77 y.o.   MRN: [id number]  HPI Is here for cough and sinus problems  Last week - post nasal drip and ST and cough that has become severe  Even vomited once from coughing   Yesterday tried claritin and robitussin - helped a bit   Phlegm - from chest - is clear  Also clear nasal drainage  No ha or sinus pain  Ears feel the same - they always seem clogged and has chronic tinnitis  Quit smoking in 2009 after many years   No sick contacts  Some pollen allergies known  Has been working outdoors   Some wheezing at times -- and also has emphysema - not too bad    Patient Active Problem List  Diagnoses  . HEPATITIS C  . HYPERLIPIDEMIA-MIXED  . Former cigarette smoker  . CAD, NATIVE VESSEL  . ABDOMINAL AORTIC ANEURYSM  . PERIPHERAL VASCULAR DISEASE  . ALLERGIC RHINITIS  . ABDOMINAL BRUIT  . LUNG CANCER, HX OF  . PROSTATE CANCER, HX OF  . HYPERTENSION, ESSENTIAL NOS  . COAGULOPATHY  . HEMORRHOIDS  . COLONIC POLYPS, ADENOMATOUS, HX OF  . Acute bronchitis with COPD   Past Medical History  Diagnosis Date  . Cancer 2000    lung  . Prostate cancer   . Myocardial infarction 11/2008  . Allergy   . Arthritis   . Cataract   . Hyperlipidemia   . Raynauds disease   . Tinnitus of both ears   . CAD (coronary artery disease)   . AAA (abdominal aortic aneurysm)    Past Surgical History  Procedure Date  . Angioplasty / stenting femoral   . Lung cancer surgery     top rt lung removal  . Prostate surgery     for cancer,rad.with seed plantation  . Polypectomy   . Colonoscopy 2007   History  Substance Use Topics  . Smoking status: Former Smoker    Quit date: 01/20/2007  . Smokeless tobacco: Not on file  . Alcohol Use: 6.0 oz/week    10 Cans of beer per week   Family History  Problem Relation Age of Onset  . Colon cancer Sister 64    rectal cancer   Allergies  Allergen Reactions  . Amoxicillin Rash     Current Outpatient Prescriptions on File Prior to Visit  Medication Sig Dispense Refill  . aspirin 325 MG buffered tablet Take 325 mg by mouth daily.        . CRESTOR 40 MG tablet TAKE 1 TABLET EVERY DAY  90 tablet  3  . lisinopril (PRINIVIL,ZESTRIL) 2.5 MG tablet TAKE 1 TABLET EVERY DAY  90 tablet  3  . Lutein 10 MG TABS Take 10 mg by mouth 2 (two) times daily.        . metoprolol tartrate (LOPRESSOR) 25 MG tablet TAKE 1 TABLET BY MOUTH TWICE A DAY  180 tablet  3  . Multiple Vitamin (MULTIVITAMIN) capsule Take 1 capsule by mouth every other day.        . albuterol (PROVENTIL HFA;VENTOLIN HFA) 108 (90 BASE) MCG/ACT inhaler Inhale 2 puffs into the lungs every 6 (six) hours as needed for wheezing.  1 Inhaler  0  . DISCONTD: METOPROLOL TARTRATE PO Take 25 mg by mouth 2 (two) times daily.              Review of Systems Review of Systems  Constitutional: Negative  for fever, appetite change, fatigue and unexpected weight change.  Eyes: Negative for pain and visual disturbance.  ENT pos for runny and stuffy nose without sinus pain  Respiratory: Negative for sob/ pos for wheeze and cough    Cardiovascular: Negative for cp or palpitations    Gastrointestinal: Negative for nausea, diarrhea and constipation.  Genitourinary: Negative for urgency and frequency.  Skin: Negative for pallor or rash   Neurological: Negative for weakness, light-headedness, numbness and headaches.  Hematological: Negative for adenopathy. Does not bruise/bleed easily.  Psychiatric/Behavioral: Negative for dysphoric mood. The patient is not nervous/anxious.         Objective:   Physical Exam  Constitutional: He appears well-developed and well-nourished. No distress.  HENT:  Head: Normocephalic and atraumatic.  Right Ear: External ear normal.  Left Ear: External ear normal.  Mouth/Throat: Oropharynx is clear and moist. No oropharyngeal exudate.       Nares boggy with clear rhinorrhea  No sinus tenderness    Eyes: Conjunctivae and EOM are normal. Right eye exhibits no discharge. Left eye exhibits no discharge.  Neck: Normal range of motion. Neck supple. No thyromegaly present.  Cardiovascular: Normal rate and regular rhythm.   Pulmonary/Chest: Effort normal. No respiratory distress. He has wheezes. He has no rales. He exhibits no tenderness.       Diffusely distant bs  Harsh bs at bases Few rhonchi/ no rales  Wheeze on forced exp only  Lymphadenopathy:    He has no cervical adenopathy.  Neurological: He is alert.  Skin: Skin is warm and dry. No rash noted.  Psychiatric: He has a normal mood and affect.          Assessment & Plan:

## 2011-06-02 NOTE — Patient Instructions (Signed)
I think you have bronchitis  Drink lots of fluids and get rest Use inhaler if needed for wheezing or chest tightness  Take the zithromax as directed  Use cough syrup with caution because it will sedate Update if not starting to improve in a week or if worsening

## 2011-06-02 NOTE — Assessment & Plan Note (Signed)
With mild bronchospasm in former smoker Cover with zithromax guifen- ac for cough prn Albuterol hfa inhaler- inst in use prn Disc symptomatic care - see instructions on AVS  Update if not starting to improve in a week or if worsening

## 2011-06-02 NOTE — Telephone Encounter (Signed)
Caller: Tydarius/Patient; PCP: Juleen China.; CB#: [phone];Call regarding Cough/Congestion; Onset 05/30/11.  See in 4 hours per Cough protocol.  No appointments show in Epic.  Checked with Jamesetta So at office for assistance.  Per  Rena at office; appt. at 3 pm with Dr.Tower. Caller informed.  Home care for the interim and parameters for callback given.

## 2011-06-02 NOTE — Telephone Encounter (Signed)
Amy with [facility] said pt has non productive cough, pt has hx of heart problem, no wheezing, no trouble breathing, no fever.[facility] protocol pt needs be seen within 4 hours. Nikki said Dr Milinda Antis can see today at 3pm. Amy will notify pt.

## 2011-10-29 DIAGNOSIS — Z23 Encounter for immunization: Secondary | ICD-10-CM | POA: Diagnosis not present

## 2012-02-16 ENCOUNTER — Other Ambulatory Visit: Payer: Self-pay | Admitting: Family Medicine

## 2012-02-16 DIAGNOSIS — Z8546 Personal history of malignant neoplasm of prostate: Secondary | ICD-10-CM

## 2012-02-16 DIAGNOSIS — E785 Hyperlipidemia, unspecified: Secondary | ICD-10-CM

## 2012-02-16 DIAGNOSIS — Z79899 Other long term (current) drug therapy: Secondary | ICD-10-CM

## 2012-02-17 ENCOUNTER — Other Ambulatory Visit: Payer: Medicare Other

## 2012-02-19 ENCOUNTER — Other Ambulatory Visit (INDEPENDENT_AMBULATORY_CARE_PROVIDER_SITE_OTHER): Payer: Medicare Other

## 2012-02-19 DIAGNOSIS — Z79899 Other long term (current) drug therapy: Secondary | ICD-10-CM

## 2012-02-19 DIAGNOSIS — E785 Hyperlipidemia, unspecified: Secondary | ICD-10-CM

## 2012-02-19 DIAGNOSIS — Z8546 Personal history of malignant neoplasm of prostate: Secondary | ICD-10-CM

## 2012-02-19 LAB — BASIC METABOLIC PANEL
BUN: 22 mg/dL (ref 6–23)
CO2: 27 mEq/L (ref 19–32)
GFR: 97.32 mL/min (ref >60.00)
Glucose, Bld: 118 mg/dL — ABNORMAL HIGH (ref 70–99)
Potassium: 4.9 mEq/L (ref 3.5–5.1)

## 2012-02-19 LAB — CBC WITH DIFFERENTIAL/PLATELET
Eosinophils Relative: 3.1 % (ref 0.0–5.0)
MCV: 100.2 fl — ABNORMAL HIGH (ref 78.0–100.0)
Monocytes Absolute: 0.8 10*3/uL (ref 0.1–1.0)
Monocytes Relative: 10.1 % (ref 3.0–12.0)
Neutrophils Relative %: 68.1 % (ref 43.0–77.0)
Platelets: 220 10*3/uL (ref 150.0–400.0)
WBC: 8.4 10*3/uL (ref 4.5–10.5)

## 2012-02-19 LAB — LIPID PANEL
HDL: 41.5 mg/dL (ref >39.00)
Total CHOL/HDL Ratio: 3
VLDL: 19 mg/dL (ref 0.0–40.0)

## 2012-02-19 LAB — HEPATIC FUNCTION PANEL
ALT: 28 U/L (ref 0–53)
Bilirubin, Direct: 0.2 mg/dL (ref 0.0–0.3)
Total Bilirubin: 0.9 mg/dL (ref 0.3–1.2)

## 2012-02-24 ENCOUNTER — Ambulatory Visit (INDEPENDENT_AMBULATORY_CARE_PROVIDER_SITE_OTHER): Payer: Medicare Other | Admitting: Family Medicine

## 2012-02-24 ENCOUNTER — Encounter: Payer: Self-pay | Admitting: Family Medicine

## 2012-02-24 ENCOUNTER — Ambulatory Visit (INDEPENDENT_AMBULATORY_CARE_PROVIDER_SITE_OTHER)
Admission: RE | Admit: 2012-02-24 | Discharge: 2012-02-24 | Disposition: A | Discharge status: Home / Self Care | Payer: Medicare Other | Source: Ambulatory Visit | Attending: Family Medicine | Admitting: Family Medicine

## 2012-02-24 VITALS — BP 120/82 | HR 72 | Temp 97.6°F | Ht 72.0 in | Wt 150.5 lb

## 2012-02-24 DIAGNOSIS — Z860100 Personal history of colon polyps, unspecified: Secondary | ICD-10-CM

## 2012-02-24 DIAGNOSIS — I714 Abdominal aortic aneurysm, without rupture, unspecified: Secondary | ICD-10-CM

## 2012-02-24 DIAGNOSIS — Z125 Encounter for screening for malignant neoplasm of prostate: Secondary | ICD-10-CM

## 2012-02-24 DIAGNOSIS — Z87891 Personal history of nicotine dependence: Secondary | ICD-10-CM

## 2012-02-24 DIAGNOSIS — I2510 Atherosclerotic heart disease of native coronary artery without angina pectoris: Secondary | ICD-10-CM

## 2012-02-24 DIAGNOSIS — Z8546 Personal history of malignant neoplasm of prostate: Secondary | ICD-10-CM | POA: Diagnosis not present

## 2012-02-24 DIAGNOSIS — Z0000 Encounter for general adult medical examination without abnormal findings: Secondary | ICD-10-CM | POA: Diagnosis not present

## 2012-02-24 DIAGNOSIS — J438 Other emphysema: Secondary | ICD-10-CM | POA: Diagnosis not present

## 2012-02-24 DIAGNOSIS — Z85118 Personal history of other malignant neoplasm of bronchus and lung: Secondary | ICD-10-CM

## 2012-02-24 DIAGNOSIS — E785 Hyperlipidemia, unspecified: Secondary | ICD-10-CM | POA: Diagnosis not present

## 2012-02-24 DIAGNOSIS — J439 Emphysema, unspecified: Secondary | ICD-10-CM | POA: Insufficient documentation

## 2012-02-24 DIAGNOSIS — I739 Peripheral vascular disease, unspecified: Secondary | ICD-10-CM

## 2012-02-24 DIAGNOSIS — I10 Essential (primary) hypertension: Secondary | ICD-10-CM | POA: Diagnosis not present

## 2012-02-24 MED ORDER — ALBUTEROL SULFATE HFA 108 (90 BASE) MCG/ACT IN AERS: 2.0000 | INHALATION_SPRAY | Freq: Four times a day (QID) | RESPIRATORY_TRACT | Status: DC | PRN

## 2012-02-24 NOTE — Progress Notes (Signed)
Nature conservation officer at [facility] [address] [place] [address] Phone: [phone] Fax: [phone]  Date:  02/24/2012   Name:  Patrick Sampson   DOB:  1934/03/23   MRN:  [id number] Gender: male Age: 78 y.o.  Primary Physician:  Hannah Beat, MD  Evaluating MD: Hannah Beat, MD   Chief Complaint: Annual Exam   History of Present Illness:  Patrick Sampson is a 78 y.o. pleasant patient with a history of Lung Cancer distantly in 2000, h/o prostate cancer, h/o CAD s/p MI, h/o AAA last checked 01/2011, h/o Hep C, HTN, and hyperlipidemia who presents with the following:  Medicare Wellness and f/u multiple med problems:  Preventative Health Maintenance Visit:  Health Maintenance Summary Reviewed and updated, unless pt declines services.  Tobacco History Reviewed. Alcohol: No concerns, no excessive use Exercise Habits: 5 days a week STD concerns: no risk or activity to increase risk Drug Use: None Encouraged self-testicular check  Health Maintenance  Topic Date Due  . Influenza Vaccine  09/19/2012  . Tetanus/tdap  07/10/2017  . Pneumococcal Polysaccharide Vaccine Age 34 And Over  Completed  . Zostavax  Completed    Labs reviewed with the patient.  Results for orders placed in visit on 02/19/12  LIPID PANEL      Component Value Range   Cholesterol 110  0 - 200 mg/dL   Triglycerides 47.4  0.0 - 149.0 mg/dL   HDL 25.95  >63.87 mg/dL   VLDL 56.4  0.0 - 33.2 mg/dL   LDL Cholesterol 50  0 - 99 mg/dL   Total CHOL/HDL Ratio 3    CBC WITH DIFFERENTIAL      Component Value Range   WBC 8.4  4.5 - 10.5 K/uL   RBC 4.23  4.22 - 5.81 Mil/uL   Hemoglobin 14.5  13.0 - 17.0 g/dL   HCT 95.1  88.4 - 16.6 %   MCV 100.2 (*) 78.0 - 100.0 fl   MCHC 34.2  30.0 - 36.0 g/dL   RDW 06.3  01.6 - 01.0 %   Platelets 220.0  150.0 - 400.0 K/uL   Neutrophils Relative 68.1  43.0 - 77.0 %   Lymphocytes Relative 18.5  12.0 - 46.0 %   Monocytes Relative 10.1  3.0 - 12.0 %   Eosinophils  Relative 3.1  0.0 - 5.0 %   Basophils Relative 0.2  0.0 - 3.0 %   Neutro Abs 5.7  1.4 - 7.7 K/uL   Lymphs Abs 1.5  0.7 - 4.0 K/uL   Monocytes Absolute 0.8  0.1 - 1.0 K/uL   Eosinophils Absolute 0.3  0.0 - 0.7 K/uL   Basophils Absolute 0.0  0.0 - 0.1 K/uL  HEPATIC FUNCTION PANEL      Component Value Range   Total Bilirubin 0.9  0.3 - 1.2 mg/dL   Bilirubin, Direct 0.2  0.0 - 0.3 mg/dL   Alkaline Phosphatase 78  39 - 117 U/L   AST 27  0 - 37 U/L   ALT 28  0 - 53 U/L   Total Protein 7.5  6.0 - 8.3 g/dL   Albumin 4.2  3.5 - 5.2 g/dL  BASIC METABOLIC PANEL      Component Value Range   Sodium 134 (*) 135 - 145 mEq/L   Potassium 4.9  3.5 - 5.1 mEq/L   Chloride 100  96 - 112 mEq/L   CO2 27  19 - 32 mEq/L   Glucose, Bld 118 (*) 70 -  99 mg/dL   BUN 22  6 - 23 mg/dL   Creatinine, Ser 0.8  0.4 - 1.5 mg/dL   Calcium 9.3  8.4 - 84.1 mg/dL   GFR 32.44  >01.02 mL/min  PSA      Component Value Range   PSA 0.01 (*) 0.10 - 4.00 ng/mL   1. Routine general medical examination at a health care facility    2. CAD, NATIVE VESSEL : s/p MI, on ASA 325 mg, ace, b blocker, crestor. Active, denies CP   3. PERIPHERAL VASCULAR DISEASE    4. HYPERTENSION, ESSENTIAL NOS : at goal on current meds   5. HYPERLIPIDEMIA-MIXED : at goal on crestor   6. Former cigarette smoker    7. LUNG CANCER, HX OF : asx, all f/u CXR has been negative DG Chest 2 View  8. PROSTATE CANCER, HX OF : PSA is 0.01   9. COLONIC POLYPS, ADENOMATOUS, HX OF    10. ABDOMINAL AORTIC ANEURYSM : last [place] 01/2011, 2 year f/u needed Abdominal Aortic Aneurysm duplex      Patient Active Problem List  Diagnosis  . HEPATITIS C  . HYPERLIPIDEMIA-MIXED  . Former cigarette smoker  . CAD, NATIVE VESSEL  . ABDOMINAL AORTIC ANEURYSM  . PERIPHERAL VASCULAR DISEASE  . ALLERGIC RHINITIS  . ABDOMINAL BRUIT  . LUNG CANCER, HX OF  . PROSTATE CANCER, HX OF  . HYPERTENSION, ESSENTIAL NOS  . COAGULOPATHY  . HEMORRHOIDS  . COLONIC POLYPS,  ADENOMATOUS, HX OF    Past Medical History  Diagnosis Date  . Cancer 2000    lung  . Prostate cancer   . Myocardial infarction 11/2008  . Allergy   . Arthritis   . Cataract   . Hyperlipidemia   . Raynauds disease   . Tinnitus of both ears   . CAD (coronary artery disease)   . AAA (abdominal aortic aneurysm)     Past Surgical History  Procedure Date  . Angioplasty / stenting femoral   . Lung cancer surgery     top rt lung removal  . Prostate surgery     for cancer,rad.with seed plantation  . Polypectomy   . Colonoscopy 2007    History  Substance Use Topics  . Smoking status: Former Smoker    Quit date: 01/20/2007  . Smokeless tobacco: Not on file  . Alcohol Use: 6.0 oz/week    10 Cans of beer per week    Family History  Problem Relation Age of Onset  . Colon cancer Sister 19    rectal cancer    Allergies  Allergen Reactions  . Amoxicillin Rash    Medication list has been reviewed and updated.  Outpatient Prescriptions Prior to Visit  Medication Sig Dispense Refill  . aspirin 325 MG buffered tablet Take 325 mg by mouth daily.        . CRESTOR 40 MG tablet TAKE 1 TABLET EVERY DAY  90 tablet  3  . lisinopril (PRINIVIL,ZESTRIL) 2.5 MG tablet TAKE 1 TABLET EVERY DAY  90 tablet  3  . Lutein 10 MG TABS Take 10 mg by mouth 2 (two) times daily.        . metoprolol tartrate (LOPRESSOR) 25 MG tablet TAKE 1 TABLET BY MOUTH TWICE A DAY  180 tablet  3  . albuterol (PROVENTIL HFA;VENTOLIN HFA) 108 (90 BASE) MCG/ACT inhaler Inhale 2 puffs into the lungs every 6 (six) hours as needed for wheezing.  1 Inhaler  0  . Multiple Vitamin (MULTIVITAMIN)  capsule Take 1 capsule by mouth every other day.         Last reviewed on 02/24/2012  2:46 PM by Consuello Masse, CMA  Review of Systems:   General: Denies fever, chills, sweats. No significant weight loss. Eyes: Denies blurring,significant itching ENT: Denies earache, sore throat, and hoarseness. Cardiovascular: Denies  chest pains, palpitations, dyspnea on exertion Respiratory: Denies cough, dyspnea at rest,wheeezing Breast: no concerns about lumps GI: Denies nausea, vomiting, diarrhea, constipation, change in bowel habits, abdominal pain, melena, hematochezia GU: Denies penile discharge, ED, urinary flow / outflow problems. No STD concerns. Musculoskeletal: Denies back pain, joint pain Derm: Denies rash, itching Neuro: Denies  paresthesias, frequent falls, frequent headaches Psych: Denies depression, anxiety Endocrine: Denies cold intolerance, heat intolerance, polydipsia Heme: Denies enlarged lymph nodes Allergy: No hayfever   Physical Examination: BP 120/82  Pulse 72  Temp 97.6 F (36.4 C) (Oral)  Ht 6' (1.829 m)  Wt 150 lb 8 oz (68.266 kg)  BMI 20.41 kg/m2  Ideal Body Weight: Weight in (lb) to have BMI = 25: 183.9   GEN: well developed, well nourished, no acute distress Eyes: conjunctiva and lids normal, PERRLA, EOMI ENT: TM clear, nares clear, oral exam WNL Neck: supple, no lymphadenopathy, no thyromegaly, no JVD Pulm: clear to auscultation and percussion, respiratory effort normal CV: regular rate and rhythm, S1-S2, no murmur, rub or gallop, no bruits, peripheral pulses normal and symmetric, no cyanosis, clubbing, edema or varicosities GI: soft, non-tender; no hepatosplenomegaly, masses; active bowel sounds all quadrants GU: defer Lymph: no cervical, axillary or inguinal adenopathy MSK: gait normal, muscle tone and strength WNL, no joint swelling, effusions, discoloration, crepitus  SKIN: clear, good turgor, color WNL, no rashes, lesions, or ulcerations Neuro: normal mental status, normal strength, sensation, and motion Psych: alert; oriented to person, place and time, normally interactive and not anxious or depressed in appearance.   Assessment and Plan:  1. Routine general medical examination at a health care facility    2. CAD, NATIVE VESSEL stable   3. PERIPHERAL VASCULAR  DISEASE    4. HYPERTENSION, ESSENTIAL NOS stable   5. HYPERLIPIDEMIA-MIXED stable   6. Former cigarette smoker    7. LUNG CANCER, HX OF : f/u CXR DG Chest 2 View  8. PROSTATE CANCER, HX OF : PSA 0.01   9. COLONIC POLYPS, ADENOMATOUS, HX OF    10. ABDOMINAL AORTIC ANEURYSM : adequate f/u Abdominal Aortic Aneurysm duplex  11. COPD with emphysema     I have personally reviewed the Medicare Annual Wellness questionnaire and have noted 1. The patient's medical and social history 2. Their use of alcohol, tobacco or illicit drugs 3. Their current medications and supplements 4. The patient's functional ability including ADL's, fall risks, home safety risks and hearing or visual             impairment. 5. Diet and physical activities 6. Evidence for depression or mood disorders  The patients weight, height, BMI and visual acuity have been recorded in the chart I have made referrals, counseling and provided education to the patient based review of the above and I have provided the pt with a written personalized care plan for preventive services.  I have provided the patient with a copy of your personalized plan for preventive services. Instructed to take the time to review along with their updated medication list.   Dg Chest 2 View  02/24/2012  *RADIOLOGY REPORT*  Clinical Data: History of lung carcinoma, follow-up  CHEST -  2 VIEW  Comparison: Chest x-ray of 02/12/2010  Findings: The lungs are very hyperaerated consistent with severe emphysema.  No focal infiltrate or effusion is seen.  No evidence of recurrence of lung carcinoma is noted.  The heart is mildly enlarged and stable.  No acute skeletal abnormality is seen.  IMPRESSION: Severe emphysema.  No evidence of recurrence.  No active lung disease.   Original Report Authenticated By: Dwyane Dee, M.D.     Orders Today:  Orders Placed This Encounter  Procedures  . DG Chest 2 View    Standing Status: Future     Number of Occurrences: 1      Standing Expiration Date: 04/25/2013    Order Specific Question:  Preferred imaging location?    Answer:  [facility]    Order Specific Question:  Reason for exam:    Answer:  h/o lung cancer    Updated Medication List: (Includes new medications, updates to list, dose adjustments) Meds ordered this encounter  Medications  . albuterol (PROVENTIL HFA;VENTOLIN HFA) 108 (90 BASE) MCG/ACT inhaler    Sig: Inhale 2 puffs into the lungs every 6 (six) hours as needed for wheezing.    Dispense:  1 Inhaler    Refill:  5    Medications Discontinued: Medications Discontinued During This Encounter  Medication Reason  . albuterol (PROVENTIL HFA;VENTOLIN HFA) 108 (90 BASE) MCG/ACT inhaler Reorder     Signed, Karleen Hampshire T. Gus Littler, MD 02/24/2012 2:59 PM

## 2012-02-24 NOTE — Patient Instructions (Addendum)
REFERRAL: GO THE THE FRONT ROOM AT THE ENTRANCE OF OUR CLINIC, NEAR CHECK IN. ASK FOR MARION. SHE WILL HELP YOU SET UP YOUR REFERRAL. DATE: TIME:  

## 2012-02-25 ENCOUNTER — Encounter: Payer: Medicare Other | Admitting: Family Medicine

## 2012-03-19 HISTORY — PX: CATARACT EXTRACTION W/PHACO: SHX586

## 2012-04-13 DIAGNOSIS — H2510 Age-related nuclear cataract, unspecified eye: Secondary | ICD-10-CM | POA: Diagnosis not present

## 2012-04-15 ENCOUNTER — Other Ambulatory Visit: Payer: Self-pay | Admitting: Cardiology

## 2012-05-09 ENCOUNTER — Other Ambulatory Visit: Payer: Self-pay | Admitting: Cardiology

## 2012-05-10 ENCOUNTER — Other Ambulatory Visit: Payer: Self-pay | Admitting: Cardiology

## 2012-07-11 ENCOUNTER — Other Ambulatory Visit: Payer: Self-pay | Admitting: Cardiology

## 2012-08-01 ENCOUNTER — Telehealth: Payer: Self-pay | Admitting: *Deleted

## 2012-08-01 NOTE — Telephone Encounter (Signed)
Needs nitro - expired 3 years ago.Marland KitchenMarland KitchenMarland Kitchen

## 2012-08-01 NOTE — Telephone Encounter (Signed)
Left message for pt to call, he has not been seen since 2012. Can make an appt then refill NTG.

## 2012-08-03 MED ORDER — NITROGLYCERIN 0.4 MG SL SUBL: 0.4000 mg | SUBLINGUAL_TABLET | SUBLINGUAL | Status: AC | PRN

## 2012-08-07 ENCOUNTER — Other Ambulatory Visit: Payer: Self-pay | Admitting: Cardiology

## 2012-08-22 ENCOUNTER — Encounter: Payer: Self-pay | Admitting: Physician Assistant

## 2012-08-22 ENCOUNTER — Ambulatory Visit (INDEPENDENT_AMBULATORY_CARE_PROVIDER_SITE_OTHER): Payer: Medicare Other | Admitting: Physician Assistant

## 2012-08-22 VITALS — BP 110/70 | HR 74 | Ht 71.0 in | Wt 144.0 lb

## 2012-08-22 DIAGNOSIS — I10 Essential (primary) hypertension: Secondary | ICD-10-CM

## 2012-08-22 DIAGNOSIS — I2510 Atherosclerotic heart disease of native coronary artery without angina pectoris: Secondary | ICD-10-CM | POA: Diagnosis not present

## 2012-08-22 DIAGNOSIS — I714 Abdominal aortic aneurysm, without rupture: Secondary | ICD-10-CM | POA: Diagnosis not present

## 2012-08-22 DIAGNOSIS — E785 Hyperlipidemia, unspecified: Secondary | ICD-10-CM

## 2012-08-22 NOTE — Patient Instructions (Addendum)
NO CHANGES WERE MADE TODAY  Your physician has requested that you have a lexiscan myoview. For further information please visit [email or web address] Please follow instruction sheet, as given.  Your physician has requested that you have an abdominal aorta duplex. TO BE SCHEDULED IN January 2015. During this test, an ultrasound is used to evaluate the aorta. Allow 30 minutes for this exam. Do not eat after midnight the day before and avoid carbonated beverages  Your physician recommends that you schedule a follow-up appointment in: IN 1 YEAR WITH DR.CRENSHAW

## 2012-08-22 NOTE — Progress Notes (Signed)
[address] [place], [place]  [address] Phone: [phone] Fax:  [phone]  Date:  08/22/2012   ID:  Patrick Sampson, DOB Oct 22, 1934, MRN [id number]  PCP:  Hannah Beat, MD  Cardiologist:  Dr. Olga Millers     History of Present Illness: Patrick Sampson is a 78 y.o. male who returns for follow up.    He has a hz of CAD, HTN, HL, AAA. He presented to the hospital in 11/2008 with an inferior STEMI. LHC 11/2008: Mid to distal LAD 70-75%, mid CFX occluded, ostial RCA 50%, mid RCA 50%, inferior AK, EF 50%. He underwent overlapping bare-metal stents to the CFX at that time. Follow up [facility] 01/2009 demonstrated mid LAD 50%, mid CFX stents patent. There was 50-70% stenosis at the proximal edge of the stent. IVUS of the CFX and LAD demonstrated no hemodynamically significant stenoses. Continued medical therapy was recommended. Abdominal US 01/2011:  2.4 x 2.4 cm. Follow up recommended in 2 years. Last seen by Dr. Jens Som 09/2010. Follow up planned in one year with routine Myoview to be arranged.  The patient denies chest pain, shortness of breath, syncope, orthopnea, PND or significant pedal edema.  He walks several times a week without difficulty.    Labs (1/13):  K 5.2, Cr 0.8 Labs (1/14):  K 4.9, Cr 0.8, ALT 28, TC 110, TG 95, HDL 41.5, LDL 50, Hgb 14.5  Wt Readings from Last 3 Encounters:  02/24/12 150 lb 8 oz (68.266 kg)  06/02/11 153 lb 8 oz (69.627 kg)  02/18/11 158 lb 1.9 oz (71.723 kg)     Past Medical History  Diagnosis Date  . Cancer 2000    lung  . Prostate cancer   . Myocardial infarction 11/2008  . Allergy   . Arthritis   . Cataract   . Hyperlipidemia   . Raynauds disease   . Tinnitus of both ears   . COPD with emphysema 02/24/2012  . CAD (coronary artery disease)     a. Inf STEMI 11/2008 => Mid to dist LAD 70-75%, mid CFX occluded, oRCA 50%, mRCA 50%, inf AK, EF 50% => PCI: overlapping BMSs to CFX;  b.  F/u LHC 01/2009:  mid LAD 50%, mid CFX stents  patent. There was 50-70% stenosis at the prox edge of the stent; IVUS of the CFX and LAD with no hemodynamically significant stenoses=>Med Rx.  Marland Kitchen AAA (abdominal aortic aneurysm)     a. Abdominal US 01/2011:  2.4 x 2.4 cm. Follow up recommended in 2 years.    Current Outpatient Prescriptions  Medication Sig Dispense Refill  . albuterol (PROVENTIL HFA;VENTOLIN HFA) 108 (90 BASE) MCG/ACT inhaler Inhale 2 puffs into the lungs every 6 (six) hours as needed for wheezing.  1 Inhaler  5  . aspirin 325 MG buffered tablet Take 325 mg by mouth daily.        . CRESTOR 40 MG tablet TAKE ONE TABLET BY MOUTH ONCE DAILY  90 tablet  0  . lisinopril (PRINIVIL,ZESTRIL) 2.5 MG tablet TAKE ONE TABLET BY MOUTH EVERY DAY  90 tablet  0  . Lutein 10 MG TABS Take 10 mg by mouth 2 (two) times daily.        . metoprolol tartrate (LOPRESSOR) 25 MG tablet TAKE ONE TABLET BY MOUTH TWICE DAILY  180 tablet  0  . Multiple Vitamin (MULTIVITAMIN) capsule Take 1 capsule by mouth every other day.        . nitroGLYCERIN (NITROSTAT) 0.4  MG SL tablet Place 1 tablet (0.4 mg total) under the tongue every 5 (five) minutes as needed for chest pain.  25 tablet  12   No current facility-administered medications for this visit.    Allergies:    Allergies  Allergen Reactions  . Amoxicillin Rash    Social History:  The patient  reports that he quit smoking about 5 years ago. He does not have any smokeless tobacco history on file. He reports that he drinks about 6.0 ounces of alcohol per week. He reports that he does not use illicit drugs.   ROS:  Please see the history of present illness.      All other systems reviewed and negative.   PHYSICAL EXAM: VS:  BP 110/70  Pulse 74  Ht 5\' 11"  (1.803 m)  Wt 144 lb (65.318 kg)  BMI 20.09 kg/m2 Well nourished, well developed, in no acute distress HEENT: normal Neck: no JVD Vascular: No carotid bruits Cardiac:  normal S1, S2; RRR; no murmur Lungs:  clear to auscultation bilaterally, no  wheezing, rhonchi or rales Abd: soft, nontender, no hepatomegaly Ext: no edema Skin: warm and dry Neuro:  CNs 2-12 intact, no focal abnormalities noted  EKG:  NSR, HR 74, rightward axis, RBBB, no change from prior     ASSESSMENT AND PLAN:  1. CAD:  No angina.  Continue ASA and statin.  Schedule ETT-Myoview as previously planned.  2. AAA:   F/u Abdominal US in 01/2013. 3. Hypertension:  Controlled.  Continue current therapy. 4. Hyperlipidemia:  LDL optimal by recent assessment.  Continue statin. 5. Disposition:  F/u with Dr. Olga Millers 1 year.   Signed, [name], PA-C  08/22/2012 10:03 AM

## 2012-08-29 ENCOUNTER — Encounter (HOSPITAL_COMMUNITY): Payer: Medicare Other | Attending: Cardiology | Admitting: Radiology

## 2012-08-29 VITALS — BP 129/79 | HR 73 | Ht 71.0 in | Wt 140.0 lb

## 2012-08-29 DIAGNOSIS — I10 Essential (primary) hypertension: Secondary | ICD-10-CM | POA: Diagnosis not present

## 2012-08-29 DIAGNOSIS — I2510 Atherosclerotic heart disease of native coronary artery without angina pectoris: Secondary | ICD-10-CM | POA: Insufficient documentation

## 2012-08-29 MED ORDER — TECHNETIUM TC 99M SESTAMIBI GENERIC - CARDIOLITE
33.0000 | Freq: Once | INTRAVENOUS | Status: AC | PRN
  Administered 2012-08-29: 33 via INTRAVENOUS

## 2012-08-29 MED ORDER — TECHNETIUM TC 99M SESTAMIBI GENERIC - CARDIOLITE
11.0000 | Freq: Once | INTRAVENOUS | Status: AC | PRN
  Administered 2012-08-29: 11 via INTRAVENOUS

## 2012-08-29 NOTE — Progress Notes (Signed)
[facility] SITE 3 NUCLEAR MED [address] [place], [place] [address] [phone]    Cardiology Nuclear Med Study  Patrick Sampson is a 78 y.o. male     MRN : [id number]     DOB: [birth year]  Procedure Date: 08/29/2012  Nuclear Med Background Indication for Stress Test:  Evaluation for Ischemia and Stent Patency History:  11/10 Inferior STEMI>Stent-CFX x 2; 1/11 Cath:patent stents with n/o CAD Cardiac Risk Factors: Family History - CAD, History of Smoking, Hypertension, Lipids, PVD (AAA 2.4 cm)  and RBBB  Symptoms:  No cardiac symptoms.   Nuclear Pre-Procedure Caffeine/Decaff Intake:  None > 12 hrs NPO After: 9:00pm   Lungs:  Clear. O2 Sat: 98% on room air. IV 0.9% NS with Angio Cath:  20g  IV Site: R Antecubital x 1, tolerated well IV Started by:  Irean Hong, RN  Chest Size (in):  40 Cup Size: n/a  Height: 5\' 11"  (1.803 m)  Weight:  140 lb (63.504 kg)  BMI:  Body mass index is 19.53 kg/(m^2). Tech Comments: Lopressor held x 24- hours    Nuclear Med Study 1 or 2 day study: 1 day  Stress Test Type:  Stress  Reading MD: Dietrich Pates, MD  Order Authorizing Provider:  Olga Millers, MD, and [name], PA-C  Resting Radionuclide: Technetium 52m Sestamibi  Resting Radionuclide Dose: 11.0 mCi   Stress Radionuclide:  Technetium 30m Sestamibi  Stress Radionuclide Dose: 33.0 mCi           Stress Protocol Rest HR: 73 Stress HR: 141  Rest BP: 129/79 Stress BP: 160/62  Exercise Time (min): 3:16 METS: 3:16   Predicted Max HR: 145 bpm % Max HR: 97.24 bpm Rate Pressure Product: 25366   Dose of Adenosine (mg):  n/a Dose of Lexiscan: n/a mg  Dose of Atropine (mg): n/a Dose of Dobutamine: n/a mcg/kg/min (at max HR)  Stress Test Technologist: Smiley Houseman, CMA-N  Nuclear Technologist:  Domenic Polite, CNMT     Rest Procedure:  Myocardial perfusion imaging was performed at rest 45 minutes following the intravenous administration of Technetium 66m Sestamibi.  Rest  ECG:SR 73 bpm.  RBBB.  Stress Procedure:  The patient exercised on the treadmill utilizing the Bruce Protocol for 3:16 minutes. The patient stopped due to dyspnea and denied any chest pain.  Technetium 42m Sestamibi was injected at peak exercise and myocardial perfusion imaging was performed after a brief delay.  Stress ECG: No significant change from baseline ECG  QPS Raw Data Images: Soft tissue (diaphragm) underlies heart. Stress Images: Inferolateral and lateral defect (base, mid, distal), inferior thinning (base, mid, distal)  Otherwise normal perfusion.   Rest Images:  Comparison with the stress images reveals no significant change. Subtraction (SDS):  No evidence of ischemia. Transient Ischemic Dilatation (Normal <1.22):  n/a Lung/Heart Ratio (Normal <0.45):  0.33  Quantitative Gated Spect Images QGS EDV:  n/a ml QGS ESV:  n/a ml  Impression Exercise Capacity:  Poor exercise capacity. BP Response:  Normal blood pressure response. Clinical Symptoms:  No chest pain. ECG Impression:  No significant ST segment change suggestive of ischemia. Comparison with Prior Nuclear Study: No previous nuclear study performed  Overall Impression:  Scar in the inferior, inferolateral wall; scar and or soft tissue attenuation inferiorly.  No ischemia   Overall low risk scan.    LV Ejection Fraction: Study not gated.  LV Wall Motion:  Images were not gated.  Dietrich Pates

## 2012-09-04 ENCOUNTER — Encounter: Payer: Self-pay | Admitting: Physician Assistant

## 2012-09-05 DIAGNOSIS — Z961 Presence of intraocular lens: Secondary | ICD-10-CM | POA: Diagnosis not present

## 2012-10-05 ENCOUNTER — Other Ambulatory Visit: Payer: Self-pay | Admitting: Cardiology

## 2012-10-14 DIAGNOSIS — Z23 Encounter for immunization: Secondary | ICD-10-CM | POA: Diagnosis not present

## 2012-11-07 ENCOUNTER — Other Ambulatory Visit: Payer: Self-pay | Admitting: Cardiology

## 2012-11-24 ENCOUNTER — Other Ambulatory Visit: Payer: Self-pay

## 2012-12-01 ENCOUNTER — Ambulatory Visit (INDEPENDENT_AMBULATORY_CARE_PROVIDER_SITE_OTHER): Payer: Medicare Other | Admitting: Family Medicine

## 2012-12-01 ENCOUNTER — Encounter: Payer: Self-pay | Admitting: Family Medicine

## 2012-12-01 VITALS — BP 104/60 | HR 72 | Temp 98.4°F | Ht 71.0 in | Wt 145.5 lb

## 2012-12-01 DIAGNOSIS — C61 Malignant neoplasm of prostate: Secondary | ICD-10-CM

## 2012-12-01 DIAGNOSIS — J301 Allergic rhinitis due to pollen: Secondary | ICD-10-CM | POA: Diagnosis not present

## 2012-12-01 DIAGNOSIS — H9313 Tinnitus, bilateral: Secondary | ICD-10-CM

## 2012-12-01 DIAGNOSIS — I7300 Raynaud's syndrome without gangrene: Secondary | ICD-10-CM

## 2012-12-01 DIAGNOSIS — J441 Chronic obstructive pulmonary disease with (acute) exacerbation: Secondary | ICD-10-CM | POA: Diagnosis not present

## 2012-12-01 DIAGNOSIS — F17201 Nicotine dependence, unspecified, in remission: Secondary | ICD-10-CM

## 2012-12-01 DIAGNOSIS — I213 ST elevation (STEMI) myocardial infarction of unspecified site: Secondary | ICD-10-CM

## 2012-12-01 DIAGNOSIS — B182 Chronic viral hepatitis C: Secondary | ICD-10-CM

## 2012-12-01 DIAGNOSIS — Z87891 Personal history of nicotine dependence: Secondary | ICD-10-CM

## 2012-12-01 DIAGNOSIS — J189 Pneumonia, unspecified organism: Secondary | ICD-10-CM

## 2012-12-01 DIAGNOSIS — H9193 Unspecified hearing loss, bilateral: Secondary | ICD-10-CM

## 2012-12-01 DIAGNOSIS — J438 Other emphysema: Secondary | ICD-10-CM

## 2012-12-01 DIAGNOSIS — I714 Abdominal aortic aneurysm, without rupture, unspecified: Secondary | ICD-10-CM

## 2012-12-01 DIAGNOSIS — I700 Atherosclerosis of aorta: Secondary | ICD-10-CM

## 2012-12-01 DIAGNOSIS — D689 Coagulation defect, unspecified: Secondary | ICD-10-CM

## 2012-12-01 DIAGNOSIS — E785 Hyperlipidemia, unspecified: Secondary | ICD-10-CM

## 2012-12-01 DIAGNOSIS — Z860100 Personal history of colon polyps, unspecified: Secondary | ICD-10-CM

## 2012-12-01 DIAGNOSIS — C3410 Malignant neoplasm of upper lobe, unspecified bronchus or lung: Secondary | ICD-10-CM

## 2012-12-01 DIAGNOSIS — H9190 Unspecified hearing loss, unspecified ear: Secondary | ICD-10-CM

## 2012-12-01 DIAGNOSIS — I10 Essential (primary) hypertension: Secondary | ICD-10-CM

## 2012-12-01 DIAGNOSIS — J439 Emphysema, unspecified: Secondary | ICD-10-CM

## 2012-12-01 DIAGNOSIS — H9319 Tinnitus, unspecified ear: Secondary | ICD-10-CM

## 2012-12-01 DIAGNOSIS — I2510 Atherosclerotic heart disease of native coronary artery without angina pectoris: Secondary | ICD-10-CM

## 2012-12-01 DIAGNOSIS — I739 Peripheral vascular disease, unspecified: Secondary | ICD-10-CM

## 2012-12-01 DIAGNOSIS — I219 Acute myocardial infarction, unspecified: Secondary | ICD-10-CM

## 2012-12-01 MED ORDER — AZITHROMYCIN 250 MG PO TABS: ORAL_TABLET | ORAL | Status: DC

## 2012-12-01 MED ORDER — PREDNISONE 20 MG PO TABS: ORAL_TABLET | ORAL | Status: DC

## 2012-12-01 MED ORDER — GUAIFENESIN-CODEINE 100-10 MG/5ML PO SOLN: 5.0000 mL | Freq: Three times a day (TID) | ORAL | Status: DC | PRN

## 2012-12-01 NOTE — Patient Instructions (Signed)
BRONCHITIS -Viral or baterial infections of the lung. Fever, cough, chest pain, shortness of breath, phlegm production, fatigue are symptoms.  -- I also think your COPD is flared up  Treatment: 1. Take all medicines 2. Antibiotics  3. Cough suppressants 4. Bronchodilators: an inhaler 5. Expectorant like Guaifenesin (Robitussin, Mucinex)  Fluids and Moisture help: drink lots of fluids Vaporizier or humidifier in room, shower steam --help loosen secretions and sooth breathing passages

## 2012-12-01 NOTE — Progress Notes (Signed)
Pre-visit discussion using our clinic review tool. No additional management support is needed unless otherwise documented below in the visit note.  

## 2012-12-01 NOTE — Progress Notes (Signed)
Date:  12/01/2012   Name:  Patrick Sampson   DOB:  Mar 16, 1934   MRN:  [id number] Gender: male Age: 78 y.o.  Primary Physician:  Hannah Beat, MD   Chief Complaint: Cough   Subjective:   History of Present Illness:  Patrick Sampson is a 78 y.o. pleasant patient who presents with the following:  Very pleasant gentleman who I remember well who has a history of a RIGHT upper lobectomy as well as prosthetic seed placement for lung cancer and prostate cancer. He has done well with this and has been disease-free for more than a decade. Normally, he is quite active, and is able to walk several miles a day and do anything he wants to. Today he has a pulse oximetry of 93 percent, feels poorly, short of breath, is wheezing somewhat, and has a productive cough with sputum. History is also significant for chronic obstructive pulmonary disease and a long-standing smoking history, than his tobacco 3 currently, and he also has a significant history for an inferior myocardial infarction several years ago.  5 days now feeling bad, weak, normally walks 2 1/2 miles a day, now none.  93% o2.   Patient Active Problem List   Diagnosis Date Noted  . Aortic calcification 12/02/2012  . Hearing loss 12/02/2012  . STEMI, s/p PCI 11/2008 12/02/2012  . Allergic rhinitis due to pollen   . Raynauds disease   . Tinnitus of both ears   . COPD with emphysema 02/24/2012  . COAGULOPATHY 03/20/2010  . HEMORRHOIDS 03/19/2010  . COLONIC POLYPS, ADENOMATOUS, HX OF 03/19/2010  . HYPERTENSION, ESSENTIAL NOS 02/12/2010  . ABDOMINAL BRUIT 01/25/2009  . HYPERLIPIDEMIA-MIXED 12/19/2008  . Tobacco abuse, in remission 12/19/2008  . CAD, NATIVE VESSEL 12/19/2008  . ABDOMINAL AORTIC ANEURYSM 07/11/2007  . Chronic hepatitis C 08/16/2006  . PERIPHERAL VASCULAR DISEASE 08/16/2006  . Cancer of lung, upper lobe, Right, in remission 08/16/2006  . Prostate cancer, in Remission, 12/1998 Seeds 08/16/2006    Past Medical  History  Diagnosis Date  . Prostate cancer 2000  . Myocardial infarction 11/2008  . Allergic rhinitis due to pollen   . Arthritis   . Cataract   . Hyperlipidemia   . Raynauds disease   . Tinnitus of both ears   . COPD with emphysema   . CAD (coronary artery disease) 11/2008    a. Inf STEMI 11/2008 => Mid to dist LAD 70-75%, mid CFX occluded, oRCA 50%, mRCA 50%, inf AK, EF 50% => PCI: overlapping BMSs to CFX;  b.  F/u LHC 01/2009:  mid LAD 50%, mid CFX stents patent. There was 50-70% stenosis at the prox edge of the stent; IVUS of the CFX and LAD with no hemodynamically significant stenoses=>Med Rx.;  c.  ETT-Myoview 8/14:  Inf and IL scar, no ischemia, low risk  . AAA (abdominal aortic aneurysm)     a. Abdominal US 01/2011:  2.4 x 2.4 cm. Follow up recommended in 2 years.  . Cancer of lung, upper lobe, Right, in remission 2000    Past Surgical History  Procedure Laterality Date  . Angioplasty / stenting femoral    . Lung lobectomy Right 2000    Right upper lobectomy  . Transperineal implant of radiation seeds w/ ultrasound  2000    for cancer,rad.with seed plantation  . Polypectomy    . Colonoscopy  2007    History   Social History  . Marital Status: Married    Spouse Name: N/A  Number of Children: N/A  . Years of Education: N/A   Occupational History  . Not on file.   Social History Main Topics  . Smoking status: Former Smoker -- 50 years    Types: Cigarettes    Quit date: 01/20/2007  . Smokeless tobacco: Never Used  . Alcohol Use: 6.0 oz/week    10 Cans of beer per week  . Drug Use: No  . Sexual Activity: Not on file   Other Topics Concern  . Not on file   Social History Narrative  . No narrative on file    Family History  Problem Relation Age of Onset  . Colon cancer Sister 16    rectal cancer  . Lung cancer Father 9    smoker and coal mining  . Congestive Heart Failure Mother   . CAD Brother   . Tuberculosis Brother   . Kidney failure Brother      ESRD on dialysis  . Coronary artery disease Brother     s/p CABG    Allergies  Allergen Reactions  . Amoxicillin Rash    Medication list has been reviewed and updated.  Review of Systems:  ROS: GEN: Acute illness details above GI: Tolerating PO intake GU: maintaining adequate hydration and urination Pulm: some SOB Interactive and getting along well at home.  Otherwise, ROS is as per the HPI.   Objective:   Physical Examination: BP 104/60  Pulse 72  Temp(Src) 98.4 F (36.9 C) (Oral)  Ht 5\' 11"  (1.803 m)  Wt 145 lb 8 oz (65.998 kg)  BMI 20.30 kg/m2  SpO2 93%  Ideal Body Weight: Weight in (lb) to have BMI = 25: 178.9   GEN: A and O x 3. WDWN. NAD.    ENT: Nose clear, ext NML.  No LAD.  No JVD.  TM's clear. Oropharynx clear.  PULM: Normal WOB, no distress. rhonchorous sounds throughout lower lobes with wheezing and decreased BS RUL. CV: RRR, no M/G/R, No rubs, No JVD.   EXT: warm and well-perfused, No c/c/e. PSYCH: Pleasant and conversant.   Laboratory and Imaging Data:  Assessment & Plan:    COPD exacerbation  Aortic calcification  Allergic rhinitis due to pollen  Raynauds disease  Tinnitus of both ears  Hearing loss, bilateral  CAD, NATIVE VESSEL  ABDOMINAL AORTIC ANEURYSM  PERIPHERAL VASCULAR DISEASE  Cancer of lung, upper lobe, right  Prostate cancer, in Remission, 12/1998 Seeds  HYPERTENSION, ESSENTIAL NOS  COPD with emphysema  STEMI, s/p PCI 11/2008  Chronic hepatitis C  HYPERLIPIDEMIA-MIXED  Tobacco abuse, in remission  COAGULOPATHY  COLONIC POLYPS, ADENOMATOUS, HX OF  CAP (community acquired pneumonia)  Primarily I think that he has a community-acquired pneumonia and a chronic obstructive pulmonary disease exacerbation. I've given him some antibiotics and prednisone the tight. He can use his bronchodilators more as well.  Also spent greater than 30 minutes in review of the patient's chart over encounter in updating a  great deal of data about his various disease processes, reviewing imaging studies, hospital discharge, and further revising and clarifying all aspects of this problem list.  Patient Instructions  BRONCHITIS -Viral or baterial infections of the lung. Fever, cough, chest pain, shortness of breath, phlegm production, fatigue are symptoms.  -- I also think your COPD is flared up  Treatment: 1. Take all medicines 2. Antibiotics  3. Cough suppressants 4. Bronchodilators: an inhaler 5. Expectorant like Guaifenesin (Robitussin, Mucinex)  Fluids and Moisture help: drink lots of fluids Vaporizier or humidifier  in room, shower steam --help loosen secretions and sooth breathing passages    Orders Today:  No orders of the defined types were placed in this encounter.    New medications, updates to list, dose adjustments: Meds ordered this encounter  Medications  . azithromycin (ZITHROMAX Z-PAK) 250 MG tablet    Sig: Take 2 tablets (500 mg) on  Day 1,  followed by 1 tablet (250 mg) once daily on Days 2 through 5.    Dispense:  6 each    Refill:  0  . guaiFENesin-codeine 100-10 MG/5ML syrup    Sig: Take 5 mLs by mouth 3 (three) times daily as needed for cough.    Dispense:  240 mL    Refill:  0  . predniSONE (DELTASONE) 20 MG tablet    Sig: 2 tabs po daily for 4 days, then 1 po for 3 days    Dispense:  11 tablet    Refill:  0    Signed,  Kash Mothershead T. Luda Charbonneau, MD, CAQ Sports Medicine  [facility] [address] [place] [address] Phone: [phone] Fax: [phone]  Updated Complete Medication List:   Medication List       This list is accurate as of: 12/01/12 11:59 PM.  Always use your most recent med list.               albuterol 108 (90 BASE) MCG/ACT inhaler  Commonly known as:  PROVENTIL HFA;VENTOLIN HFA  Inhale 2 puffs into the lungs every 6 (six) hours as needed for wheezing.     aspirin 325 MG buffered tablet  Take 325 mg by mouth  daily.     azithromycin 250 MG tablet  Commonly known as:  ZITHROMAX Z-PAK  Take 2 tablets (500 mg) on  Day 1,  followed by 1 tablet (250 mg) once daily on Days 2 through 5.     CRESTOR 40 MG tablet  Generic drug:  rosuvastatin  TAKE ONE TABLET BY MOUTH ONCE DAILY     guaiFENesin-codeine 100-10 MG/5ML syrup  Take 5 mLs by mouth 3 (three) times daily as needed for cough.     lisinopril 2.5 MG tablet  Commonly known as:  PRINIVIL,ZESTRIL  TAKE ONE TABLET BY MOUTH ONCE DAILY     Lutein 10 MG Tabs  Take 10 mg by mouth 2 (two) times daily.     metoprolol tartrate 25 MG tablet  Commonly known as:  LOPRESSOR  TAKE ONE TABLET BY MOUTH TWICE DAILY     nitroGLYCERIN 0.4 MG SL tablet  Commonly known as:  NITROSTAT  Place 1 tablet (0.4 mg total) under the tongue every 5 (five) minutes as needed for chest pain.     predniSONE 20 MG tablet  Commonly known as:  DELTASONE  2 tabs po daily for 4 days, then 1 po for 3 days

## 2012-12-02 ENCOUNTER — Encounter: Payer: Self-pay | Admitting: Family Medicine

## 2012-12-02 DIAGNOSIS — J301 Allergic rhinitis due to pollen: Secondary | ICD-10-CM | POA: Insufficient documentation

## 2012-12-02 DIAGNOSIS — I7300 Raynaud's syndrome without gangrene: Secondary | ICD-10-CM | POA: Insufficient documentation

## 2012-12-02 DIAGNOSIS — H9190 Unspecified hearing loss, unspecified ear: Secondary | ICD-10-CM | POA: Insufficient documentation

## 2012-12-02 DIAGNOSIS — I700 Atherosclerosis of aorta: Secondary | ICD-10-CM | POA: Insufficient documentation

## 2012-12-02 DIAGNOSIS — H9313 Tinnitus, bilateral: Secondary | ICD-10-CM | POA: Insufficient documentation

## 2012-12-02 DIAGNOSIS — I213 ST elevation (STEMI) myocardial infarction of unspecified site: Secondary | ICD-10-CM | POA: Insufficient documentation

## 2013-01-09 ENCOUNTER — Other Ambulatory Visit: Payer: Self-pay | Admitting: Cardiology

## 2013-01-26 ENCOUNTER — Ambulatory Visit (HOSPITAL_COMMUNITY): Payer: Medicare Other | Attending: Cardiology

## 2013-01-26 DIAGNOSIS — B1920 Unspecified viral hepatitis C without hepatic coma: Secondary | ICD-10-CM | POA: Diagnosis not present

## 2013-01-26 DIAGNOSIS — F17200 Nicotine dependence, unspecified, uncomplicated: Secondary | ICD-10-CM | POA: Diagnosis not present

## 2013-01-26 DIAGNOSIS — I714 Abdominal aortic aneurysm, without rupture, unspecified: Secondary | ICD-10-CM | POA: Insufficient documentation

## 2013-01-26 DIAGNOSIS — I700 Atherosclerosis of aorta: Secondary | ICD-10-CM

## 2013-01-26 DIAGNOSIS — I739 Peripheral vascular disease, unspecified: Secondary | ICD-10-CM | POA: Insufficient documentation

## 2013-01-26 DIAGNOSIS — I2510 Atherosclerotic heart disease of native coronary artery without angina pectoris: Secondary | ICD-10-CM | POA: Diagnosis not present

## 2013-01-27 ENCOUNTER — Telehealth: Payer: Self-pay | Admitting: *Deleted

## 2013-01-27 ENCOUNTER — Encounter: Payer: Self-pay | Admitting: Physician Assistant

## 2013-01-27 NOTE — Telephone Encounter (Signed)
lmptcb for AAA [place] results. I will cb on monday 01/30/13

## 2013-01-30 ENCOUNTER — Telehealth: Payer: Self-pay | Admitting: Cardiology

## 2013-01-30 NOTE — Telephone Encounter (Signed)
pt notified about AAA [place] with verbal understanding

## 2013-01-30 NOTE — Telephone Encounter (Signed)
New message  Patient is returning your call from Friday regarding test results. Please call and advise.

## 2013-02-08 ENCOUNTER — Other Ambulatory Visit: Payer: Self-pay | Admitting: Cardiology

## 2013-02-17 ENCOUNTER — Other Ambulatory Visit: Payer: Self-pay | Admitting: Family Medicine

## 2013-02-17 DIAGNOSIS — Z79899 Other long term (current) drug therapy: Secondary | ICD-10-CM

## 2013-02-17 DIAGNOSIS — Z125 Encounter for screening for malignant neoplasm of prostate: Secondary | ICD-10-CM

## 2013-02-17 DIAGNOSIS — E785 Hyperlipidemia, unspecified: Secondary | ICD-10-CM

## 2013-02-22 ENCOUNTER — Other Ambulatory Visit (INDEPENDENT_AMBULATORY_CARE_PROVIDER_SITE_OTHER): Payer: Medicare Other

## 2013-02-22 DIAGNOSIS — Z79899 Other long term (current) drug therapy: Secondary | ICD-10-CM | POA: Diagnosis not present

## 2013-02-22 DIAGNOSIS — Z125 Encounter for screening for malignant neoplasm of prostate: Secondary | ICD-10-CM

## 2013-02-22 DIAGNOSIS — E785 Hyperlipidemia, unspecified: Secondary | ICD-10-CM | POA: Diagnosis not present

## 2013-02-22 LAB — HEPATIC FUNCTION PANEL
ALK PHOS: 76 U/L (ref 39–117)
ALT: 16 U/L (ref 0–53)
AST: 20 U/L (ref 0–37)
Albumin: 3.6 g/dL (ref 3.5–5.2)
BILIRUBIN DIRECT: 0.2 mg/dL (ref 0.0–0.3)
BILIRUBIN TOTAL: 0.9 mg/dL (ref 0.3–1.2)
TOTAL PROTEIN: 6.9 g/dL (ref 6.0–8.3)

## 2013-02-22 LAB — BASIC METABOLIC PANEL
BUN: 13 mg/dL (ref 6–23)
CO2: 27 mEq/L (ref 19–32)
CREATININE: 0.7 mg/dL (ref 0.4–1.5)
Calcium: 9.1 mg/dL (ref 8.4–10.5)
Chloride: 96 mEq/L (ref 96–112)
GFR: 109.26 mL/min (ref >60.00)
Glucose, Bld: 96 mg/dL (ref 70–99)
Potassium: 4.7 mEq/L (ref 3.5–5.1)
Sodium: 131 mEq/L — ABNORMAL LOW (ref 135–145)

## 2013-02-22 LAB — LIPID PANEL
Cholesterol: 102 mg/dL (ref 0–200)
HDL: 42.9 mg/dL (ref >39.00)
LDL CALC: 45 mg/dL (ref 0–99)
Total CHOL/HDL Ratio: 2
Triglycerides: 73 mg/dL (ref 0.0–149.0)
VLDL: 14.6 mg/dL (ref 0.0–40.0)

## 2013-02-22 LAB — CBC WITH DIFFERENTIAL/PLATELET
BASOS PCT: 0.7 % (ref 0.0–3.0)
Basophils Absolute: 0.1 10*3/uL (ref 0.0–0.1)
EOS ABS: 0.4 10*3/uL (ref 0.0–0.7)
EOS PCT: 3.5 % (ref 0.0–5.0)
HCT: 42.3 % (ref 39.0–52.0)
Hemoglobin: 14 g/dL (ref 13.0–17.0)
LYMPHS PCT: 11.8 % — AB (ref 12.0–46.0)
Lymphs Abs: 1.2 10*3/uL (ref 0.7–4.0)
MCHC: 33.2 g/dL (ref 30.0–36.0)
MCV: 101.5 fl — ABNORMAL HIGH (ref 78.0–100.0)
Monocytes Absolute: 0.9 10*3/uL (ref 0.1–1.0)
Monocytes Relative: 9.2 % (ref 3.0–12.0)
NEUTROS PCT: 74.8 % (ref 43.0–77.0)
Neutro Abs: 7.6 10*3/uL (ref 1.4–7.7)
PLATELETS: 334 10*3/uL (ref 150.0–400.0)
RBC: 4.17 Mil/uL — ABNORMAL LOW (ref 4.22–5.81)
RDW: 14 % (ref 11.5–14.6)
WBC: 10.1 10*3/uL (ref 4.5–10.5)

## 2013-02-22 LAB — PSA, MEDICARE: PSA: 0.02 ng/mL — AB (ref 0.10–4.00)

## 2013-03-01 ENCOUNTER — Ambulatory Visit (INDEPENDENT_AMBULATORY_CARE_PROVIDER_SITE_OTHER): Payer: Medicare Other | Admitting: Family Medicine

## 2013-03-01 ENCOUNTER — Encounter: Payer: Self-pay | Admitting: Family Medicine

## 2013-03-01 VITALS — BP 110/60 | HR 75 | Temp 98.2°F | Ht 71.25 in | Wt 144.2 lb

## 2013-03-01 DIAGNOSIS — I2510 Atherosclerotic heart disease of native coronary artery without angina pectoris: Secondary | ICD-10-CM

## 2013-03-01 DIAGNOSIS — E785 Hyperlipidemia, unspecified: Secondary | ICD-10-CM | POA: Diagnosis not present

## 2013-03-01 DIAGNOSIS — I714 Abdominal aortic aneurysm, without rupture, unspecified: Secondary | ICD-10-CM

## 2013-03-01 DIAGNOSIS — Z23 Encounter for immunization: Secondary | ICD-10-CM | POA: Diagnosis not present

## 2013-03-01 DIAGNOSIS — B182 Chronic viral hepatitis C: Secondary | ICD-10-CM | POA: Diagnosis not present

## 2013-03-01 DIAGNOSIS — I10 Essential (primary) hypertension: Secondary | ICD-10-CM

## 2013-03-01 DIAGNOSIS — I700 Atherosclerosis of aorta: Secondary | ICD-10-CM

## 2013-03-01 DIAGNOSIS — C3410 Malignant neoplasm of upper lobe, unspecified bronchus or lung: Secondary | ICD-10-CM

## 2013-03-01 DIAGNOSIS — I213 ST elevation (STEMI) myocardial infarction of unspecified site: Secondary | ICD-10-CM

## 2013-03-01 DIAGNOSIS — J301 Allergic rhinitis due to pollen: Secondary | ICD-10-CM

## 2013-03-01 DIAGNOSIS — Z0000 Encounter for general adult medical examination without abnormal findings: Secondary | ICD-10-CM | POA: Diagnosis not present

## 2013-03-01 DIAGNOSIS — C61 Malignant neoplasm of prostate: Secondary | ICD-10-CM

## 2013-03-01 DIAGNOSIS — H9190 Unspecified hearing loss, unspecified ear: Secondary | ICD-10-CM

## 2013-03-01 DIAGNOSIS — I7300 Raynaud's syndrome without gangrene: Secondary | ICD-10-CM

## 2013-03-01 DIAGNOSIS — I739 Peripheral vascular disease, unspecified: Secondary | ICD-10-CM

## 2013-03-01 DIAGNOSIS — J439 Emphysema, unspecified: Secondary | ICD-10-CM

## 2013-03-01 DIAGNOSIS — H9313 Tinnitus, bilateral: Secondary | ICD-10-CM

## 2013-03-01 NOTE — Progress Notes (Signed)
Pre-visit discussion using our clinic review tool. No additional management support is needed unless otherwise documented below in the visit note.  

## 2013-03-01 NOTE — Progress Notes (Signed)
Date:  03/01/2013   Name:  Patrick Sampson   DOB:  Apr 12, 1934   MRN:  [id number] Gender: male Age: 79 y.o.  Primary Physician:  Owens Loffler, MD   Chief Complaint: Medicare Wellness   Subjective:   History of Present Illness:  Patrick Sampson is a 79 y.o. pleasant patient who presents with the following:  Preventative Health Maintenance Visit:  Health Maintenance Summary Reviewed and updated, unless pt declines services.  Tobacco History Reviewed. Alcohol: No concerns, no excessive use Exercise Habits: Very active. STD concerns: no risk or activity to increase risk Drug Use: None Encouraged self-testicular check  Health Maintenance  Topic Date Due  . Influenza Vaccine  08/19/2013  . Tetanus/tdap  07/10/2017  . Pneumococcal Polysaccharide Vaccine Age 2 And Over  Completed  . Zostavax  Completed    Immunization History  Administered Date(s) Administered  . Influenza Split 10/14/2012  . Influenza Whole 01/19/2005  . Pneumococcal Conjugate-13 03/01/2013  . Pneumococcal Polysaccharide-23 01/19/2002, 07/20/2008  . Td 01/19/1997, 07/11/2007  . Zoster 10/20/2006   Question of hepatitis C: "chronic hepatitis C" was listed in his old centricity chart, but the patient does not believe that he has this, and he does not think that he has ever been told this.   Lung cancer and prostate cancer, 15 year remission  Lipids and BP are stable CAD, s/p STEMI, on ASA, ace, and crestor, lopressor  Patient Active Problem List   Diagnosis Date Noted  . Aortic calcification 12/02/2012  . Hearing loss 12/02/2012  . STEMI, s/p PCI 11/2008 12/02/2012  . Allergic rhinitis due to pollen   . Raynauds disease   . Tinnitus of both ears   . COPD with emphysema 02/24/2012  . COAGULOPATHY 03/20/2010  . HEMORRHOIDS 03/19/2010  . COLONIC POLYPS, ADENOMATOUS, HX OF 03/19/2010  . HYPERTENSION, ESSENTIAL NOS 02/12/2010  . ABDOMINAL BRUIT 01/25/2009  . HYPERLIPIDEMIA-MIXED 12/19/2008  .  Tobacco abuse, in remission 12/19/2008  . CAD, NATIVE VESSEL 12/19/2008  . ABDOMINAL AORTIC ANEURYSM 07/11/2007  . PERIPHERAL VASCULAR DISEASE 08/16/2006  . Cancer of lung, upper lobe, Right, in remission 08/16/2006  . Prostate cancer, in Remission, 12/1998 Seeds 08/16/2006    Past Medical History  Diagnosis Date  . Prostate cancer 2000  . Myocardial infarction 11/2008  . Allergic rhinitis due to pollen   . Arthritis   . Cataract   . Hyperlipidemia   . Raynauds disease   . Tinnitus of both ears   . COPD with emphysema   . CAD (coronary artery disease) 11/2008    a. Inf STEMI 11/2008 => Mid to dist LAD 70-75%, mid CFX occluded, oRCA 50%, mRCA 50%, inf AK, EF 50% => PCI: overlapping BMSs to CFX;  b.  F/u LHC 01/2009:  mid LAD 50%, mid CFX stents patent. There was 50-70% stenosis at the prox edge of the stent; IVUS of the CFX and LAD with no hemodynamically significant stenoses=>Med Rx.;  c.  ETT-Myoview 8/14:  Inf and IL scar, no ischemia, low risk  . AAA (abdominal aortic aneurysm)     a. Abdominal US 01/2011:  2.4 x 2.4 cm. Follow up recommended in 2 years.;  b. Abdominal US (01/2013):  Infrarenal AAA 2.4 x 2.4 cm  . Cancer of lung, upper lobe, Right, in remission 2000    Past Surgical History  Procedure Laterality Date  . Angioplasty / stenting femoral    . Lung lobectomy Right 2000    Right upper lobectomy  . Transperineal  implant of radiation seeds w/ ultrasound  2000    for cancer,rad.with seed plantation  . Polypectomy    . Colonoscopy  2007    History   Social History  . Marital Status: Married    Spouse Name: N/A    Number of Children: N/A  . Years of Education: N/A   Occupational History  . Not on file.   Social History Main Topics  . Smoking status: Former Smoker -- 50 years    Types: Cigarettes    Quit date: 01/20/2007  . Smokeless tobacco: Never Used  . Alcohol Use: 6.0 oz/week    10 Cans of beer per week  . Drug Use: No  . Sexual Activity: Not on file     Other Topics Concern  . Not on file   Social History Narrative  . No narrative on file    Family History  Problem Relation Age of Onset  . Colon cancer Sister 10    rectal cancer  . Lung cancer Father 49    smoker and coal mining  . Congestive Heart Failure Mother   . CAD Brother   . Tuberculosis Brother   . Kidney failure Brother     ESRD on dialysis  . Coronary artery disease Brother     s/p CABG    Allergies  Allergen Reactions  . Amoxicillin Rash    Medication list has been reviewed and updated.  Review of Systems:  General: Denies fever, chills, sweats. No significant weight loss. Eyes: Denies blurring,significant itching ENT: Denies earache, sore throat, and hoarseness. Cardiovascular: Denies chest pains, palpitations, dyspnea on exertion Respiratory: Denies cough, dyspnea at rest,wheeezing Breast: no concerns about lumps GI: Denies nausea, vomiting, diarrhea, constipation, change in bowel habits, abdominal pain, melena, hematochezia GU: Denies penile discharge, ED, urinary flow / outflow problems. No STD concerns. Musculoskeletal: Denies back pain, joint pain Derm: Denies rash, itching Neuro: Denies  paresthesias, frequent falls, frequent headaches Psych: Denies depression, anxiety Endocrine: Denies cold intolerance, heat intolerance, polydipsia Heme: Denies enlarged lymph nodes Allergy: No hayfever  Objective:   Physical Examination: BP 110/60  Pulse 75  Temp(Src) 98.2 F (36.8 C) (Oral)  Ht 5' 11.25" (1.81 m)  Wt 144 lb 4 oz (65.431 kg)  BMI 19.97 kg/m2  SpO2 89% Ideal Body Weight: Weight in (lb) to have BMI = 25: 180.1  GEN: well developed, well nourished, no acute distress Eyes: conjunctiva and lids normal, PERRLA, EOMI ENT: TM clear, nares clear, oral exam WNL Neck: supple, no lymphadenopathy, no thyromegaly, no JVD Pulm: clear to auscultation and percussion, respiratory effort normal CV: regular rate and rhythm, S1-S2, no murmur, rub  or gallop, no bruits, peripheral pulses normal and symmetric, no cyanosis, clubbing, edema or varicosities GI: soft, non-tender; no hepatosplenomegaly, masses; active bowel sounds all quadrants GU: no hernia, testicular mass, penile discharge Lymph: no cervical, axillary or inguinal adenopathy MSK: gait normal, muscle tone and strength WNL, no joint swelling, effusions, discoloration, crepitus  SKIN: clear, good turgor, color WNL, no rashes, lesions, or ulcerations Neuro: normal mental status, normal strength, sensation, and motion Psych: alert; oriented to person, place and time, normally interactive and not anxious or depressed in appearance.  All labs reviewed with patient.  Lipids:    Component Value Date/Time   CHOL 102 02/22/2013 0849   TRIG 73.0 02/22/2013 0849   HDL 42.90 02/22/2013 0849   LDLDIRECT 50.8 08/14/2009 1057   VLDL 14.6 02/22/2013 0849   CHOLHDL 2 02/22/2013 0849  CBC:    Component Value Date/Time   WBC 10.1 02/22/2013 0849   HGB 14.0 02/22/2013 0849   HCT 42.3 02/22/2013 0849   PLT 334.0 02/22/2013 0849   MCV 101.5* 02/22/2013 0849   NEUTROABS 7.6 02/22/2013 0849   LYMPHSABS 1.2 02/22/2013 0849   MONOABS 0.9 02/22/2013 0849   EOSABS 0.4 02/22/2013 0849   BASOSABS 0.1 02/22/2013 7510    Basic Metabolic Panel:    Component Value Date/Time   NA 131* 02/22/2013 0849   K 4.7 02/22/2013 0849   CL 96 02/22/2013 0849   CO2 27 02/22/2013 0849   BUN 13 02/22/2013 0849   CREATININE 0.7 02/22/2013 0849   GLUCOSE 96 02/22/2013 0849   CALCIUM 9.1 02/22/2013 0849    Lab Results  Component Value Date   ALT 16 02/22/2013   AST 20 02/22/2013   ALKPHOS 76 02/22/2013   BILITOT 0.9 02/22/2013    Lab Results  Component Value Date   TSH 1.75 07/20/2008    Lab Results  Component Value Date   PSA 0.02* 02/22/2013   PSA 0.01* 02/19/2012   PSA 0.02* 02/13/2011    Assessment & Plan:   Health Maintenance Exam: The patient's preventative maintenance and recommended screening tests for an annual wellness exam  were reviewed in full today. Brought up to date unless services declined.  Counselled on the importance of diet, exercise, and its role in overall health and mortality. The patient's FH and SH was reviewed, including their home life, tobacco status, and drug and alcohol status.   Routine general medical examination at a health care facility: I have personally reviewed the Medicare Annual Wellness questionnaire and have noted 1. The patient's medical and social history 2. Their use of alcohol, tobacco or illicit drugs 3. Their current medications and supplements 4. The patient's functional ability including ADL's, fall risks, home safety risks and hearing or visual             impairment. 5. Diet and physical activities 6. Evidence for depression or mood disorders  The patients weight, height, BMI and visual acuity have been recorded in the chart I have made referrals, counseling and provided education to the patient based review of the above and I have provided the pt with a written personalized care plan for preventive services.  I have provided the patient with a copy of your personalized plan for preventive services. Instructed to take the time to review along with their updated medication list.   Chronic hepatitis C - Plan: Hepatitis C Antibody: I am not sure if he has this or not, and it very well could have been a documentation error.   Results for orders placed in visit on 03/01/13  HEPATITIS C ANTIBODY      Result Value Ref Range   HCV Ab NEGATIVE  NEGATIVE    He does not have Hep C  Need for prophylactic vaccination against Streptococcus pneumoniae (pneumococcus) - Plan: Pneumococcal conjugate vaccine 13-valent  CAD, NATIVE VESSEL  HYPERLIPIDEMIA-MIXED  ABDOMINAL AORTIC ANEURYSM: recent US is stable and reviewed  PERIPHERAL VASCULAR DISEASE  Cancer of lung, upper lobe, Right, in remission  Prostate cancer, in Remission, 12/1998 Seeds  HYPERTENSION, ESSENTIAL  NOS  COPD with emphysema  Aortic calcification  Allergic rhinitis due to pollen  Raynauds disease  Tinnitus of both ears  Hearing loss  STEMI, s/p PCI 11/2008  All other of above are stable.  There are no Patient Instructions on file for this visit.  Orders  Placed This Encounter  Procedures  . Pneumococcal conjugate vaccine 13-valent  . Hepatitis C Antibody    New medications, updates to list, dose adjustments: No orders of the defined types were placed in this encounter.    Signed,  Maud Deed. Cortlandt Capuano, MD, [place] [facility] [place] [address] Phone: [phone] Fax: [phone]    Medication List       This list is accurate as of: 03/01/13 11:59 PM.  Always use your most recent med list.               albuterol 108 (90 BASE) MCG/ACT inhaler  Commonly known as:  PROVENTIL HFA;VENTOLIN HFA  Inhale 2 puffs into the lungs every 6 (six) hours as needed for wheezing.     aspirin 325 MG buffered tablet  Take 325 mg by mouth daily.     CRESTOR 40 MG tablet  Generic drug:  rosuvastatin  TAKE ONE TABLET BY MOUTH ONCE DAILY     lisinopril 2.5 MG tablet  Commonly known as:  PRINIVIL,ZESTRIL  TAKE ONE TABLET BY MOUTH ONCE DAILY     Lutein 10 MG Tabs  Take 10 mg by mouth 2 (two) times daily.     metoprolol tartrate 25 MG tablet  Commonly known as:  LOPRESSOR  TAKE ONE TABLET BY MOUTH TWICE DAILY     nitroGLYCERIN 0.4 MG SL tablet  Commonly known as:  NITROSTAT  Place 1 tablet (0.4 mg total) under the tongue every 5 (five) minutes as needed for chest pain.

## 2013-03-02 LAB — HEPATITIS C ANTIBODY: HCV AB: NEGATIVE

## 2013-03-03 ENCOUNTER — Telehealth: Payer: Self-pay

## 2013-03-03 NOTE — Telephone Encounter (Signed)
Relevant patient education assigned to patient using Emmi. ° °

## 2013-04-17 ENCOUNTER — Encounter: Payer: Self-pay | Admitting: Internal Medicine

## 2013-04-17 ENCOUNTER — Ambulatory Visit (INDEPENDENT_AMBULATORY_CARE_PROVIDER_SITE_OTHER): Payer: Medicare Other | Admitting: Internal Medicine

## 2013-04-17 VITALS — BP 128/72 | HR 83 | Temp 97.5°F | Wt 137.0 lb

## 2013-04-17 DIAGNOSIS — J441 Chronic obstructive pulmonary disease with (acute) exacerbation: Secondary | ICD-10-CM

## 2013-04-17 DIAGNOSIS — I2510 Atherosclerotic heart disease of native coronary artery without angina pectoris: Secondary | ICD-10-CM | POA: Diagnosis not present

## 2013-04-17 MED ORDER — AZITHROMYCIN 250 MG PO TABS: ORAL_TABLET | ORAL | Status: DC

## 2013-04-17 MED ORDER — PREDNISONE 10 MG PO TABS: ORAL_TABLET | ORAL | Status: DC

## 2013-04-17 NOTE — Progress Notes (Signed)
Pre visit review using our clinic review tool, if applicable. No additional management support is needed unless otherwise documented below in the visit note. 

## 2013-04-17 NOTE — Patient Instructions (Addendum)
Chronic Obstructive Pulmonary Disease  Chronic obstructive pulmonary disease (COPD) is a common lung condition in which airflow from the lungs is limited. COPD is a general term that can be used to describe many different lung problems that limit airflow, including both chronic bronchitis and emphysema.  If you have COPD, your lung function will probably never return to normal, but there are measures you can take to improve lung function and make yourself feel better.   CAUSES   · Smoking (common).    · Exposure to secondhand smoke.    · Genetic problems.  · Chronic inflammatory lung diseases or recurrent infections.  SYMPTOMS   · Shortness of breath, especially with physical activity.    · Deep, persistent (chronic) cough with a large amount of thick mucus.    · Wheezing.    · Rapid breaths (tachypnea).    · Gray or bluish discoloration (cyanosis) of the skin, especially in fingers, toes, or lips.    · Fatigue.    · Weight loss.    · Frequent infections or episodes when breathing symptoms become much worse (exacerbations).    · Chest tightness.  DIAGNOSIS   Your healthcare provider will take a medical history and perform a physical examination to make the initial diagnosis.  Additional tests for COPD may include:   · Lung (pulmonary) function tests.  · Chest X-ray.  · CT scan.  · Blood tests.  TREATMENT   Treatment available to help you feel better when you have COPD include:   · Inhaler and nebulizer medicines. These help manage the symptoms of COPD and make your breathing more comfortable  · Supplemental oxygen. Supplemental oxygen is only helpful if you have a low oxygen level in your blood.    · Exercise and physical activity. These are beneficial for nearly all people with COPD. Some people may also benefit from a pulmonary rehabilitation program.  HOME CARE INSTRUCTIONS   · Take all medicines (inhaled or pills) as directed by your health care provider.  · Only take over-the-counter or prescription medicines  for pain, fever, or discomfort as directed by your health care provider.    · Avoid over-the-counter medicines or cough syrups that dry up your airway (such as antihistamines) and slow down the elimination of secretions unless instructed otherwise by your healthcare provider.    · If you are a smoker, the most important thing that you can do is stop smoking. Continuing to smoke will cause further lung damage and breathing trouble. Ask your health care provider for help with quitting smoking. He or she can direct you to community resources or hospitals that provide support.  · Avoid exposure to irritants such as smoke, chemicals, and fumes that aggravate your breathing.  · Use oxygen therapy and pulmonary rehabilitation if directed by your health care provider. If you require home oxygen therapy, ask your healthcare provider whether you should purchase a pulse oximeter to measure your oxygen level at home.    · Avoid contact with individuals who have a contagious illness.  · Avoid extreme temperature and humidity changes.  · Eat healthy foods. Eating smaller, more frequent meals and resting before meals may help you maintain your strength.  · Stay active, but balance activity with periods of rest. Exercise and physical activity will help you maintain your ability to do things you want to do.  · Preventing infection and hospitalization is very important when you have COPD. Make sure to receive all the vaccines your health care provider recommends, especially the pneumococcal and influenza vaccines. Ask your healthcare provider whether you   need a pneumonia vaccine.  · Learn and use relaxation techniques to manage stress.  · Learn and use controlled breathing techniques as directed by your health care provider. Controlled breathing techniques include:    · Pursed lip breathing. Start by breathing in (inhaling) through your nose for 1 second. Then, purse your lips as if you were going to whistle and breathe out (exhale)  through the pursed lips for 2 seconds.    · Diaphragmatic breathing. Start by putting one hand on your abdomen just above your waist. Inhale slowly through your nose. The hand on your abdomen should move out. Then purse your lips and exhale slowly. You should be able to feel the hand on your abdomen moving in as you exhale.    · Learn and use controlled coughing to clear mucus from your lungs. Controlled coughing is a series of short, progressive coughs. The steps of controlled coughing are:    1. Lean your head slightly forward.    2. Breathe in deeply using diaphragmatic breathing.    3. Try to hold your breath for 3 seconds.    4. Keep your mouth slightly open while coughing twice.    5. Spit any mucus out into a tissue.    6. Rest and repeat the steps once or twice as needed.  SEEK MEDICAL CARE IF:   · You are coughing up more mucus than usual.    · There is a change in the color or thickness of your mucus.    · Your breathing is more labored than usual.    · Your breathing is faster than usual.    SEEK IMMEDIATE MEDICAL CARE IF:   · You have shortness of breath while you are resting.    · You have shortness of breath that prevents you from:  · Being able to talk.    · Performing your usual physical activities.    · You have chest pain lasting longer than 5 minutes.    · Your skin color is more cyanotic than usual.  · You measure low oxygen saturations for longer than 5 minutes with a pulse oximeter.  MAKE SURE YOU:   · Understand these instructions.  · Will watch your condition.  · Will get help right away if you are not doing well or get worse.  Document Released: 10/15/2004 Document Revised: 10/26/2012 Document Reviewed: 09/01/2012  [employer]® Patient Information ©2014 [employer].

## 2013-04-17 NOTE — Progress Notes (Signed)
Subjective:    Patient ID: Patrick Sampson, male    DOB: 04-04-1934, 79 y.o.   MRN: [id number]  HPI  Pt presents to the clinic today with c/o cough, shortness of breath and dizziness. He reports this started 3 days ago. He feels more short of breath with exertion. He denies chest pain, chest tightness. The cough is productive of thick white sputum. He denies fevers, chills but has had body aches. He has tried OTC cough syrup and cough drops without much relief. He reports he was up all night coughing. He does have a history of COPD. He is not currently smoking.  Review of Systems      Past Medical History  Diagnosis Date  . Prostate cancer 2000  . Myocardial infarction 11/2008  . Allergic rhinitis due to pollen   . Arthritis   . Cataract   . Hyperlipidemia   . Raynauds disease   . Tinnitus of both ears   . COPD with emphysema   . CAD (coronary artery disease) 11/2008    a. Inf STEMI 11/2008 => Mid to dist LAD 70-75%, mid CFX occluded, oRCA 50%, mRCA 50%, inf AK, EF 50% => PCI: overlapping BMSs to CFX;  b.  F/u LHC 01/2009:  mid LAD 50%, mid CFX stents patent. There was 50-70% stenosis at the prox edge of the stent; IVUS of the CFX and LAD with no hemodynamically significant stenoses=>Med Rx.;  c.  ETT-Myoview 8/14:  Inf and IL scar, no ischemia, low risk  . AAA (abdominal aortic aneurysm)     a. Abdominal US 01/2011:  2.4 x 2.4 cm. Follow up recommended in 2 years.;  b. Abdominal US (01/2013):  Infrarenal AAA 2.4 x 2.4 cm  . Cancer of lung, upper lobe, Right, in remission 2000    Current Outpatient Prescriptions  Medication Sig Dispense Refill  . albuterol (PROVENTIL HFA;VENTOLIN HFA) 108 (90 BASE) MCG/ACT inhaler Inhale 2 puffs into the lungs every 6 (six) hours as needed for wheezing.  1 Inhaler  5  . aspirin 325 MG buffered tablet Take 325 mg by mouth daily.        . CRESTOR 40 MG tablet TAKE ONE TABLET BY MOUTH ONCE DAILY  90 tablet  0  . lisinopril (PRINIVIL,ZESTRIL) 2.5 MG  tablet TAKE ONE TABLET BY MOUTH ONCE DAILY  90 tablet  2  . Lutein 10 MG TABS Take 10 mg by mouth 2 (two) times daily.        . metoprolol tartrate (LOPRESSOR) 25 MG tablet TAKE ONE TABLET BY MOUTH TWICE DAILY  180 tablet  2  . nitroGLYCERIN (NITROSTAT) 0.4 MG SL tablet Place 1 tablet (0.4 mg total) under the tongue every 5 (five) minutes as needed for chest pain.  25 tablet  12  . azithromycin (ZITHROMAX) 250 MG tablet Take 2 tabs today, then 1 tab daily x 4 days  6 tablet  0  . predniSONE (DELTASONE) 10 MG tablet Take 3 tabs on days 1-2, take 2 tabs on days 3-4, take 1 tab on days 5-6  12 tablet  0   No current facility-administered medications for this visit.    Allergies  Allergen Reactions  . Amoxicillin Rash    Family History  Problem Relation Age of Onset  . Colon cancer Sister 24    rectal cancer  . Lung cancer Father 70    smoker and coal mining  . Congestive Heart Failure Mother   . CAD Brother   .  Tuberculosis Brother   . Kidney failure Brother     ESRD on dialysis  . Coronary artery disease Brother     s/p CABG    History   Social History  . Marital Status: Married    Spouse Name: N/A    Number of Children: N/A  . Years of Education: N/A   Occupational History  . Not on file.   Social History Main Topics  . Smoking status: Former Smoker -- 50 years    Types: Cigarettes    Quit date: 01/20/2007  . Smokeless tobacco: Never Used  . Alcohol Use: 6.0 oz/week    10 Cans of beer per week     Comment: moderate  . Drug Use: No  . Sexual Activity: Not on file   Other Topics Concern  . Not on file   Social History Narrative  . No narrative on file     Constitutional: Pt reports fatigue. Denies fever, malaise, headache or abrupt weight changes.  HEENT: Denies eye pain, eye redness, ear pain, ringing in the ears, wax buildup, runny nose, nasal congestion, bloody nose, or sore throat. Respiratory: Pt reports shortness of breath and cough. Denies difficulty  breathing.   Cardiovascular: Denies chest pain, chest tightness, palpitations or swelling in the hands or feet.    No other specific complaints in a complete review of systems (except as listed in HPI above).  Objective:   Physical Exam   BP 128/72  Pulse 83  Temp(Src) 97.5 F (36.4 C) (Oral)  Wt 137 lb (62.143 kg)  SpO2 96% Wt Readings from Last 3 Encounters:  04/17/13 137 lb (62.143 kg)  03/01/13 144 lb 4 oz (65.431 kg)  12/01/12 145 lb 8 oz (65.998 kg)    General: Appears his stated age, well developed, well nourished in NAD. Cardiovascular: Normal rate and rhythm. S1,S2 noted.  No murmur, rubs or gallops noted. No JVD or BLE edema. No carotid bruits noted. Pulmonary/Chest: Normal effort and very coarse lung sounds with bilateral expiratory and inspiratory wheezing noted. No respiratory distress.   BMET    Component Value Date/Time   NA 131* 02/22/2013 0849   K 4.7 02/22/2013 0849   CL 96 02/22/2013 0849   CO2 27 02/22/2013 0849   GLUCOSE 96 02/22/2013 0849   BUN 13 02/22/2013 0849   CREATININE 0.7 02/22/2013 0849   CALCIUM 9.1 02/22/2013 0849   GFRNONAA 110.32 08/14/2009 1057   GFRAA  Value: >60        The eGFR has been calculated using the MDRD equation. This calculation has not been validated in all clinical situations. eGFR's persistently <60 mL/min signify possible Chronic Kidney Disease. 12/02/2008 0536    Lipid Panel     Component Value Date/Time   CHOL 102 02/22/2013 0849   TRIG 73.0 02/22/2013 0849   HDL 42.90 02/22/2013 0849   CHOLHDL 2 02/22/2013 0849   VLDL 14.6 02/22/2013 0849   LDLCALC 45 02/22/2013 0849    CBC    Component Value Date/Time   WBC 10.1 02/22/2013 0849   RBC 4.17* 02/22/2013 0849   HGB 14.0 02/22/2013 0849   HCT 42.3 02/22/2013 0849   PLT 334.0 02/22/2013 0849   MCV 101.5* 02/22/2013 0849   MCHC 33.2 02/22/2013 0849   RDW 14.0 02/22/2013 0849   LYMPHSABS 1.2 02/22/2013 0849   MONOABS 0.9 02/22/2013 0849   EOSABS 0.4 02/22/2013 0849   BASOSABS 0.1 02/22/2013 0849    Hgb  A1C Lab Results  Component Value  Date   HGBA1C 6.5* 07/08/2006        Assessment & Plan:   COPD exacerbation:  He declines neb treatment in office today He does have albuterol at home, take every 4-6 hours as needed eRx for azithromax x 5 days eRx for pred taper x 6 days Watch for fever, or worsening shortness of breath, if so, go to ER  RTC as needed

## 2013-04-26 ENCOUNTER — Other Ambulatory Visit: Payer: Self-pay

## 2013-04-26 MED ORDER — ALBUTEROL SULFATE HFA 108 (90 BASE) MCG/ACT IN AERS: 2.0000 | INHALATION_SPRAY | Freq: Four times a day (QID) | RESPIRATORY_TRACT | Status: DC | PRN

## 2013-04-26 NOTE — Telephone Encounter (Signed)
Pt request refill ventolin HFA inhaler to have on hand incase needed to [facility]. Advised pt done.pt said doing OK just wants to have if needed.

## 2013-05-08 ENCOUNTER — Other Ambulatory Visit: Payer: Self-pay | Admitting: Cardiology

## 2013-05-25 ENCOUNTER — Ambulatory Visit (INDEPENDENT_AMBULATORY_CARE_PROVIDER_SITE_OTHER): Payer: Medicare Other | Admitting: Family Medicine

## 2013-05-25 ENCOUNTER — Encounter: Payer: Self-pay | Admitting: Family Medicine

## 2013-05-25 VITALS — BP 100/58 | HR 78 | Temp 97.5°F | Ht 71.25 in | Wt 139.5 lb

## 2013-05-25 DIAGNOSIS — J441 Chronic obstructive pulmonary disease with (acute) exacerbation: Secondary | ICD-10-CM

## 2013-05-25 DIAGNOSIS — J438 Other emphysema: Secondary | ICD-10-CM

## 2013-05-25 DIAGNOSIS — I2510 Atherosclerotic heart disease of native coronary artery without angina pectoris: Secondary | ICD-10-CM

## 2013-05-25 DIAGNOSIS — J439 Emphysema, unspecified: Secondary | ICD-10-CM

## 2013-05-25 MED ORDER — TIOTROPIUM BROMIDE MONOHYDRATE 18 MCG IN CAPS: 18.0000 ug | ORAL_CAPSULE | Freq: Every day | RESPIRATORY_TRACT | Status: DC

## 2013-05-25 MED ORDER — FLUTICASONE-SALMETEROL 250-50 MCG/DOSE IN AEPB: 1.0000 | INHALATION_SPRAY | Freq: Two times a day (BID) | RESPIRATORY_TRACT | Status: DC

## 2013-05-25 MED ORDER — PREDNISONE 20 MG PO TABS: ORAL_TABLET | ORAL | Status: DC

## 2013-05-25 NOTE — Progress Notes (Signed)
   Subjective:    Patient ID: Patrick Sampson, male    DOB: March 01, 1934, 79 y.o.   MRN: [id number]  HPI  79 year old male pt of Dr. Lillie Fragmin with history of COPD with emphysema and lung cancer s/p resection in remission presents for COPD follow up.   He was seen on 04/17/2013 for COPD exacerbation by [facility]. Improved with prednisone taper and azithromycin.  In last 1-2 weeks he has had worsening of shortness and wheeze, decreased endurance.  Cough ongoing daily, but fits at night, clear mucus no change in appearance. No fever. Nasal congestion. No sneeze. No ear pain, no face pain.  No night sweats. He has lost weight in last 2 years. Wt Readings from Last 3 Encounters:  05/25/13 139 lb 8 oz (63.277 kg)  04/17/13 137 lb (62.143 kg)  03/01/13 144 lb 4 oz (65.431 kg)    Saline irrigation. Uses albuterol 5-6 times a day in last 2 weeks.  He has never been on any controlling medication.  No recent spirometry.  Last CXR 02/2013.  Review of Systems  Constitutional: Negative for fever and fatigue.  HENT: Negative for ear pain.   Eyes: Negative for pain.  Respiratory: Negative for shortness of breath.   Cardiovascular: Negative for chest pain.       Objective:   Physical Exam  Constitutional: Vital signs are normal. He appears well-developed and well-nourished.  Non-toxic appearance. He does not appear ill. No distress.  HENT:  Head: Normocephalic and atraumatic.  Right Ear: Hearing, tympanic membrane, external ear and ear canal normal. No tenderness. No foreign bodies. Tympanic membrane is not retracted and not bulging.  Left Ear: Hearing, tympanic membrane, external ear and ear canal normal. No tenderness. No foreign bodies. Tympanic membrane is not retracted and not bulging.  Nose: Nose normal. No mucosal edema or rhinorrhea. Right sinus exhibits no maxillary sinus tenderness and no frontal sinus tenderness. Left sinus exhibits no maxillary sinus tenderness and no frontal sinus  tenderness.  Mouth/Throat: Uvula is midline, oropharynx is clear and moist and mucous membranes are normal. Normal dentition. No dental caries. No oropharyngeal exudate or tonsillar abscesses.  Eyes: Conjunctivae, EOM and lids are normal. Pupils are equal, round, and reactive to light. Lids are everted and swept, no foreign bodies found.  Neck: Trachea normal, normal range of motion and phonation normal. Neck supple. Carotid bruit is not present. No mass and no thyromegaly present.  Cardiovascular: Normal rate, regular rhythm, S1 normal, S2 normal, normal heart sounds, intact distal pulses and normal pulses.  Exam reveals no gallop.   No murmur heard. Pulmonary/Chest: Effort normal and breath sounds normal. No respiratory distress. He has no wheezes. He has no rhonchi. He has no rales.  Abdominal: Soft. Normal appearance and bowel sounds are normal. There is no hepatosplenomegaly. There is no tenderness. There is no rebound, no guarding and no CVA tenderness. No hernia.  Neurological: He is alert. He has normal reflexes.  Skin: Skin is warm, dry and intact. No rash noted.  Psychiatric: He has a normal mood and affect. His speech is normal and behavior is normal. Judgment normal.          Assessment & Plan:

## 2013-05-25 NOTE — Patient Instructions (Addendum)
Start prednisone taper... If fever or change in sputum, make appointment for re-eval. Start advair  twice daily and spiriva daily.  Follow up in 1 month for repeat spirometry. Limit albuterol use to every 4-6 hours only as needed.Marland Kitchen

## 2013-05-25 NOTE — Assessment & Plan Note (Signed)
No current sign of bacterial infection.  Treat with pred taper.

## 2013-05-25 NOTE — Assessment & Plan Note (Signed)
Not on controller. Start advair and spiriva daily.  Follow up in 1 month for repeat spirometry.

## 2013-05-25 NOTE — Progress Notes (Signed)
Pre visit review using our clinic review tool, if applicable. No additional management support is needed unless otherwise documented below in the visit note. 

## 2013-05-26 ENCOUNTER — Telehealth: Payer: Self-pay | Admitting: *Deleted

## 2013-05-26 NOTE — Telephone Encounter (Signed)
Received Prior Auth reqeust  from [facility] for Advair 250-50 mcg.  Prior Authorization completed on CoverMyMeds.

## 2013-05-26 NOTE — Telephone Encounter (Signed)
Mr. Maye notified PA has been completed and now we just have to wait to hear back from [employer] as to wether they approve it or not.

## 2013-05-26 NOTE — Telephone Encounter (Signed)
Pt left v/m requesting cb about status of Advair PA.

## 2013-05-29 NOTE — Telephone Encounter (Signed)
Advair Diskus 250-50 mg approved for non-formulary exception through 01/18/2014.

## 2013-05-29 NOTE — Telephone Encounter (Signed)
Patrick Sampson notified Advair has been approved.

## 2013-06-22 ENCOUNTER — Encounter: Payer: Self-pay | Admitting: Family Medicine

## 2013-06-22 ENCOUNTER — Ambulatory Visit (INDEPENDENT_AMBULATORY_CARE_PROVIDER_SITE_OTHER): Payer: Medicare Other | Admitting: Family Medicine

## 2013-06-22 VITALS — BP 100/50 | HR 79 | Temp 97.7°F | Ht 71.25 in | Wt 143.2 lb

## 2013-06-22 DIAGNOSIS — R011 Cardiac murmur, unspecified: Secondary | ICD-10-CM

## 2013-06-22 DIAGNOSIS — J441 Chronic obstructive pulmonary disease with (acute) exacerbation: Secondary | ICD-10-CM

## 2013-06-22 DIAGNOSIS — J438 Other emphysema: Secondary | ICD-10-CM | POA: Diagnosis not present

## 2013-06-22 DIAGNOSIS — I2510 Atherosclerotic heart disease of native coronary artery without angina pectoris: Secondary | ICD-10-CM

## 2013-06-22 DIAGNOSIS — J439 Emphysema, unspecified: Secondary | ICD-10-CM

## 2013-06-22 DIAGNOSIS — J449 Chronic obstructive pulmonary disease, unspecified: Secondary | ICD-10-CM | POA: Diagnosis not present

## 2013-06-22 NOTE — Patient Instructions (Signed)
Continue spiriva and advair at current dose. Follow up with PCP in 6 months for re-eval.  Follow up with cardiology as planned for routine issues and for ? New murmur.

## 2013-06-22 NOTE — Progress Notes (Signed)
Pre visit review using our clinic review tool, if applicable. No additional management support is needed unless otherwise documented below in the visit note. 

## 2013-06-22 NOTE — Assessment & Plan Note (Addendum)
?   New, not clearly documented previously, I did not hear at last OV given lung sounds. Has follow up with Dr. Stanford Breed next month. May need ECHO to eval further.

## 2013-06-22 NOTE — Assessment & Plan Note (Signed)
Improved significantly subjectively and with spirometry on Advair and spiriva. Follow up with PCP in 6 moth for re-eval to consider med increase. Up to date with prevnar and pneumovax.

## 2013-06-22 NOTE — Progress Notes (Signed)
79 year old male pt of Dr. Lillie Fragmin with history of COPD with emphysema and lung cancer s/p resection in remission presents for COPD follow up.   He was seen on 04/17/2013 for COPD exacerbation by [facility]. Improved with prednisone taper and azithromycin, but wheeze, SOB returned along with mucus.  At appt 1 month ago  He was started on advair and spiriva following spirometry that showed moderate airway obstruction. Last CXR 02/2013.   Interval history: He feels like breathing is better, > 80 %. No congestion He is back to walk ing and exercisng, not required albuterol any longer.  No SE.  Doing great!   Review of Systems  Constitutional: Negative for fever and fatigue.  HENT: Negative for ear pain.  Eyes: Negative for pain.  Respiratory: Negative for shortness of breath.  Cardiovascular: Negative for chest pain.  Objective:   Physical Exam  Constitutional: Vital signs are normal. He appears well-developed and well-nourished. Non-toxic appearance. He does not appear ill. No distress.  HENT:  Head: Normocephalic and atraumatic.  Right Ear: Hearing, tympanic membrane, external ear and ear canal normal. No tenderness. No foreign bodies. Tympanic membrane is not retracted and not bulging.  Left Ear: Hearing, tympanic membrane, external ear and ear canal normal. No tenderness. No foreign bodies. Tympanic membrane is not retracted and not bulging.  Nose: Nose normal. No mucosal edema or rhinorrhea. Right sinus exhibits no maxillary sinus tenderness and no frontal sinus tenderness. Left sinus exhibits no maxillary sinus tenderness and no frontal sinus tenderness.  Mouth/Throat: Uvula is midline, oropharynx is clear and moist and mucous membranes are normal. Normal dentition. No dental caries. No oropharyngeal exudate or tonsillar abscesses.  Eyes: Conjunctivae, EOM and lids are normal. Pupils are equal, round, and reactive to light. Lids are everted and swept, no foreign bodies found.  Neck:  Trachea normal, normal range of motion and phonation normal. Neck supple. Carotid bruit is not present. No mass and no thyromegaly present.  Cardiovascular: Normal rate, regular rhythm, S1 normal, S2 normal, systolic murmur > RUSB ( ? New has appt with Cards for routine follow up in 07/2013), intact distal pulses and normal pulses. Exam reveals no gallop.   Pulmonary/Chest: Effort normal and breath sounds normal. No respiratory distress. He has no wheezes. He has no rhonchi. He has no rales.  Abdominal: Soft. Normal appearance and bowel sounds are normal. There is no hepatosplenomegaly. There is no tenderness. There is no rebound, no guarding and no CVA tenderness. No hernia.  Neurological: He is alert. He has normal reflexes.  Skin: Skin is warm, dry and intact. No rash noted.  Psychiatric: He has a normal mood and affect. His speech is normal and behavior is normal. Judgment normal.

## 2013-06-22 NOTE — Assessment & Plan Note (Signed)
Resolved

## 2013-06-23 ENCOUNTER — Ambulatory Visit: Payer: Medicare Other | Admitting: Family Medicine

## 2013-08-07 ENCOUNTER — Other Ambulatory Visit: Payer: Self-pay | Admitting: Cardiology

## 2013-08-07 NOTE — Telephone Encounter (Signed)
Liliane Shi, PA-C at 08/22/2012 10:03 AM NO CHANGES WERE MADE TODAY CRESTOR 40 MG tablet  TAKE ONE TABLET BY MOUTH ONCE DAILY   90 tablet

## 2013-08-18 ENCOUNTER — Ambulatory Visit (INDEPENDENT_AMBULATORY_CARE_PROVIDER_SITE_OTHER): Payer: Medicare Other | Admitting: Family Medicine

## 2013-08-18 ENCOUNTER — Encounter: Payer: Self-pay | Admitting: Family Medicine

## 2013-08-18 ENCOUNTER — Ambulatory Visit (INDEPENDENT_AMBULATORY_CARE_PROVIDER_SITE_OTHER)
Admission: RE | Admit: 2013-08-18 | Discharge: 2013-08-18 | Disposition: A | Discharge status: Home / Self Care | Payer: Medicare Other | Source: Ambulatory Visit | Attending: Family Medicine | Admitting: Family Medicine

## 2013-08-18 VITALS — BP 112/72 | HR 60 | Temp 98.2°F | Ht 71.25 in | Wt 151.5 lb

## 2013-08-18 DIAGNOSIS — M899 Disorder of bone, unspecified: Secondary | ICD-10-CM

## 2013-08-18 DIAGNOSIS — M542 Cervicalgia: Secondary | ICD-10-CM

## 2013-08-18 DIAGNOSIS — R079 Chest pain, unspecified: Secondary | ICD-10-CM

## 2013-08-18 DIAGNOSIS — I2510 Atherosclerotic heart disease of native coronary artery without angina pectoris: Secondary | ICD-10-CM

## 2013-08-18 DIAGNOSIS — M949 Disorder of cartilage, unspecified: Secondary | ICD-10-CM

## 2013-08-18 DIAGNOSIS — M898X1 Other specified disorders of bone, shoulder: Secondary | ICD-10-CM

## 2013-08-18 DIAGNOSIS — M47812 Spondylosis without myelopathy or radiculopathy, cervical region: Secondary | ICD-10-CM | POA: Diagnosis not present

## 2013-08-18 LAB — BASIC METABOLIC PANEL
BUN: 21 mg/dL (ref 6–23)
CALCIUM: 9.2 mg/dL (ref 8.4–10.5)
CO2: 23 mEq/L (ref 19–32)
Chloride: 97 mEq/L (ref 96–112)
Creatinine, Ser: 1 mg/dL (ref 0.4–1.5)
GFR: 81.79 mL/min (ref >60.00)
GLUCOSE: 117 mg/dL — AB (ref 70–99)
Potassium: 4.6 mEq/L (ref 3.5–5.1)
Sodium: 130 mEq/L — ABNORMAL LOW (ref 135–145)

## 2013-08-18 MED ORDER — CYCLOBENZAPRINE HCL 10 MG PO TABS: 5.0000 mg | ORAL_TABLET | Freq: Three times a day (TID) | ORAL | Status: DC | PRN

## 2013-08-18 NOTE — Progress Notes (Signed)
Pre visit review using our clinic review tool, if applicable. No additional management support is needed unless otherwise documented below in the visit note. 

## 2013-08-18 NOTE — Progress Notes (Signed)
[place] [address] Phone: [phone] Fax: [phone]  Patient ID: ANTHON Sampson MRN: [id number], DOB: [birth year], 79 y.o. Date of Encounter: 08/18/2013  Primary Physician:  Owens Loffler, MD   Chief Complaint: neck and  back pain   Subjective:   History of Present Illness:  Patrick Sampson is a 79 y.o. very pleasant male patient with h/o Lung cancer and prostate cancer who presents with the following:  Monday night or Tuesday morning and had some sharp pain in the shoulder blade on the right. Went to the chair and went and got better and went into his neck. It has been variable in its intensity and intermittent.  Now into his shoulder blade. Coughs it will hurt. Hurts to turn over. Sleeping in the lazy boy. He had a lot of pain, and difficulty sleeping last night. He was not sure what he could take in terms of medications to make this feel better.    Past Medical History, Surgical History, Social History, Family History, Problem List, Medications, and Allergies have been reviewed and updated if relevant.  Patient Active Problem List   Diagnosis Date Noted  . Systolic murmur 32/95/1884  . Moderate COPD (chronic obstructive pulmonary disease) 06/22/2013  . COPD exacerbation 05/25/2013  . Aortic calcification 12/02/2012  . Hearing loss 12/02/2012  . STEMI, s/p PCI 11/2008 12/02/2012  . Allergic rhinitis due to pollen   . Raynauds disease   . Tinnitus of both ears   . moderate COPD with emphysema 02/24/2012  . COAGULOPATHY 03/20/2010  . HEMORRHOIDS 03/19/2010  . COLONIC POLYPS, ADENOMATOUS, HX OF 03/19/2010  . HYPERTENSION, ESSENTIAL NOS 02/12/2010  . ABDOMINAL BRUIT 01/25/2009  . HYPERLIPIDEMIA-MIXED 12/19/2008  . Tobacco abuse, in remission 12/19/2008  . CAD, NATIVE VESSEL 12/19/2008  . ABDOMINAL AORTIC ANEURYSM 07/11/2007  . PERIPHERAL VASCULAR DISEASE 08/16/2006  . Cancer of lung, upper lobe, Right, in remission 08/16/2006  . Prostate cancer,  in Remission, 12/1998 Seeds 08/16/2006    Past Medical History  Diagnosis Date  . Prostate cancer 2000  . Myocardial infarction 11/2008  . Allergic rhinitis due to pollen   . Arthritis   . Cataract   . Hyperlipidemia   . Raynauds disease   . Tinnitus of both ears   . COPD with emphysema   . CAD (coronary artery disease) 11/2008    a. Inf STEMI 11/2008 => Mid to dist LAD 70-75%, mid CFX occluded, oRCA 50%, mRCA 50%, inf AK, EF 50% => PCI: overlapping BMSs to CFX;  b.  F/u LHC 01/2009:  mid LAD 50%, mid CFX stents patent. There was 50-70% stenosis at the prox edge of the stent; IVUS of the CFX and LAD with no hemodynamically significant stenoses=>Med Rx.;  c.  ETT-Myoview 8/14:  Inf and IL scar, no ischemia, low risk  . AAA (abdominal aortic aneurysm)     a. Abdominal US 01/2011:  2.4 x 2.4 cm. Follow up recommended in 2 years.;  b. Abdominal US (01/2013):  Infrarenal AAA 2.4 x 2.4 cm  . Cancer of lung, upper lobe, Right, in remission 2000    Past Surgical History  Procedure Laterality Date  . Angioplasty / stenting femoral    . Lung lobectomy Right 2000    Right upper lobectomy  . Transperineal implant of radiation seeds w/ ultrasound  2000    for cancer,rad.with seed plantation  . Polypectomy    . Colonoscopy  2007    History   Social History  .  Marital Status: Married    Spouse Name: N/A    Number of Children: N/A  . Years of Education: N/A   Occupational History  . Not on file.   Social History Main Topics  . Smoking status: Former Smoker -- 50 years    Types: Cigarettes    Quit date: 01/20/2007  . Smokeless tobacco: Never Used  . Alcohol Use: 6.0 oz/week    10 Cans of beer per week     Comment: moderate  . Drug Use: No  . Sexual Activity: Not on file   Other Topics Concern  . Not on file   Social History Narrative  . No narrative on file    Family History  Problem Relation Age of Onset  . Colon cancer Sister 66    rectal cancer  . Lung cancer Father 60     smoker and coal mining  . Congestive Heart Failure Mother   . CAD Brother   . Tuberculosis Brother   . Kidney failure Brother     ESRD on dialysis  . Coronary artery disease Brother     s/p CABG    Allergies  Allergen Reactions  . Amoxicillin Rash    Medication list reviewed and updated in full in [place].   Review of Systems:  GEN: No fevers, chills. Nontoxic. Primarily MSK c/o today. MSK: Detailed in the HPI GI: tolerating PO intake without difficulty Neuro: No numbness, parasthesias, or tingling associated. Otherwise the pertinent positives of the ROS are noted above.   Objective:   Physical Examination: BP 112/72  Pulse 60  Temp(Src) 98.2 F (36.8 C) (Oral)  Ht 5' 11.25" (1.81 m)  Wt 151 lb 8 oz (68.72 kg)  BMI 20.98 kg/m2   GEN: WDWN, NAD, Non-toxic, A & O x 3 HEENT: Atraumatic, Normocephalic. Neck supple. No masses, No LAD. Ears and Nose: No external deformity. CV: RRR, No M/G/R. No JVD. No thrill. No extra heart sounds. PULM: CTA B, no wheezes, crackles, rhonchi. No retractions. No resp. distress. No accessory muscle use. EXTR: No c/c/e NEURO Normal gait.  PSYCH: Normally interactive. Conversant. Not depressed or anxious appearing.  Calm demeanor.    CERVICAL SPINE EXAM Range of motion: Flexion, extension, lateral bending, and rotation: Dramatic loss of motion in terms of flexion and extension. Lateral bending also was significant loss of motion. Rotational motion is improved with approximately a 30%. Pain with terminal motion: yes Spinous Processes: NT SCM: NT Upper paracervical muscles: mild TTP Upper traps: NT C5-T1 intact, sensation and motor   Radiology: No results found.  Assessment & Plan:   Neck pain - Plan: DG Cervical Spine Complete, CT Chest W Contrast, DG Cervical Spine Complete  Chest pain, unspecified chest pain type - Plan: CT Chest W Contrast, Basic metabolic panel  Shoulder blade pain - Plan: CT Chest W  Contrast  Neck pain, chest pain, and shoulder blade pain of unclear source and the patient with a history of lung cancer and prostate cancer. Check cervical film. Given history of neoplasm, we will check a CT of the chest with contrast to evaluate lungs, bony structure, and evaluate for potential recurrence of neoplasm or metastasis.  New Prescriptions   CYCLOBENZAPRINE (FLEXERIL) 10 MG TABLET    Take 0.5 tablets (5 mg total) by mouth 3 (three) times daily as needed for muscle spasms.   Modified Medications   No medications on file   Orders Placed This Encounter  Procedures  . DG Cervical Spine Complete  .  CT Chest W Contrast  . Basic metabolic panel   Follow-up: No Follow-up on file. Unless noted above, the patient is to follow-up if symptoms worsen. Red flags were reviewed with the patient.  Signed,  Maud Deed. Adanna Zuckerman, MD, CAQ Sports Medicine   Discontinued Medications   No medications on file   Current Medications at Discharge:   Medication List       This list is accurate as of: 08/18/13  1:52 PM.  Always use your most recent med list.               albuterol 108 (90 BASE) MCG/ACT inhaler  Commonly known as:  PROVENTIL HFA;VENTOLIN HFA  Inhale 2 puffs into the lungs every 6 (six) hours as needed for wheezing.     aspirin 325 MG buffered tablet  Take 325 mg by mouth daily.     CRESTOR 40 MG tablet  Generic drug:  rosuvastatin  TAKE ONE TABLET BY MOUTH ONCE DAILY     cyclobenzaprine 10 MG tablet  Commonly known as:  FLEXERIL  Take 0.5 tablets (5 mg total) by mouth 3 (three) times daily as needed for muscle spasms.     Fluticasone-Salmeterol 250-50 MCG/DOSE Aepb  Commonly known as:  ADVAIR  Inhale 1 puff into the lungs 2 (two) times daily.     lisinopril 2.5 MG tablet  Commonly known as:  PRINIVIL,ZESTRIL  TAKE ONE TABLET BY MOUTH ONCE DAILY     Lutein 10 MG Tabs  Take 10 mg by mouth 2 (two) times daily.     metoprolol tartrate 25 MG tablet  Commonly  known as:  LOPRESSOR  TAKE ONE TABLET BY MOUTH TWICE DAILY     nitroGLYCERIN 0.4 MG SL tablet  Commonly known as:  NITROSTAT  Place 1 tablet (0.4 mg total) under the tongue every 5 (five) minutes as needed for chest pain.     tiotropium 18 MCG inhalation capsule  Commonly known as:  SPIRIVA HANDIHALER  Place 1 capsule (18 mcg total) into inhaler and inhale daily.

## 2013-08-18 NOTE — Patient Instructions (Signed)
Motrin 400 mg recommended TID. (Over the counter Motrin, Advil, or Generic Ibuprofen 200 mg tablets. 2 tablets by mouth 3 times a day.)

## 2013-08-22 ENCOUNTER — Ambulatory Visit (INDEPENDENT_AMBULATORY_CARE_PROVIDER_SITE_OTHER)
Admission: RE | Admit: 2013-08-22 | Discharge: 2013-08-22 | Disposition: A | Discharge status: Home / Self Care | Payer: Medicare Other | Source: Ambulatory Visit | Attending: Family Medicine | Admitting: Family Medicine

## 2013-08-22 DIAGNOSIS — R079 Chest pain, unspecified: Secondary | ICD-10-CM

## 2013-08-22 DIAGNOSIS — M899 Disorder of bone, unspecified: Secondary | ICD-10-CM

## 2013-08-22 DIAGNOSIS — M542 Cervicalgia: Secondary | ICD-10-CM | POA: Diagnosis not present

## 2013-08-22 DIAGNOSIS — J438 Other emphysema: Secondary | ICD-10-CM | POA: Diagnosis not present

## 2013-08-22 DIAGNOSIS — M949 Disorder of cartilage, unspecified: Secondary | ICD-10-CM | POA: Diagnosis not present

## 2013-08-22 DIAGNOSIS — M898X1 Other specified disorders of bone, shoulder: Secondary | ICD-10-CM

## 2013-08-22 MED ORDER — IOHEXOL 300 MG/ML  SOLN
80.0000 mL | Freq: Once | INTRAMUSCULAR | Status: AC | PRN
  Administered 2013-08-22: 80 mL via INTRAVENOUS

## 2013-08-23 ENCOUNTER — Encounter: Payer: Self-pay | Admitting: Family Medicine

## 2013-08-23 NOTE — Telephone Encounter (Signed)
This was already addressed

## 2013-09-04 ENCOUNTER — Ambulatory Visit (INDEPENDENT_AMBULATORY_CARE_PROVIDER_SITE_OTHER): Payer: Medicare Other | Admitting: Cardiology

## 2013-09-04 VITALS — BP 128/60 | HR 67 | Ht 72.0 in | Wt 149.5 lb

## 2013-09-04 DIAGNOSIS — Z79899 Other long term (current) drug therapy: Secondary | ICD-10-CM

## 2013-09-04 DIAGNOSIS — I714 Abdominal aortic aneurysm, without rupture, unspecified: Secondary | ICD-10-CM | POA: Diagnosis not present

## 2013-09-04 DIAGNOSIS — I2510 Atherosclerotic heart disease of native coronary artery without angina pectoris: Secondary | ICD-10-CM

## 2013-09-04 DIAGNOSIS — E785 Hyperlipidemia, unspecified: Secondary | ICD-10-CM

## 2013-09-04 DIAGNOSIS — C3411 Malignant neoplasm of upper lobe, right bronchus or lung: Secondary | ICD-10-CM

## 2013-09-04 DIAGNOSIS — C3410 Malignant neoplasm of upper lobe, unspecified bronchus or lung: Secondary | ICD-10-CM

## 2013-09-04 NOTE — Progress Notes (Signed)
09/10/2013   PCP: Owens Loffler, MD   Chief Complaint  Patient presents with  . Annual Exam    patient reports no problems since last visit.    Primary Cardiologist: Dr. Thresa Ross   HPI:  79 year old male presents today for cardiology followup for CAD.    He has a hz of CAD, HTN, HL, AAA. He presented to the hospital in 11/2008 with an inferior STEMI. LHC 11/2008: Mid to distal LAD 70-75%, mid CFX occluded, ostial RCA 50%, mid RCA 50%, inferior AK, EF 50%. He underwent overlapping bare-metal stents to the CFX at that time. Follow up [facility] 01/2009 demonstrated mid LAD 50%, mid CFX stents patent. There was 50-70% stenosis at the proximal edge of the stent. IVUS of the CFX and LAD demonstrated no hemodynamically significant stenoses. Continued medical therapy was recommended. Abdominal US 01/2011: 2.4 x 2.4 cm. Follow up recommended in 2 years. Last seen by Dr. Stanford Breed 09/2010. Followed up with Richardson Dopp, PA-C 08/2012.  Nuc study was completed without changes, continued medical therapy.  AAA duplex was done 01/26/13 with stable mild dilatation of AA.    He also has a hx. Of lung cancer with RU lobectomy- recent CT of chest with stable RU lobectomy and no local recurrence of cancerRight middle and right lower lobe scarring. Paraseptal and centrilobular emphysematous change. Herniation of a small amount of pulmonary parenchyma through a surgical defect within the anterior right upper hemithorax. Also hx of prostate cancer.    He has had bronchitis and neck pain, the pain was resolved with muscle relaxers.    Today he denies chest pain and only mild SOB recovering from bronchitis.  He would like to decrease his crestor to 20 mg due to cost.   Lipid Panel     Component Value Date/Time   CHOL 102 02/22/2013 0849   TRIG 73.0 02/22/2013 0849   HDL 42.90 02/22/2013 0849   CHOLHDL 2 02/22/2013 0849   VLDL 14.6 02/22/2013 0849   LDLCALC 45 02/22/2013 0849       Allergies  Allergen  Reactions  . Amoxicillin Rash    Current Outpatient Prescriptions  Medication Sig Dispense Refill  . albuterol (PROVENTIL HFA;VENTOLIN HFA) 108 (90 BASE) MCG/ACT inhaler Inhale 2 puffs into the lungs every 6 (six) hours as needed for wheezing.  1 Inhaler  5  . aspirin 325 MG buffered tablet Take 325 mg by mouth daily.        . CRESTOR 40 MG tablet TAKE ONE TABLET BY MOUTH ONCE DAILY  30 tablet  0  . Fluticasone-Salmeterol (ADVAIR) 250-50 MCG/DOSE AEPB Inhale 1 puff into the lungs 2 (two) times daily.  60 each  11  . lisinopril (PRINIVIL,ZESTRIL) 2.5 MG tablet TAKE ONE TABLET BY MOUTH ONCE DAILY  90 tablet  2  . Lutein 10 MG TABS Take 10 mg by mouth 2 (two) times daily.        . metoprolol tartrate (LOPRESSOR) 25 MG tablet TAKE ONE TABLET BY MOUTH TWICE DAILY  180 tablet  2  . nitroGLYCERIN (NITROSTAT) 0.4 MG SL tablet Place 1 tablet (0.4 mg total) under the tongue every 5 (five) minutes as needed for chest pain.  25 tablet  12  . tiotropium (SPIRIVA HANDIHALER) 18 MCG inhalation capsule Place 1 capsule (18 mcg total) into inhaler and inhale daily.  30 capsule  12   No current facility-administered medications for this visit.    Past  Medical History  Diagnosis Date  . Prostate cancer 2000  . Myocardial infarction 11/2008  . Allergic rhinitis due to pollen   . Arthritis   . Cataract   . Hyperlipidemia   . Raynauds disease   . Tinnitus of both ears   . COPD with emphysema   . CAD (coronary artery disease) 11/2008    a. Inf STEMI 11/2008 => Mid to dist LAD 70-75%, mid CFX occluded, oRCA 50%, mRCA 50%, inf AK, EF 50% => PCI: overlapping BMSs to CFX;  b.  F/u LHC 01/2009:  mid LAD 50%, mid CFX stents patent. There was 50-70% stenosis at the prox edge of the stent; IVUS of the CFX and LAD with no hemodynamically significant stenoses=>Med Rx.;  c.  ETT-Myoview 8/14:  Inf and IL scar, no ischemia, low risk  . AAA (abdominal aortic aneurysm)     a. Abdominal US 01/2011:  2.4 x 2.4 cm. Follow up  recommended in 2 years.;  b. Abdominal US (01/2013):  Infrarenal AAA 2.4 x 2.4 cm  . Cancer of lung, upper lobe, Right, in remission 2000    Past Surgical History  Procedure Laterality Date  . Angioplasty / stenting femoral    . Lung lobectomy Right 2000    Right upper lobectomy  . Transperineal implant of radiation seeds w/ ultrasound  2000    for cancer,rad.with seed plantation  . Polypectomy    . Colonoscopy  2007    DXI:PJASNKN:LZJQBH bronchitis,no fevers, no weight changes Skin:no rashes or ulcers HEENT:no blurred vision, no congestion CV:see HPI PUL:see HPI GI:no diarrhea constipation or melena, no indigestion GU:no hematuria, no dysuria MS:no joint pain, no claudication, neck pain resolved Neuro:no syncope, no lightheadedness Endo:no diabetes, no thyroid disease  Wt Readings from Last 3 Encounters:  09/04/13 149 lb 8 oz (67.813 kg)  08/18/13 151 lb 8 oz (68.72 kg)  06/22/13 143 lb 4 oz (64.978 kg)    PHYSICAL EXAM BP 128/60  Pulse 67  Ht 6' (1.829 m)  Wt 149 lb 8 oz (67.813 kg)  BMI 20.27 kg/m2 General:Pleasant affect, NAD Skin:Warm and dry, brisk capillary refill HEENT:normocephalic, sclera clear, mucus membranes moist Neck:supple, no JVD, no bruits  Heart:S1S2 RRR without murmur, gallup, rub or click Lungs:clear without rales, rhonchi, or wheezes ALP:FXTK, non tender, + BS, do not palpate liver spleen or masses Ext:no lower ext edema, 2+ pedal pulses, 2+ radial pulses Neuro:alert and oriented, MAE, follows commands, + facial symmetry  EKG:SR no acute changes  ASSESSMENT AND PLAN CAD, NATIVE VESSEL In 11/2008 with an inferior STEMI. LHC 11/2008: Mid to distal LAD 70-75%, mid CFX occluded, ostial RCA 50%, mid RCA 50%, inferior AK, EF 50%. He underwent overlapping bare-metal stents to the CFX at that time. Follow up [facility] 01/2009 demonstrated mid LAD 50%, mid CFX stents patent. There was 50-70% stenosis at the proximal edge of the stent. IVUS of the CFX and LAD  demonstrated no hemodynamically significant stenoses. Continued medical therapy was recommended.  Last nuc study 08/2012 was stable.  No recurrent chest pain.   ABDOMINAL AORTIC ANEURYSM Stable  ultrsound  01/2012  HYPERLIPIDEMIA-MIXED Stable in Feb.  Would like to decrease crestor due to cost. Will decrease to 20 mg and recheck lipids in 6 weeks.   Cancer of lung, upper lobe, Right, in remission Recent CT of chest stable.

## 2013-09-04 NOTE — Patient Instructions (Signed)
Decrease crestor to 20 mg daily  Continue heart health diet  Check lipids in oct  Abdominal ultrasound in Jan   Follow up with Dr. Thresa Ross in [place] or Jan after ultrasound,

## 2013-09-10 ENCOUNTER — Encounter: Payer: Self-pay | Admitting: Cardiology

## 2013-09-10 NOTE — Assessment & Plan Note (Signed)
Stable in Feb.  Would like to decrease crestor due to cost. Will decrease to 20 mg and recheck lipids in 6 weeks.

## 2013-09-10 NOTE — Assessment & Plan Note (Signed)
Recent CT of chest stable.

## 2013-09-10 NOTE — Assessment & Plan Note (Signed)
In 11/2008 with an inferior STEMI. LHC 11/2008: Mid to distal LAD 70-75%, mid CFX occluded, ostial RCA 50%, mid RCA 50%, inferior AK, EF 50%. He underwent overlapping bare-metal stents to the CFX at that time. Follow up [facility] 01/2009 demonstrated mid LAD 50%, mid CFX stents patent. There was 50-70% stenosis at the proximal edge of the stent. IVUS of the CFX and LAD demonstrated no hemodynamically significant stenoses. Continued medical therapy was recommended.  Last nuc study 08/2012 was stable.  No recurrent chest pain.

## 2013-09-10 NOTE — Assessment & Plan Note (Signed)
Stable  ultrsound  01/2012

## 2013-09-15 ENCOUNTER — Other Ambulatory Visit: Payer: Self-pay | Admitting: Physician Assistant

## 2013-09-19 HISTORY — PX: CATARACT EXTRACTION W/PHACO: SHX586

## 2013-10-09 DIAGNOSIS — H2510 Age-related nuclear cataract, unspecified eye: Secondary | ICD-10-CM | POA: Diagnosis not present

## 2013-10-09 DIAGNOSIS — H25019 Cortical age-related cataract, unspecified eye: Secondary | ICD-10-CM | POA: Diagnosis not present

## 2013-10-09 DIAGNOSIS — Z961 Presence of intraocular lens: Secondary | ICD-10-CM | POA: Diagnosis not present

## 2013-10-11 ENCOUNTER — Other Ambulatory Visit: Payer: Self-pay

## 2013-10-11 DIAGNOSIS — Z23 Encounter for immunization: Secondary | ICD-10-CM | POA: Diagnosis not present

## 2013-10-11 MED ORDER — METOPROLOL TARTRATE 25 MG PO TABS: ORAL_TABLET | ORAL | Status: DC

## 2013-10-16 DIAGNOSIS — H25019 Cortical age-related cataract, unspecified eye: Secondary | ICD-10-CM | POA: Diagnosis not present

## 2013-10-16 DIAGNOSIS — H2510 Age-related nuclear cataract, unspecified eye: Secondary | ICD-10-CM | POA: Diagnosis not present

## 2013-10-18 DIAGNOSIS — H2510 Age-related nuclear cataract, unspecified eye: Secondary | ICD-10-CM | POA: Diagnosis not present

## 2013-10-18 DIAGNOSIS — H25019 Cortical age-related cataract, unspecified eye: Secondary | ICD-10-CM | POA: Diagnosis not present

## 2013-11-07 ENCOUNTER — Other Ambulatory Visit: Payer: Self-pay

## 2013-11-07 MED ORDER — LISINOPRIL 2.5 MG PO TABS: ORAL_TABLET | ORAL | Status: DC

## 2013-11-21 ENCOUNTER — Telehealth: Payer: Self-pay | Admitting: Cardiology

## 2013-11-21 NOTE — Telephone Encounter (Signed)
Please call,last time he saw Mickel Baas she decreased his Crestor in August. He thinks he need lab work so he can check his Cholesterol.

## 2013-11-21 NOTE — Telephone Encounter (Signed)
Patient notified that Mickel Baas, NP had ordered labs to be done in October and that orders have been placed and he can go to [place] lab at his convenience (fasting)

## 2013-11-22 ENCOUNTER — Telehealth: Payer: Self-pay | Admitting: Cardiology

## 2013-11-22 DIAGNOSIS — Z79899 Other long term (current) drug therapy: Secondary | ICD-10-CM | POA: Diagnosis not present

## 2013-11-22 DIAGNOSIS — E785 Hyperlipidemia, unspecified: Secondary | ICD-10-CM | POA: Diagnosis not present

## 2013-11-22 LAB — LIPID PANEL
CHOLESTEROL: 118 mg/dL (ref 0–200)
HDL: 45 mg/dL (ref >39)
LDL Cholesterol: 55 mg/dL (ref 0–99)
TRIGLYCERIDES: 91 mg/dL (ref <150)
Total CHOL/HDL Ratio: 2.6 Ratio
VLDL: 18 mg/dL (ref 0–40)

## 2013-11-22 LAB — COMPREHENSIVE METABOLIC PANEL
ALT: 15 U/L (ref 0–53)
AST: 19 U/L (ref 0–37)
Albumin: 4.3 g/dL (ref 3.5–5.2)
Alkaline Phosphatase: 85 U/L (ref 39–117)
BUN: 16 mg/dL (ref 6–23)
CO2: 25 mEq/L (ref 19–32)
Calcium: 9.5 mg/dL (ref 8.4–10.5)
Chloride: 98 mEq/L (ref 96–112)
Creat: 0.89 mg/dL (ref 0.50–1.35)
Glucose, Bld: 101 mg/dL — ABNORMAL HIGH (ref 70–99)
Potassium: 5.2 mEq/L (ref 3.5–5.3)
Sodium: 133 mEq/L — ABNORMAL LOW (ref 135–145)
Total Bilirubin: 0.6 mg/dL (ref 0.2–1.2)
Total Protein: 7.3 g/dL (ref 6.0–8.3)

## 2013-11-22 NOTE — Telephone Encounter (Signed)
Spoke to [place]  informed patient has labs order from 08/2013 She states she sees labs order and will draw

## 2013-11-22 NOTE — Telephone Encounter (Signed)
Patrick Sampson called in stating that the pt is currently in the lab and she says that no labs have been put in for the pt. Please call  Thanks

## 2013-11-26 ENCOUNTER — Encounter: Payer: Self-pay | Admitting: Family Medicine

## 2013-12-02 IMAGING — CR DG CHEST 2V
3 series · 3 of 3 positions shown · non-contrast
Comparison: Chest x-ray of 02/12/2010

CLINICAL DATA: History of lung carcinoma, follow-up

CHEST - 2 VIEW

[view not recorded (1 of 3)]
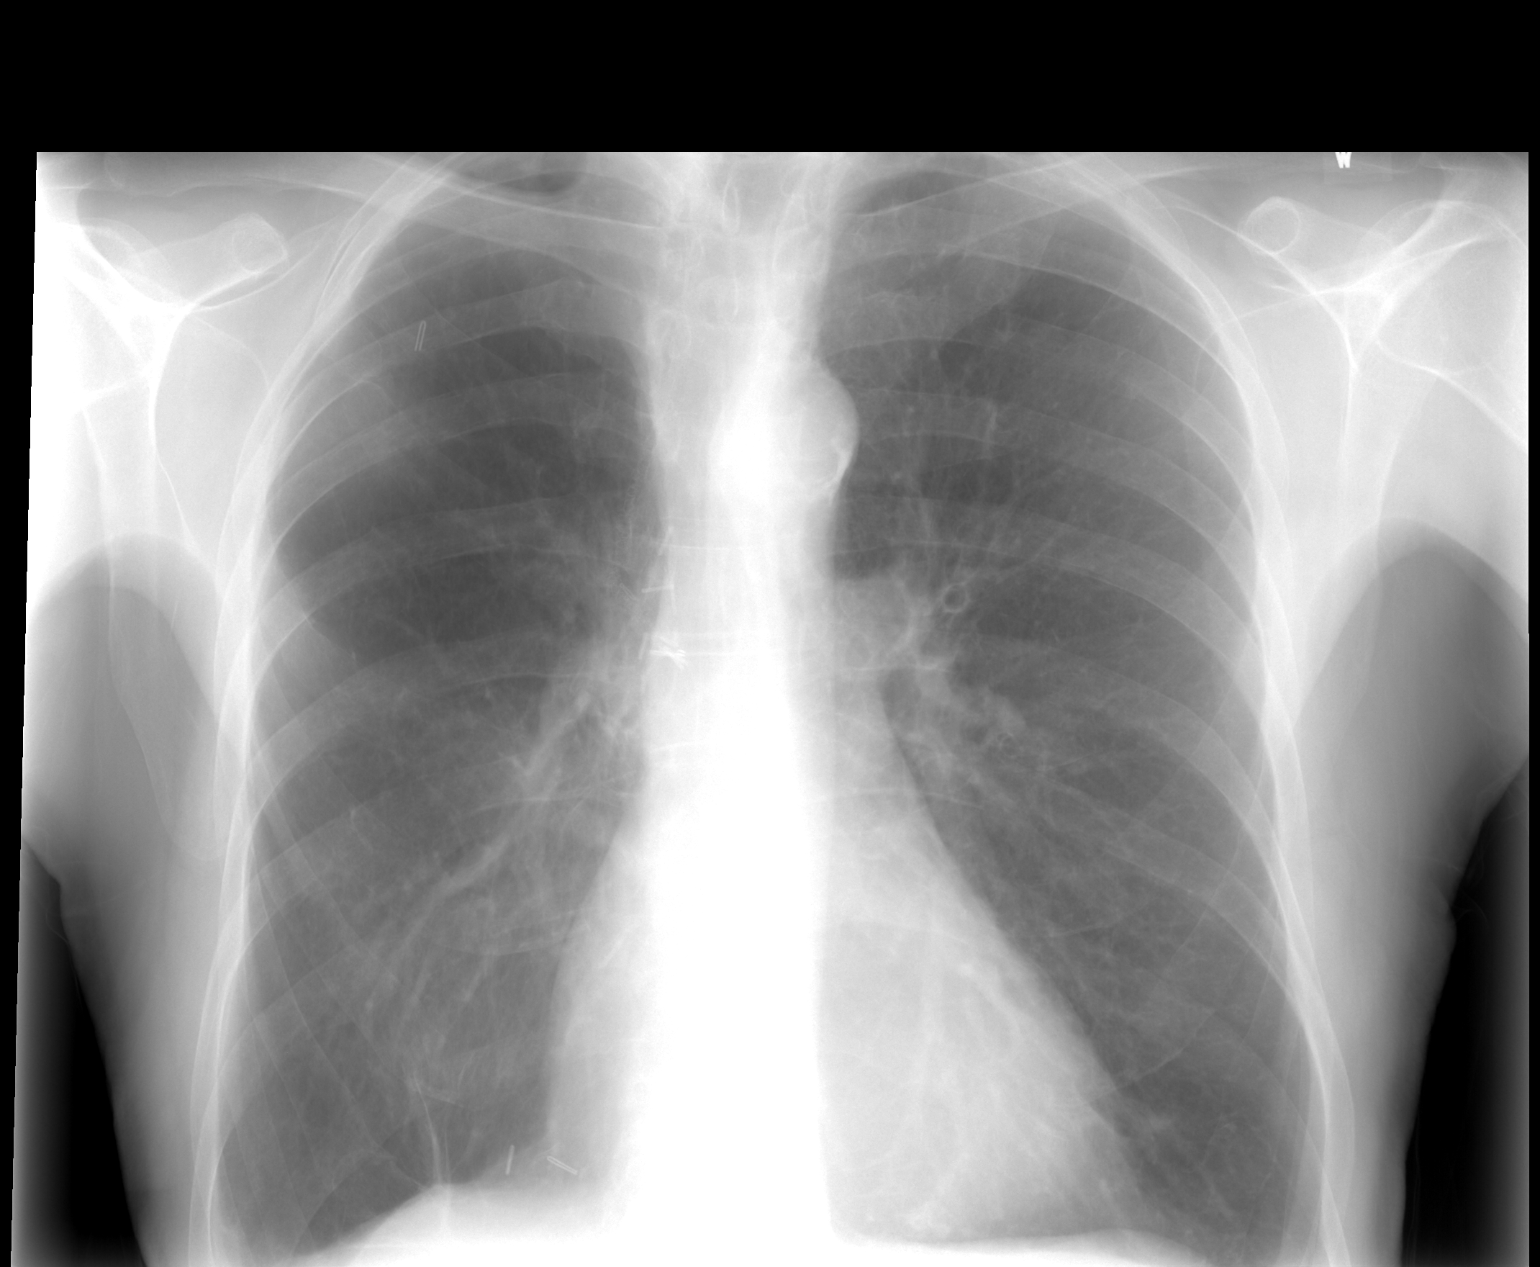

[view not recorded (2 of 3)]
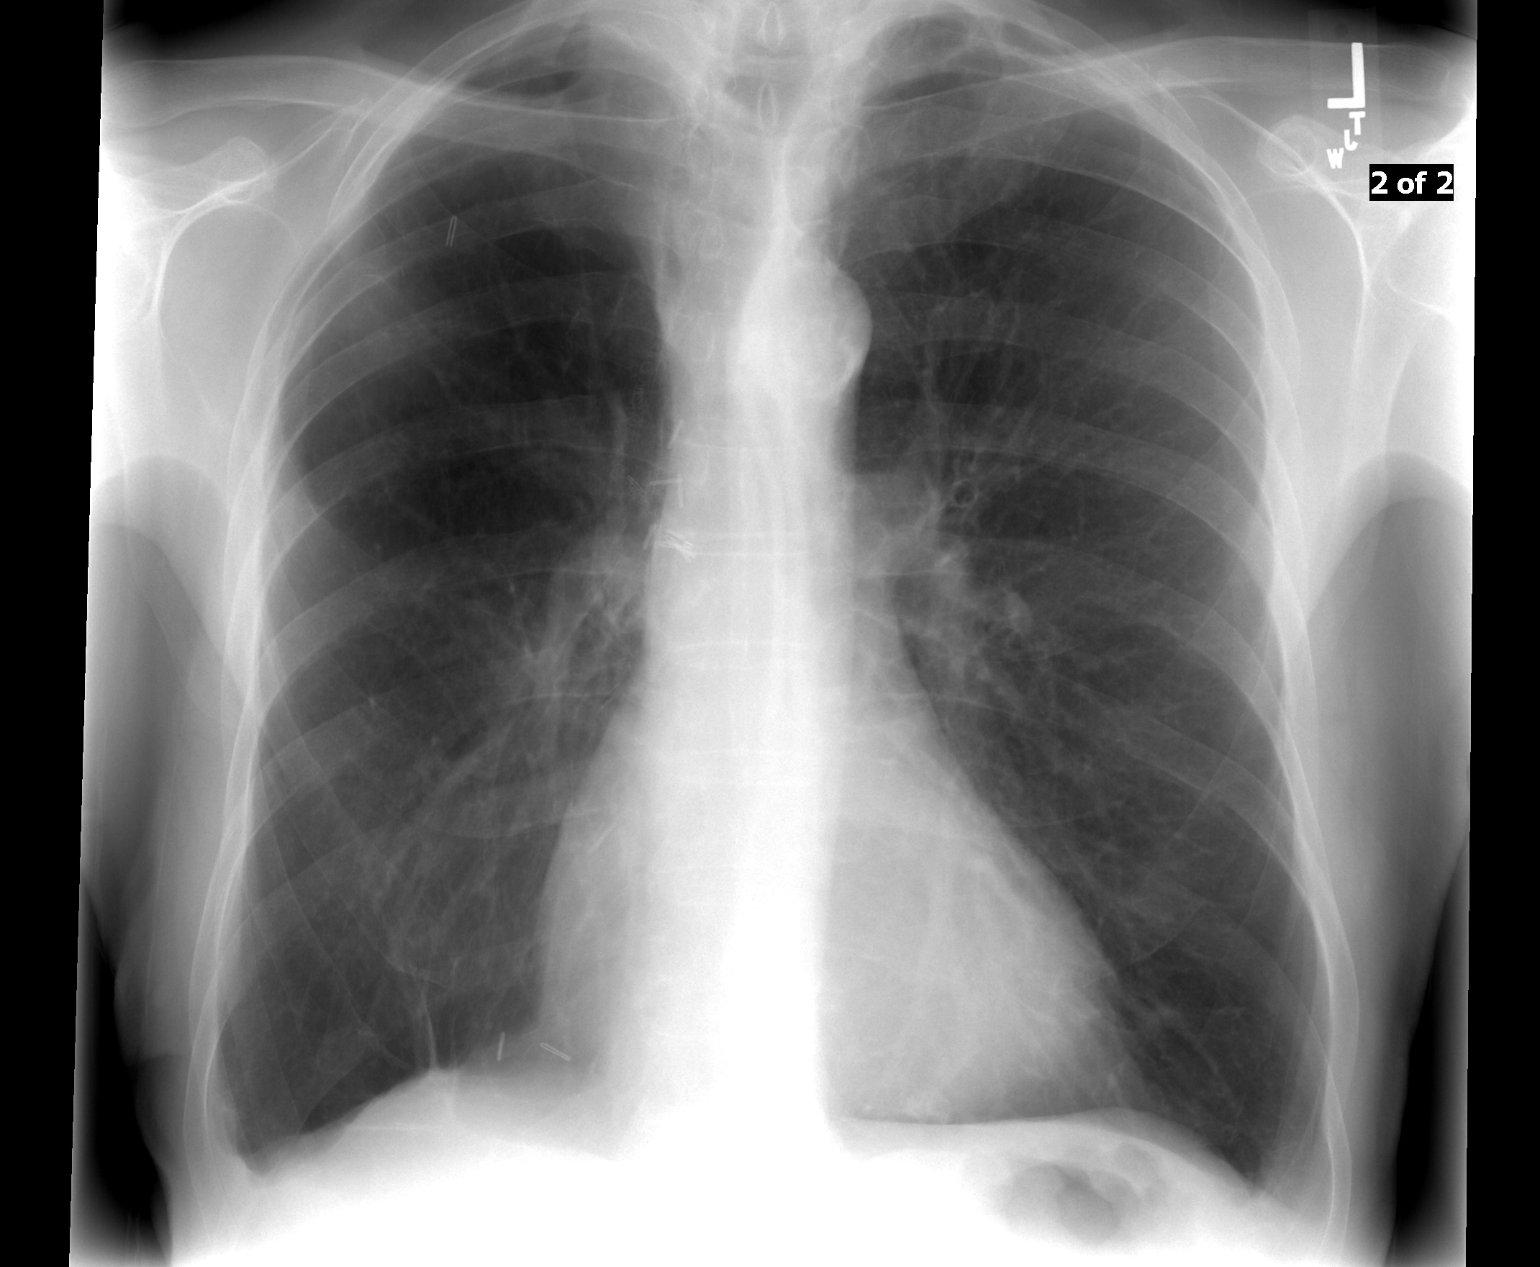

[view not recorded (3 of 3)]
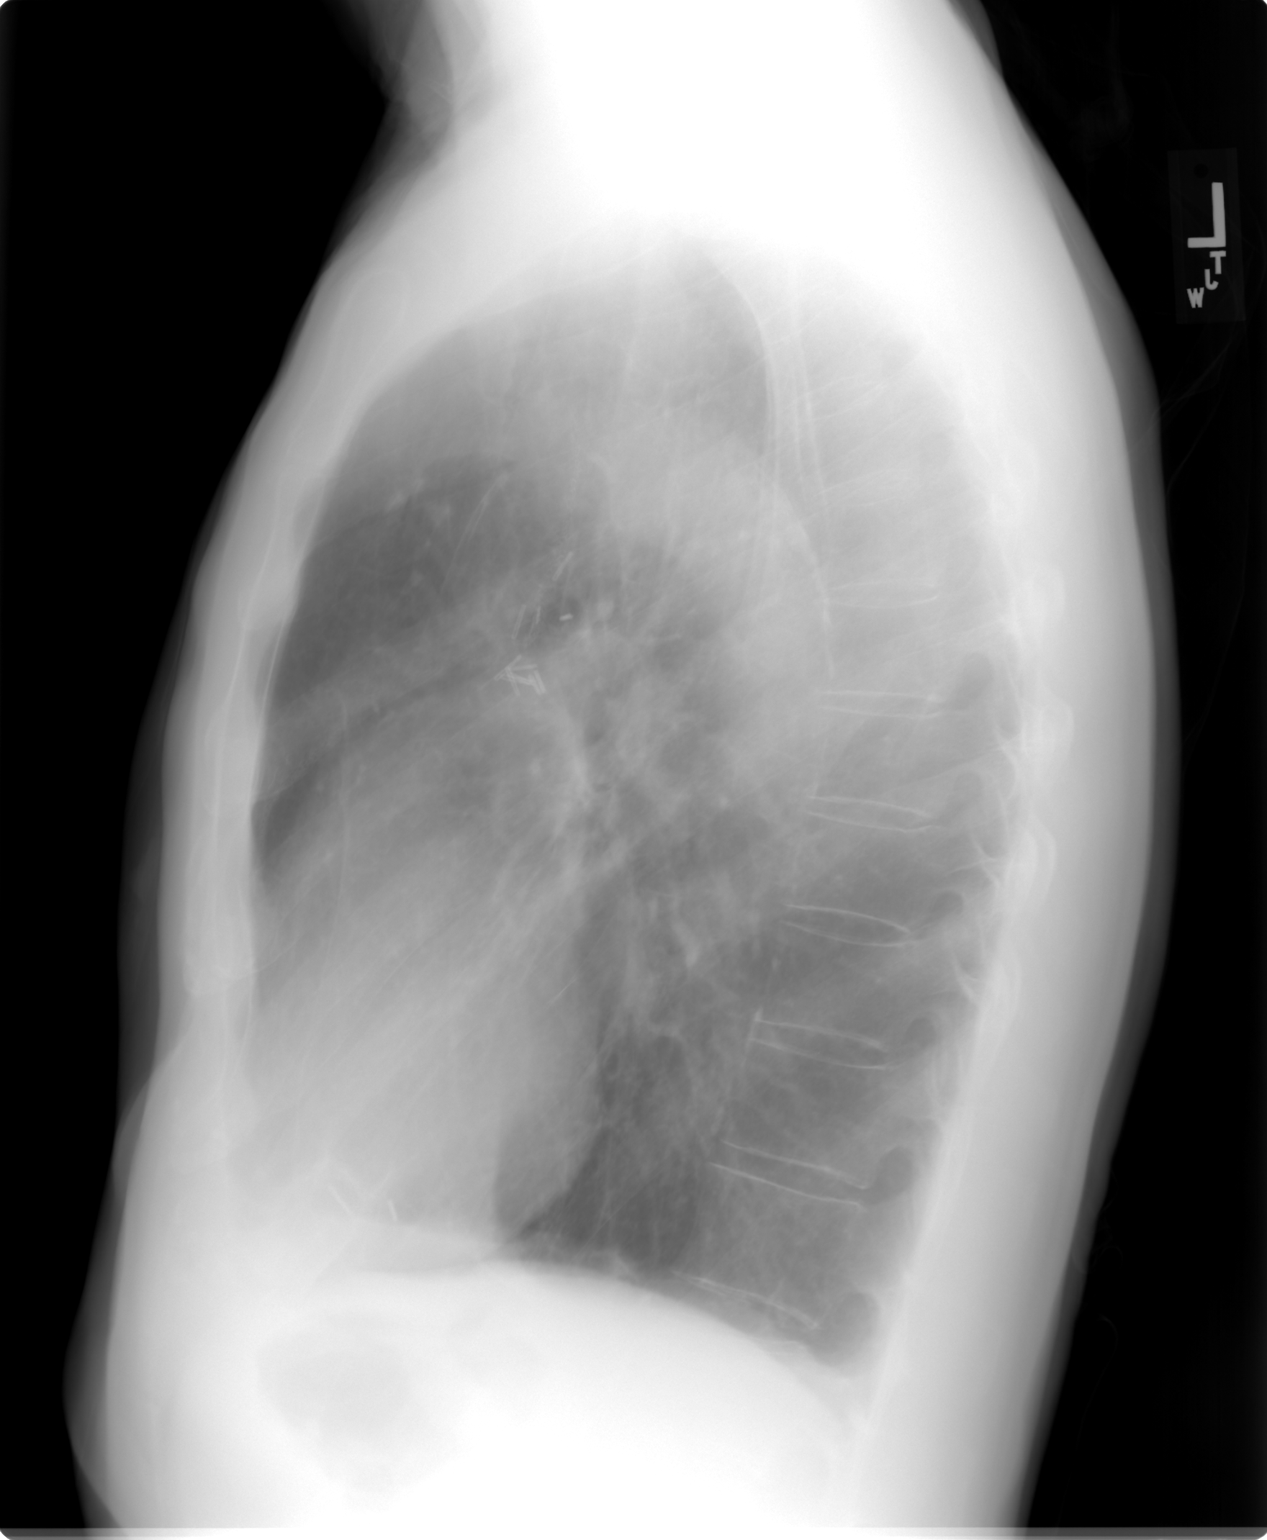

[3 of 3 positions shown; findings below may reference images not displayed]

FINDINGS: The lungs are very hyperaerated consistent with severe
emphysema.  No focal infiltrate or effusion is seen.  No evidence
of recurrence of lung carcinoma is noted.  The heart is mildly
enlarged and stable.  No acute skeletal abnormality is seen.
IMPRESSION: Severe emphysema.  No evidence of recurrence.  No active lung
disease..

## 2014-01-15 ENCOUNTER — Other Ambulatory Visit: Payer: Self-pay | Admitting: Cardiology

## 2014-01-15 MED ORDER — ROSUVASTATIN CALCIUM 40 MG PO TABS: 20.0000 mg | ORAL_TABLET | Freq: Every day | ORAL | Status: DC

## 2014-01-15 NOTE — Telephone Encounter (Signed)
Rx(s) sent to pharmacy electronically.  

## 2014-01-15 NOTE — Telephone Encounter (Signed)
Pt says he still have not received his Crestor. Would you please call it in today to [phone].

## 2014-02-26 DIAGNOSIS — Z961 Presence of intraocular lens: Secondary | ICD-10-CM | POA: Diagnosis not present

## 2014-03-08 NOTE — Progress Notes (Signed)
HPI: FU CAD, HTN, HL, AAA. He presented to the hospital in 11/2008 with an inferior STEMI. LHC 11/2008: Mid to distal LAD 70-75%, mid CFX occluded, ostial RCA 50%, mid RCA 50%, inferior AK, EF 50%. He underwent overlapping bare-metal stents to the CFX at that time. Follow up [facility] 01/2009 demonstrated mid LAD 50%, mid CFX stents patent. There was 50-70% stenosis at the proximal edge of the stent. IVUS of the CFX and LAD demonstrated no hemodynamically significant stenoses. Continued medical therapy was recommended. Nuclear study 8/14 showed inferior scar and no ischemia. Abdominal US 1/15: 2.4 x 2.4 cm. Follow up recommended in 2 years. Since last seen, the patient denies any dyspnea on exertion, orthopnea, PND, pedal edema, palpitations, syncope or chest pain.    Current Outpatient Prescriptions  Medication Sig Dispense Refill  . albuterol (PROVENTIL HFA;VENTOLIN HFA) 108 (90 BASE) MCG/ACT inhaler Inhale 2 puffs into the lungs every 6 (six) hours as needed for wheezing. 1 Inhaler 5  . aspirin 325 MG buffered tablet Take 325 mg by mouth daily.      . Fluticasone-Salmeterol (ADVAIR) 250-50 MCG/DOSE AEPB Inhale 1 puff into the lungs 2 (two) times daily. 60 each 11  . lisinopril (PRINIVIL,ZESTRIL) 2.5 MG tablet TAKE ONE TABLET BY MOUTH ONCE DAILY 90 tablet 2  . Lutein 10 MG TABS Take 10 mg by mouth 2 (two) times daily.      . metoprolol tartrate (LOPRESSOR) 25 MG tablet TAKE ONE TABLET BY MOUTH TWICE DAILY 180 tablet 2  . nitroGLYCERIN (NITROSTAT) 0.4 MG SL tablet Place 1 tablet (0.4 mg total) under the tongue every 5 (five) minutes as needed for chest pain. 25 tablet 12  . rosuvastatin (CRESTOR) 40 MG tablet Take 0.5 tablets (20 mg total) by mouth daily. 15 tablet 11  . tiotropium (SPIRIVA HANDIHALER) 18 MCG inhalation capsule Place 1 capsule (18 mcg total) into inhaler and inhale daily. 30 capsule 12   No current facility-administered medications for this visit.     Past Medical History    Diagnosis Date  . Prostate cancer 2000  . Myocardial infarction 11/2008  . Allergic rhinitis due to pollen   . Arthritis   . Cataract   . Hyperlipidemia   . Raynauds disease   . Tinnitus of both ears   . COPD with emphysema   . CAD (coronary artery disease) 11/2008    a. Inf STEMI 11/2008 => Mid to dist LAD 70-75%, mid CFX occluded, oRCA 50%, mRCA 50%, inf AK, EF 50% => PCI: overlapping BMSs to CFX;  b.  F/u LHC 01/2009:  mid LAD 50%, mid CFX stents patent. There was 50-70% stenosis at the prox edge of the stent; IVUS of the CFX and LAD with no hemodynamically significant stenoses=>Med Rx.;  c.  ETT-Myoview 8/14:  Inf and IL scar, no ischemia, low risk  . AAA (abdominal aortic aneurysm)     a. Abdominal US 01/2011:  2.4 x 2.4 cm. Follow up recommended in 2 years.;  b. Abdominal US (01/2013):  Infrarenal AAA 2.4 x 2.4 cm  . Cancer of lung, upper lobe, Right, in remission 2000    Past Surgical History  Procedure Laterality Date  . Angioplasty / stenting femoral    . Lung lobectomy Right 2000    Right upper lobectomy  . Transperineal implant of radiation seeds w/ ultrasound  2000    for cancer,rad.with seed plantation  . Polypectomy    . Colonoscopy  2007    History  Social History  . Marital Status: Married    Spouse Name: N/A  . Number of Children: N/A  . Years of Education: N/A   Occupational History  . Not on file.   Social History Main Topics  . Smoking status: Former Smoker -- 50 years    Types: Cigarettes    Quit date: 01/20/2007  . Smokeless tobacco: Never Used  . Alcohol Use: 6.0 oz/week    10 Cans of beer per week     Comment: moderate  . Drug Use: No  . Sexual Activity: Not on file   Other Topics Concern  . Not on file   Social History Narrative    ROS: no fevers or chills, productive cough, hemoptysis, dysphasia, odynophagia, melena, hematochezia, dysuria, hematuria, rash, seizure activity, orthopnea, PND, pedal edema, claudication. Remaining systems  are negative.  Physical Exam: Well-developed well-nourished in no acute distress.  Skin is warm and dry.  HEENT is normal.  Neck is supple.  Chest is clear to auscultation with normal expansion.  Cardiovascular exam is regular rate and rhythm.  Abdominal exam nontender or distended. No masses palpated. Extremities show no edema. neuro grossly intact  ECG sinus rhythm at a rate of 63. Right bundle branch block.

## 2014-03-09 ENCOUNTER — Encounter: Payer: Self-pay | Admitting: Cardiology

## 2014-03-09 ENCOUNTER — Ambulatory Visit (INDEPENDENT_AMBULATORY_CARE_PROVIDER_SITE_OTHER): Payer: Medicare Other | Admitting: Cardiology

## 2014-03-09 VITALS — BP 126/60 | HR 63 | Ht 71.0 in | Wt 162.9 lb

## 2014-03-09 DIAGNOSIS — I2510 Atherosclerotic heart disease of native coronary artery without angina pectoris: Secondary | ICD-10-CM

## 2014-03-09 DIAGNOSIS — I714 Abdominal aortic aneurysm, without rupture, unspecified: Secondary | ICD-10-CM

## 2014-03-09 DIAGNOSIS — I10 Essential (primary) hypertension: Secondary | ICD-10-CM

## 2014-03-09 NOTE — Assessment & Plan Note (Signed)
Continue aspirin and statin. 

## 2014-03-09 NOTE — Assessment & Plan Note (Signed)
Follow-up abdominal ultrasound January 2017.

## 2014-03-09 NOTE — Assessment & Plan Note (Signed)
Blood pressure controlled. Continue present medications. 

## 2014-03-09 NOTE — Patient Instructions (Signed)
Your physician wants you to follow-up in: ONE YEAR WITH DR CRENSHAW You will receive a reminder letter in the mail two months in advance. If you don't receive a letter, please call our office to schedule the follow-up appointment.  

## 2014-03-09 NOTE — Assessment & Plan Note (Signed)
Continue statin. 

## 2014-04-09 ENCOUNTER — Emergency Department (HOSPITAL_COMMUNITY): Payer: Medicare Other

## 2014-04-09 ENCOUNTER — Encounter (HOSPITAL_COMMUNITY): Payer: Self-pay

## 2014-04-09 ENCOUNTER — Other Ambulatory Visit (HOSPITAL_COMMUNITY): Payer: Self-pay

## 2014-04-09 ENCOUNTER — Encounter (HOSPITAL_COMMUNITY)
Admission: EM | Disposition: A | Discharge status: Home / Self Care | Payer: Self-pay | Source: Home / Self Care | Attending: Cardiology

## 2014-04-09 ENCOUNTER — Inpatient Hospital Stay (HOSPITAL_COMMUNITY)
Admission: EM | Admit: 2014-04-09 | Discharge: 2014-04-11 | DRG: 243 | Disposition: A | Discharge status: Home / Self Care | Payer: Medicare Other | Attending: Cardiology | Admitting: Cardiology

## 2014-04-09 DIAGNOSIS — R404 Transient alteration of awareness: Secondary | ICD-10-CM | POA: Diagnosis not present

## 2014-04-09 DIAGNOSIS — I517 Cardiomegaly: Secondary | ICD-10-CM | POA: Diagnosis not present

## 2014-04-09 DIAGNOSIS — M542 Cervicalgia: Secondary | ICD-10-CM | POA: Diagnosis not present

## 2014-04-09 DIAGNOSIS — J439 Emphysema, unspecified: Secondary | ICD-10-CM | POA: Diagnosis not present

## 2014-04-09 DIAGNOSIS — Z8249 Family history of ischemic heart disease and other diseases of the circulatory system: Secondary | ICD-10-CM

## 2014-04-09 DIAGNOSIS — Z7982 Long term (current) use of aspirin: Secondary | ICD-10-CM

## 2014-04-09 DIAGNOSIS — I714 Abdominal aortic aneurysm, without rupture, unspecified: Secondary | ICD-10-CM | POA: Diagnosis present

## 2014-04-09 DIAGNOSIS — I10 Essential (primary) hypertension: Secondary | ICD-10-CM | POA: Diagnosis present

## 2014-04-09 DIAGNOSIS — I252 Old myocardial infarction: Secondary | ICD-10-CM

## 2014-04-09 DIAGNOSIS — R55 Syncope and collapse: Secondary | ICD-10-CM | POA: Diagnosis not present

## 2014-04-09 DIAGNOSIS — I700 Atherosclerosis of aorta: Secondary | ICD-10-CM | POA: Diagnosis not present

## 2014-04-09 DIAGNOSIS — Z959 Presence of cardiac and vascular implant and graft, unspecified: Secondary | ICD-10-CM

## 2014-04-09 DIAGNOSIS — E785 Hyperlipidemia, unspecified: Secondary | ICD-10-CM | POA: Diagnosis present

## 2014-04-09 DIAGNOSIS — Z801 Family history of malignant neoplasm of trachea, bronchus and lung: Secondary | ICD-10-CM

## 2014-04-09 DIAGNOSIS — C61 Malignant neoplasm of prostate: Secondary | ICD-10-CM | POA: Diagnosis present

## 2014-04-09 DIAGNOSIS — I4510 Unspecified right bundle-branch block: Secondary | ICD-10-CM | POA: Diagnosis present

## 2014-04-09 DIAGNOSIS — S199XXA Unspecified injury of neck, initial encounter: Secondary | ICD-10-CM | POA: Diagnosis not present

## 2014-04-09 DIAGNOSIS — Z87891 Personal history of nicotine dependence: Secondary | ICD-10-CM | POA: Diagnosis not present

## 2014-04-09 DIAGNOSIS — Z881 Allergy status to other antibiotic agents status: Secondary | ICD-10-CM

## 2014-04-09 DIAGNOSIS — J449 Chronic obstructive pulmonary disease, unspecified: Secondary | ICD-10-CM | POA: Diagnosis present

## 2014-04-09 DIAGNOSIS — I340 Nonrheumatic mitral (valve) insufficiency: Secondary | ICD-10-CM | POA: Diagnosis not present

## 2014-04-09 DIAGNOSIS — C3411 Malignant neoplasm of upper lobe, right bronchus or lung: Secondary | ICD-10-CM | POA: Diagnosis present

## 2014-04-09 DIAGNOSIS — Z79899 Other long term (current) drug therapy: Secondary | ICD-10-CM

## 2014-04-09 DIAGNOSIS — C3410 Malignant neoplasm of upper lobe, unspecified bronchus or lung: Secondary | ICD-10-CM | POA: Diagnosis present

## 2014-04-09 DIAGNOSIS — W19XXXA Unspecified fall, initial encounter: Secondary | ICD-10-CM

## 2014-04-09 DIAGNOSIS — N289 Disorder of kidney and ureter, unspecified: Secondary | ICD-10-CM | POA: Diagnosis present

## 2014-04-09 DIAGNOSIS — Z7951 Long term (current) use of inhaled steroids: Secondary | ICD-10-CM

## 2014-04-09 DIAGNOSIS — I2510 Atherosclerotic heart disease of native coronary artery without angina pectoris: Secondary | ICD-10-CM | POA: Diagnosis present

## 2014-04-09 DIAGNOSIS — H9313 Tinnitus, bilateral: Secondary | ICD-10-CM | POA: Diagnosis present

## 2014-04-09 DIAGNOSIS — Z955 Presence of coronary angioplasty implant and graft: Secondary | ICD-10-CM

## 2014-04-09 DIAGNOSIS — J301 Allergic rhinitis due to pollen: Secondary | ICD-10-CM | POA: Diagnosis present

## 2014-04-09 DIAGNOSIS — I442 Atrioventricular block, complete: Principal | ICD-10-CM | POA: Diagnosis present

## 2014-04-09 DIAGNOSIS — Z950 Presence of cardiac pacemaker: Secondary | ICD-10-CM | POA: Diagnosis not present

## 2014-04-09 DIAGNOSIS — S0990XA Unspecified injury of head, initial encounter: Secondary | ICD-10-CM | POA: Diagnosis not present

## 2014-04-09 DIAGNOSIS — I255 Ischemic cardiomyopathy: Secondary | ICD-10-CM | POA: Diagnosis not present

## 2014-04-09 DIAGNOSIS — I7300 Raynaud's syndrome without gangrene: Secondary | ICD-10-CM | POA: Diagnosis present

## 2014-04-09 DIAGNOSIS — J9811 Atelectasis: Secondary | ICD-10-CM | POA: Diagnosis not present

## 2014-04-09 DIAGNOSIS — J984 Other disorders of lung: Secondary | ICD-10-CM | POA: Diagnosis not present

## 2014-04-09 DIAGNOSIS — E780 Pure hypercholesterolemia: Secondary | ICD-10-CM | POA: Diagnosis present

## 2014-04-09 DIAGNOSIS — R0600 Dyspnea, unspecified: Secondary | ICD-10-CM

## 2014-04-09 DIAGNOSIS — Z9861 Coronary angioplasty status: Secondary | ICD-10-CM

## 2014-04-09 HISTORY — PX: PERMANENT PACEMAKER INSERTION: SHX5480

## 2014-04-09 LAB — PROTIME-INR
INR: 1.1 (ref 0.00–1.49)
INR: 1.05 (ref 0.00–1.49)
PROTHROMBIN TIME: 13.8 s (ref 11.6–15.2)
Prothrombin Time: 14.3 seconds (ref 11.6–15.2)

## 2014-04-09 LAB — CBC WITH DIFFERENTIAL/PLATELET
BASOS PCT: 0 % (ref 0–1)
Basophils Absolute: 0 10*3/uL (ref 0.0–0.1)
EOS ABS: 0.1 10*3/uL (ref 0.0–0.7)
EOS PCT: 1 % (ref 0–5)
HEMATOCRIT: 42.4 % (ref 39.0–52.0)
HEMOGLOBIN: 14 g/dL (ref 13.0–17.0)
Lymphocytes Relative: 13 % (ref 12–46)
Lymphs Abs: 1.9 10*3/uL (ref 0.7–4.0)
MCH: 34.1 pg — AB (ref 26.0–34.0)
MCHC: 33 g/dL (ref 30.0–36.0)
MCV: 103.2 fL — ABNORMAL HIGH (ref 78.0–100.0)
MONO ABS: 1.1 10*3/uL — AB (ref 0.1–1.0)
MONOS PCT: 8 % (ref 3–12)
NEUTROS PCT: 78 % — AB (ref 43–77)
Neutro Abs: 11.3 10*3/uL — ABNORMAL HIGH (ref 1.7–7.7)
Platelets: 244 10*3/uL (ref 150–400)
RBC: 4.11 MIL/uL — AB (ref 4.22–5.81)
RDW: 13.5 % (ref 11.5–15.5)
WBC: 14.4 10*3/uL — AB (ref 4.0–10.5)

## 2014-04-09 LAB — CBC
HCT: 41.8 % (ref 39.0–52.0)
Hemoglobin: 14 g/dL (ref 13.0–17.0)
MCH: 34.7 pg — ABNORMAL HIGH (ref 26.0–34.0)
MCHC: 33.5 g/dL (ref 30.0–36.0)
MCV: 103.7 fL — ABNORMAL HIGH (ref 78.0–100.0)
PLATELETS: 204 10*3/uL (ref 150–400)
RBC: 4.03 MIL/uL — ABNORMAL LOW (ref 4.22–5.81)
RDW: 13.5 % (ref 11.5–15.5)
WBC: 16.4 10*3/uL — AB (ref 4.0–10.5)

## 2014-04-09 LAB — PHOSPHORUS: Phosphorus: 3.6 mg/dL (ref 2.3–4.6)

## 2014-04-09 LAB — MAGNESIUM: Magnesium: 1.8 mg/dL (ref 1.5–2.5)

## 2014-04-09 LAB — COMPREHENSIVE METABOLIC PANEL
ALT: 21 U/L (ref 0–53)
AST: 30 U/L (ref 0–37)
Albumin: 3.9 g/dL (ref 3.5–5.2)
Alkaline Phosphatase: 87 U/L (ref 39–117)
Anion gap: 11 (ref 5–15)
BUN: 16 mg/dL (ref 6–23)
CO2: 22 mmol/L (ref 19–32)
Calcium: 8.9 mg/dL (ref 8.4–10.5)
Chloride: 103 mmol/L (ref 96–112)
Creatinine, Ser: 1.37 mg/dL — ABNORMAL HIGH (ref 0.50–1.35)
GFR calc Af Amer: 56 mL/min — ABNORMAL LOW (ref >90)
GFR calc non Af Amer: 48 mL/min — ABNORMAL LOW (ref >90)
Glucose, Bld: 153 mg/dL — ABNORMAL HIGH (ref 70–99)
Potassium: 5 mmol/L (ref 3.5–5.1)
SODIUM: 136 mmol/L (ref 135–145)
Total Bilirubin: 0.8 mg/dL (ref 0.3–1.2)
Total Protein: 7.3 g/dL (ref 6.0–8.3)

## 2014-04-09 LAB — CREATININE, SERUM
Creatinine, Ser: 1.58 mg/dL — ABNORMAL HIGH (ref 0.50–1.35)
GFR calc non Af Amer: 41 mL/min — ABNORMAL LOW (ref >90)
GFR, EST AFRICAN AMERICAN: 47 mL/min — AB (ref >90)

## 2014-04-09 LAB — TROPONIN I
TROPONIN I: 0.07 ng/mL — AB (ref <0.031)
TROPONIN I: 0.21 ng/mL — AB (ref <0.031)
Troponin I: <0.03 ng/mL (ref <0.031)

## 2014-04-09 LAB — TSH: TSH: 2.833 u[IU]/mL (ref 0.350–4.500)

## 2014-04-09 LAB — MRSA PCR SCREENING: MRSA by PCR: NEGATIVE

## 2014-04-09 LAB — APTT: APTT: 36 s (ref 24–37)

## 2014-04-09 SURGERY — PERMANENT PACEMAKER INSERTION

## 2014-04-09 MED ORDER — ROSUVASTATIN CALCIUM 10 MG PO TABS
20.0000 mg | ORAL_TABLET | Freq: Every day | ORAL | Status: DC
  Administered 2014-04-09 – 2014-04-11 (×3): 20 mg via ORAL
  Filled 2014-04-09: qty 1
  Filled 2014-04-09: qty 2
  Filled 2014-04-09: qty 1

## 2014-04-09 MED ORDER — NITROGLYCERIN 0.4 MG SL SUBL
0.4000 mg | SUBLINGUAL_TABLET | SUBLINGUAL | Status: DC | PRN
Start: 2014-04-09 — End: 2014-04-11

## 2014-04-09 MED ORDER — ASPIRIN BUFFERED 325 MG PO TABS: 325.0000 mg | ORAL_TABLET | Freq: Every day | ORAL | Status: DC

## 2014-04-09 MED ORDER — NITROGLYCERIN 0.4 MG SL SUBL: 0.4000 mg | SUBLINGUAL_TABLET | SUBLINGUAL | Status: DC | PRN

## 2014-04-09 MED ORDER — CHLORHEXIDINE GLUCONATE 4 % EX LIQD
60.0000 mL | Freq: Once | CUTANEOUS | Status: AC
  Administered 2014-04-09: 4 via TOPICAL
  Filled 2014-04-09: qty 60

## 2014-04-09 MED ORDER — ASPIRIN EC 81 MG PO TBEC
81.0000 mg | DELAYED_RELEASE_TABLET | Freq: Every day | ORAL | Status: DC
  Administered 2014-04-10 – 2014-04-11 (×2): 81 mg via ORAL
  Filled 2014-04-09 (×2): qty 1

## 2014-04-09 MED ORDER — SODIUM CHLORIDE 0.9 % IV SOLN
250.0000 mL | INTRAVENOUS | Status: DC | PRN
  Administered 2014-04-10: 250 mL via INTRAVENOUS

## 2014-04-09 MED ORDER — SODIUM CHLORIDE 0.9 % IJ SOLN: 3.0000 mL | INTRAMUSCULAR | Status: DC | PRN

## 2014-04-09 MED ORDER — CHLORHEXIDINE GLUCONATE 4 % EX LIQD
60.0000 mL | Freq: Once | CUTANEOUS | Status: AC
  Administered 2014-04-10: 4 via TOPICAL

## 2014-04-09 MED ORDER — HEPARIN SODIUM (PORCINE) 5000 UNIT/ML IJ SOLN: 5000.0000 [IU] | Freq: Three times a day (TID) | INTRAMUSCULAR | Status: DC

## 2014-04-09 MED ORDER — ASPIRIN EC 325 MG PO TBEC
325.0000 mg | DELAYED_RELEASE_TABLET | Freq: Every day | ORAL | Status: DC
  Filled 2014-04-09: qty 1

## 2014-04-09 MED ORDER — SODIUM CHLORIDE 0.9 % IR SOLN
80.0000 mg | Status: DC
  Filled 2014-04-09 (×3): qty 2

## 2014-04-09 MED ORDER — ONDANSETRON HCL 4 MG/2ML IJ SOLN
INTRAMUSCULAR | Status: AC
  Administered 2014-04-09: 4 mg
  Filled 2014-04-09: qty 2

## 2014-04-09 MED ORDER — ALBUTEROL SULFATE (2.5 MG/3ML) 0.083% IN NEBU: 2.5000 mg | INHALATION_SOLUTION | Freq: Four times a day (QID) | RESPIRATORY_TRACT | Status: DC | PRN

## 2014-04-09 MED ORDER — ONDANSETRON HCL 4 MG/2ML IJ SOLN: 4.0000 mg | Freq: Four times a day (QID) | INTRAMUSCULAR | Status: DC | PRN

## 2014-04-09 MED ORDER — SODIUM CHLORIDE 0.9 % IJ SOLN
3.0000 mL | Freq: Two times a day (BID) | INTRAMUSCULAR | Status: DC
  Administered 2014-04-09 – 2014-04-10 (×2): 3 mL via INTRAVENOUS

## 2014-04-09 MED ORDER — LIDOCAINE HCL (PF) 1 % IJ SOLN
INTRAMUSCULAR | Status: AC
  Filled 2014-04-09: qty 30

## 2014-04-09 MED ORDER — ACETAMINOPHEN 325 MG PO TABS: 650.0000 mg | ORAL_TABLET | ORAL | Status: DC | PRN

## 2014-04-09 MED ORDER — LISINOPRIL 2.5 MG PO TABS
2.5000 mg | ORAL_TABLET | Freq: Every day | ORAL | Status: DC
  Administered 2014-04-09 – 2014-04-11 (×2): 2.5 mg via ORAL
  Filled 2014-04-09 (×3): qty 1

## 2014-04-09 MED ORDER — TIOTROPIUM BROMIDE MONOHYDRATE 18 MCG IN CAPS
18.0000 ug | ORAL_CAPSULE | Freq: Every day | RESPIRATORY_TRACT | Status: DC
Start: 2014-04-09 — End: 2014-04-11
  Administered 2014-04-10: 18 ug via RESPIRATORY_TRACT
  Filled 2014-04-09 (×2): qty 5

## 2014-04-09 MED ORDER — VANCOMYCIN HCL IN DEXTROSE 1-5 GM/200ML-% IV SOLN
1000.0000 mg | INTRAVENOUS | Status: DC
  Filled 2014-04-09 (×3): qty 200

## 2014-04-09 NOTE — CV Procedure (Signed)
   Name: Patrick Sampson MRN: [id number] DOB: September 08, 1934  Procedure: Temporary Transvenous Pacemaker insertion  Indication: 80 yo WM with syncope and complete heart block with ventricular escape of 30 bpm and hypotension.  Procedure description:  The right groin was prepped and draped in a sterile fashion. Local anesthesia was provided with 1% Lidocaine. Using a modified Seldinger technique a 6 French sheath was inserted into the right femoral artery. When this was confirmed a second 6 Fr sheath was placed in the right femoral vein. Under fluoroscopic guidance a balloon tip pacing wire was advanced via the sheath into the right ventricular apex. It was difficult to position the pacing wire and obtain an adequate threshold without causing a lot of ventricular ectopy. After manipulation and repositioning a good position was obtained.  Adequate Pacemaker capture and thresholds were documented. The sheath was sutured in place. The patient was transferred to the ICU for further monitoring. There were no complications.   Impression: Successful placement of temporary transvenous pacemaker.  Peter Martinique MD, [facility]    3/21/2016NOW

## 2014-04-09 NOTE — H&P (Signed)
Patient ID: Patrick Sampson MRN: [id number], DOB/AGE: 1934-07-13   Admit date: 04/09/2014   Primary Physician: Owens Loffler, MD Primary Cardiologist: Dr. Stanford Breed  Pt. Profile:  80 year old gentleman with past history of ischemic heart disease admitted with complete heart block.  Problem List  Past Medical History  Diagnosis Date  . Prostate cancer 2000  . Myocardial infarction 11/2008  . Allergic rhinitis due to pollen   . Arthritis   . Cataract   . Hyperlipidemia   . Raynauds disease   . Tinnitus of both ears   . COPD with emphysema   . CAD (coronary artery disease) 11/2008    a. Inf STEMI 11/2008 => Mid to dist LAD 70-75%, mid CFX occluded, oRCA 50%, mRCA 50%, inf AK, EF 50% => PCI: overlapping BMSs to CFX;  b.  F/u LHC 01/2009:  mid LAD 50%, mid CFX stents patent. There was 50-70% stenosis at the prox edge of the stent; IVUS of the CFX and LAD with no hemodynamically significant stenoses=>Med Rx.;  c.  ETT-Myoview 8/14:  Inf and IL scar, no ischemia, low risk  . AAA (abdominal aortic aneurysm)     a. Abdominal US 01/2011:  2.4 x 2.4 cm. Follow up recommended in 2 years.;  b. Abdominal US (01/2013):  Infrarenal AAA 2.4 x 2.4 cm  . Cancer of lung, upper lobe, Right, in remission 2000    Past Surgical History  Procedure Laterality Date  . Angioplasty / stenting femoral    . Lung lobectomy Right 2000    Right upper lobectomy  . Transperineal implant of radiation seeds w/ ultrasound  2000    for cancer,rad.with seed plantation  . Polypectomy    . Colonoscopy  2007     Allergies  Allergies  Allergen Reactions  . Amoxicillin Rash    HPI  This 80 year old gentleman resents to the emergency room after having syncope at home and is in complete heart block.  He has not taken any of his heart medications today.  He has a past history of ischemic heart disease and last saw Dr.  Stanford Breed on 03/08/14, for follow-up of his coronary disease, hypertension, hyperlipidemia, and  abdominal aortic aneurysm.He presented to the hospital in 11/2008 with an inferior STEMI. LHC 11/2008: Mid to distal LAD 70-75%, mid CFX occluded, ostial RCA 50%, mid RCA 50%, inferior AK, EF 50%. He underwent overlapping bare-metal stents to the CFX at that time. Follow up [facility] 01/2009 demonstrated mid LAD 50%, mid CFX stents patent. There was 50-70% stenosis at the proximal edge of the stent. IVUS of the CFX and LAD demonstrated no hemodynamically significant stenoses. Continued medical therapy was recommended. Nuclear study 8/14 showed inferior scar and no ischemia. Abdominal US 1/15: 2.4 x 2.4 cm. Follow up recommended in 2 years. Since last seen, the patient denies any dyspnea on exertion, orthopnea, PND, pedal edema, palpitations, syncope or chest pain. Last night the patient went to bed feeling well.  He got up in the middle of the night to go to the bathroom to urinate which is customary for him.  He had syncope and woke up on the floor of the bathroom.  He went back to bed.  He got up a second time later to go to the bathroom and was dizzy but did not have syncope.  While preparing breakfast he had another episode of syncope or near-syncope.  He has a bruise on the left side of his head from his initial fall and is getting  a CT scan. The patient denies any recent history of angina pectoris.  He exercises regularly, playing golf etc..  Home Medications  Prior to Admission medications   Medication Sig Start Date End Date Taking? Authorizing Provider  albuterol (PROVENTIL HFA;VENTOLIN HFA) 108 (90 BASE) MCG/ACT inhaler Inhale 2 puffs into the lungs every 6 (six) hours as needed for wheezing. 04/26/13   Owens Loffler, MD  aspirin 325 MG buffered tablet Take 325 mg by mouth daily.      Historical Provider, MD  Fluticasone-Salmeterol (ADVAIR) 250-50 MCG/DOSE AEPB Inhale 1 puff into the lungs 2 (two) times daily. 05/25/13   Amy Cletis [place], MD  lisinopril (PRINIVIL,ZESTRIL) 2.5 MG tablet TAKE ONE TABLET BY  MOUTH ONCE DAILY 11/07/13   Lelon Perla, MD  Lutein 10 MG TABS Take 10 mg by mouth 2 (two) times daily.      Historical Provider, MD  metoprolol tartrate (LOPRESSOR) 25 MG tablet TAKE ONE TABLET BY MOUTH TWICE DAILY 10/11/13   Lelon Perla, MD  nitroGLYCERIN (NITROSTAT) 0.4 MG SL tablet Place 1 tablet (0.4 mg total) under the tongue every 5 (five) minutes as needed for chest pain. 08/03/12   Lelon Perla, MD  rosuvastatin (CRESTOR) 40 MG tablet Take 0.5 tablets (20 mg total) by mouth daily. 01/15/14   Lelon Perla, MD  tiotropium (SPIRIVA HANDIHALER) 18 MCG inhalation capsule Place 1 capsule (18 mcg total) into inhaler and inhale daily. 05/25/13   Amy Cletis [place], MD    Family History  Family History  Problem Relation Age of Onset  . Colon cancer Sister 12    rectal cancer  . Lung cancer Father 85    smoker and coal mining  . Congestive Heart Failure Mother   . CAD Brother   . Tuberculosis Brother   . Kidney failure Brother     ESRD on dialysis  . Coronary artery disease Brother     s/p CABG    Social History  History   Social History  . Marital Status: Married    Spouse Name: N/A  . Number of Children: N/A  . Years of Education: N/A   Occupational History  . Not on file.   Social History Main Topics  . Smoking status: Former Smoker -- 50 years    Types: Cigarettes    Quit date: 01/20/2007  . Smokeless tobacco: Never Used  . Alcohol Use: 6.0 oz/week    10 Cans of beer per week     Comment: moderate  . Drug Use: No  . Sexual Activity: Not on file   Other Topics Concern  . Not on file   Social History Narrative     Review of Systems General:  No chills, fever, night sweats or weight changes.  Cardiovascular:  No chest pain, dyspnea on exertion, edema, orthopnea, palpitations, paroxysmal nocturnal dyspnea. Dermatological: No rash, lesions/masses Respiratory: No cough, dyspnea Urologic: No hematuria, dysuria Abdominal:   No nausea, vomiting,  diarrhea, bright red blood per rectum, melena, or hematemesis Neurologic:  No visual changes, wkns, changes in mental status. All other systems reviewed and are otherwise negative except as noted above.  Physical Exam  Blood pressure 90/40, pulse 34, temperature 96.5 F (35.8 C), temperature source Axillary, resp. rate 18, height 6' (1.829 m), weight 160 lb (72.576 kg), SpO2 99 %.  General: Pleasant, NAD Psych: Normal affect. Neuro: Alert and oriented X 3. Moves all extremities spontaneously. HEENT: There is a bruise and area of ecchymosis over the left  eyebrow area and left temporal area  Neck: Supple without bruits or JVD. Lungs:  Resp regular and unlabored, CTA. Heart: RRR no s3, s4, or murmurs. Abdomen: Soft, non-tender, non-distended, BS + x 4.  Extremities: No clubbing, cyanosis or edema. DP/PT/Radials 2+ and equal bilaterally.  The patient has Raynaud's phenomenon and his fingers and toes are chronically dusky.  Labs  Troponin (Point of Care Test) No results for input(s): TROPIPOC in the last 72 hours. No results for input(s): CKTOTAL, CKMB, TROPONINI in the last 72 hours. Lab Results  Component Value Date   WBC 10.1 02/22/2013   HGB 14.0 02/22/2013   HCT 42.3 02/22/2013   MCV 101.5* 02/22/2013   PLT 334.0 02/22/2013   No results for input(s): NA, K, CL, CO2, BUN, CREATININE, CALCIUM, PROT, BILITOT, ALKPHOS, ALT, AST, GLUCOSE in the last 168 hours.  Invalid input(s): LABALBU Lab Results  Component Value Date   CHOL 118 11/22/2013   HDL 45 11/22/2013   LDLCALC 55 11/22/2013   TRIG 91 11/22/2013   No results found for: DDIMER   Radiology/Studies  No results found.  ECG 09-Apr-2014 11:37:11 [facility]/ED ROUTINE RECORD AV block, complete (third degree) Ventricular escape rhythm. Personally reviewed.  ASSESSMENT AND PLAN  1.  Syncope secondary to new onset of complete heart block 2.  Ischemic heart disease status post inferior wall STEMI  secondary to total occlusion of the circumflex at that time with treatment with overlapping bare metal stents.  No recent angina pectoris.  Last nuclear study 8/14 showed inferior scar and no ischemia 3.  Known small abdominal aortic aneurysm 4.  Essential hypertension 5.  Hypercholesterolemia  Plan: The patient is being cleared by the emergency room staff in regard to his head trauma with a CT scan, following which he will be transferred to the cardiac catheterization lab for a temporary pacemaker.  We will check thyroid function and obtain serial cardiac enzymes.Berna Spare, MD  04/09/2014, 11:52 AM

## 2014-04-09 NOTE — ED Provider Notes (Addendum)
CSN: [id number]     Arrival date & time 04/09/14  1132 History   First MD Initiated Contact with Patient 04/09/14 1145     Chief Complaint  Patient presents with  . Loss of Consciousness     HPI Patient presents to the emergency department in complete heart block with heart rates in the 30s.  He states that he got up to the use the restroom in the middle the night and the next thing he knew he was on the ground.  He stated he was hurting in his left forehead after some trauma and presents with a black eye around his left eye.  He was able to get himself back in bed and then this morning had some nausea and vomiting and generalized weakness.  EMS was called.  Patient was found to be hypotensive bradycardic for EMS.  The patient was given 500 cc of normal saline.  He was given Zofran for his nausea.  He denies chest pain or shortness breath at this time.  He denies significant headache.  He is not on any anticoagulants.  He does have a history of coronary artery disease status post PCI.  His primary cardiologist is Dr. Stanford Breed.  Past Medical History  Diagnosis Date  . Prostate cancer 2000  . Myocardial infarction 11/2008  . Allergic rhinitis due to pollen   . Arthritis   . Cataract   . Hyperlipidemia   . Raynauds disease   . Tinnitus of both ears   . COPD with emphysema   . CAD (coronary artery disease) 11/2008    a. Inf STEMI 11/2008 => Mid to dist LAD 70-75%, mid CFX occluded, oRCA 50%, mRCA 50%, inf AK, EF 50% => PCI: overlapping BMSs to CFX;  b.  F/u LHC 01/2009:  mid LAD 50%, mid CFX stents patent. There was 50-70% stenosis at the prox edge of the stent; IVUS of the CFX and LAD with no hemodynamically significant stenoses=>Med Rx.;  c.  ETT-Myoview 8/14:  Inf and IL scar, no ischemia, low risk  . AAA (abdominal aortic aneurysm)     a. Abdominal US 01/2011:  2.4 x 2.4 cm. Follow up recommended in 2 years.;  b. Abdominal US (01/2013):  Infrarenal AAA 2.4 x 2.4 cm  . Cancer of lung, upper  lobe, Right, in remission 2000   Past Surgical History  Procedure Laterality Date  . Angioplasty / stenting femoral    . Lung lobectomy Right 2000    Right upper lobectomy  . Transperineal implant of radiation seeds w/ ultrasound  2000    for cancer,rad.with seed plantation  . Polypectomy    . Colonoscopy  2007   Family History  Problem Relation Age of Onset  . Colon cancer Sister 50    rectal cancer  . Lung cancer Father 9    smoker and coal mining  . Congestive Heart Failure Mother   . CAD Brother   . Tuberculosis Brother   . Kidney failure Brother     ESRD on dialysis  . Coronary artery disease Brother     s/p CABG   History  Substance Use Topics  . Smoking status: Former Smoker -- 50 years    Types: Cigarettes    Quit date: 01/20/2007  . Smokeless tobacco: Never Used  . Alcohol Use: 6.0 oz/week    10 Cans of beer per week     Comment: moderate    Review of Systems  All other systems reviewed and are negative.  Allergies  Amoxicillin  Home Medications   Prior to Admission medications   Medication Sig Start Date End Date Taking? Authorizing Provider  albuterol (PROVENTIL HFA;VENTOLIN HFA) 108 (90 BASE) MCG/ACT inhaler Inhale 2 puffs into the lungs every 6 (six) hours as needed for wheezing. 04/26/13   Owens Loffler, MD  aspirin 325 MG buffered tablet Take 325 mg by mouth daily.      Historical Provider, MD  Fluticasone-Salmeterol (ADVAIR) 250-50 MCG/DOSE AEPB Inhale 1 puff into the lungs 2 (two) times daily. 05/25/13   Amy Cletis [place], MD  lisinopril (PRINIVIL,ZESTRIL) 2.5 MG tablet TAKE ONE TABLET BY MOUTH ONCE DAILY 11/07/13   Lelon Perla, MD  Lutein 10 MG TABS Take 10 mg by mouth 2 (two) times daily.      Historical Provider, MD  metoprolol tartrate (LOPRESSOR) 25 MG tablet TAKE ONE TABLET BY MOUTH TWICE DAILY 10/11/13   Lelon Perla, MD  nitroGLYCERIN (NITROSTAT) 0.4 MG SL tablet Place 1 tablet (0.4 mg total) under the tongue every 5 (five)  minutes as needed for chest pain. 08/03/12   Lelon Perla, MD  rosuvastatin (CRESTOR) 40 MG tablet Take 0.5 tablets (20 mg total) by mouth daily. 01/15/14   Lelon Perla, MD  tiotropium (SPIRIVA HANDIHALER) 18 MCG inhalation capsule Place 1 capsule (18 mcg total) into inhaler and inhale daily. 05/25/13   Amy E Bedsole, MD   BP 90/40 mmHg  Pulse 34  Temp(Src) 96.5 F (35.8 C) (Axillary)  Resp 18  Ht 6' (1.829 m)  Wt 160 lb (72.576 kg)  BMI 21.70 kg/m2  SpO2 99% Physical Exam  Constitutional: He is oriented to person, place, and time. He appears well-developed and well-nourished.  HENT:  Head: Normocephalic.  Bruising and periorbital edema and swelling around his left eye. Movements are intact.  No trismus or malocclusion.  Dentition is normal.  Eyes: EOM are normal. Pupils are equal, round, and reactive to light.  Neck: Neck supple.  C-spine immobilized in cervical collar.:  Cardiovascular:  Bradycardic.  Irregularly irregular  Pulmonary/Chest: Effort normal and breath sounds normal. No respiratory distress.  Abdominal: Soft. He exhibits no distension. There is no tenderness.  Musculoskeletal: Normal range of motion.  Neurological: He is alert and oriented to person, place, and time.  5/5 strength in major muscle groups of  bilateral upper and lower extremities. Speech normal. No facial asymetry.   Skin: Skin is warm and dry.  Psychiatric: He has a normal mood and affect. Judgment normal.  Nursing note and vitals reviewed.   ED Course  Procedures (including critical care time)  CRITICAL CARE Performed by: Hoy Morn Total critical care time: 30 Critical care time was exclusive of separately billable procedures and treating other patients. Critical care was necessary to treat or prevent imminent or life-threatening deterioration. Critical care was time spent personally by me on the following activities: development of treatment plan with patient and/or surrogate as  well as nursing, discussions with consultants, evaluation of patient's response to treatment, examination of patient, obtaining history from patient or surrogate, ordering and performing treatments and interventions, ordering and review of laboratory studies, ordering and review of radiographic studies, pulse oximetry and re-evaluation of patient's condition.    Labs Review Labs Reviewed  CBC WITH DIFFERENTIAL/PLATELET - Abnormal; Notable for the following:    WBC 14.4 (*)    RBC 4.11 (*)    MCV 103.2 (*)    MCH 34.1 (*)    Neutrophils Relative % 78 (*)  Neutro Abs 11.3 (*)    Monocytes Absolute 1.1 (*)    All other components within normal limits  COMPREHENSIVE METABOLIC PANEL - Abnormal; Notable for the following:    Glucose, Bld 153 (*)    Creatinine, Ser 1.37 (*)    GFR calc non Af Amer 48 (*)    GFR calc Af Amer 56 (*)    All other components within normal limits  TROPONIN I - Abnormal; Notable for the following:    Troponin I 0.07 (*)    All other components within normal limits  CBC - Abnormal; Notable for the following:    WBC 16.4 (*)    RBC 4.03 (*)    MCV 103.7 (*)    MCH 34.7 (*)    All other components within normal limits  CREATININE, SERUM - Abnormal; Notable for the following:    Creatinine, Ser 1.58 (*)    GFR calc non Af Amer 41 (*)    GFR calc Af Amer 47 (*)    All other components within normal limits  MRSA PCR SCREENING  TROPONIN I  MAGNESIUM  PHOSPHORUS  PROTIME-INR  PROTIME-INR  APTT  TSH  TROPONIN I  TROPONIN I    Imaging Review Ct Head Wo Contrast  04/09/2014   CLINICAL DATA:  Syncopal episode. Patient went to bathroom and woke up on the floor, now with hypotension and bradycardia. Bruising under the left eye. History of prostate and lung cancer.  EXAM: CT HEAD WITHOUT CONTRAST  CT CERVICAL SPINE WITHOUT CONTRAST  TECHNIQUE: Multidetector CT imaging of the head and cervical spine was performed following the standard protocol without  intravenous contrast. Multiplanar CT image reconstructions of the cervical spine were also generated.  COMPARISON:  None.  FINDINGS: CT HEAD FINDINGS  There is mild soft tissue swelling about the superior lateral aspect of the left orbit (representative image 7, series 2). This finding is without associated radiopaque foreign body or displaced calvarial fracture.  Rather extensive periventricular hypodensities compatible of microvascular ischemic disease. Bilateral basal ganglial calcifications. The gray-white differentiation is otherwise well maintained without CT evidence of acute large territory infarct. No intraparenchymal or extra-axial mass or hemorrhage. Normal size and configuration of the ventricles and basilar cisterns. No midline shift. Intracranial atherosclerosis. Scattered opacification of the left anterior and the right anterior and posterior ethmoidal air cells. Minimal polypoid mucosal thickening within the right maxillary sinus. The remaining paranasal sinuses and mastoid air cells are normally aerated. No air-fluid levels.  CT CERVICAL SPINE FINDINGS  C1 to the superior endplate of T4 is imaged.  Normal alignment of the cervical spine. No anterolisthesis or retrolisthesis. The bilateral facets are normally aligned. The dens is normally positioned between the lateral masses of C1. Mild atlantodental degenerative change. Normal atlantoaxial articulations.  No fracture or static subluxation of the cervical spine. Cervical vertebral body heights are preserved.  Mild to moderate multilevel cervical spine DDD, worse at C5-C6 and C6-C7 with disc space height loss, endplate irregularity and small posteriorly directed disc osteophyte complexes at these locations. There is partial ossification of the C3-C4 intervertebral disc space including complete fusion of the left C3-C4 transverse process ease.  Regional soft tissues appear normal. There is a minimal amount of air within the venous system of the neck,  likely the sequela of recent intravenous access acquisition. Atherosclerotic plaque within the bilateral carotid bulbs. Normal noncontrast appearance of the thyroid gland.  Limited visualization of lung apices demonstrates postsurgical change of the left lung apex with  mild centrilobular emphysematous change.  IMPRESSION: 1. Mild soft tissue swelling about the superior lateral aspect the left orbit without associated radiopaque foreign body, displaced calvarial fracture or acute intracranial process. 2. Advanced microvascular ischemic disease. 3. No fracture or static subluxation of the cervical spine. 4. Mild to moderate multilevel cervical spine DDD, worse C5-C6 and C6-C7. Above findings discussed with Dr. Venora Maples at 12/2012.   Electronically Signed   By: Sandi Mariscal M.D.   On: 04/09/2014 12:21   Ct Cervical Spine Wo Contrast  04/09/2014   CLINICAL DATA:  Syncopal episode. Patient went to bathroom and woke up on the floor, now with hypotension and bradycardia. Bruising under the left eye. History of prostate and lung cancer.  EXAM: CT HEAD WITHOUT CONTRAST  CT CERVICAL SPINE WITHOUT CONTRAST  TECHNIQUE: Multidetector CT imaging of the head and cervical spine was performed following the standard protocol without intravenous contrast. Multiplanar CT image reconstructions of the cervical spine were also generated.  COMPARISON:  None.  FINDINGS: CT HEAD FINDINGS  There is mild soft tissue swelling about the superior lateral aspect of the left orbit (representative image 7, series 2). This finding is without associated radiopaque foreign body or displaced calvarial fracture.  Rather extensive periventricular hypodensities compatible of microvascular ischemic disease. Bilateral basal ganglial calcifications. The gray-white differentiation is otherwise well maintained without CT evidence of acute large territory infarct. No intraparenchymal or extra-axial mass or hemorrhage. Normal size and configuration of the  ventricles and basilar cisterns. No midline shift. Intracranial atherosclerosis. Scattered opacification of the left anterior and the right anterior and posterior ethmoidal air cells. Minimal polypoid mucosal thickening within the right maxillary sinus. The remaining paranasal sinuses and mastoid air cells are normally aerated. No air-fluid levels.  CT CERVICAL SPINE FINDINGS  C1 to the superior endplate of T4 is imaged.  Normal alignment of the cervical spine. No anterolisthesis or retrolisthesis. The bilateral facets are normally aligned. The dens is normally positioned between the lateral masses of C1. Mild atlantodental degenerative change. Normal atlantoaxial articulations.  No fracture or static subluxation of the cervical spine. Cervical vertebral body heights are preserved.  Mild to moderate multilevel cervical spine DDD, worse at C5-C6 and C6-C7 with disc space height loss, endplate irregularity and small posteriorly directed disc osteophyte complexes at these locations. There is partial ossification of the C3-C4 intervertebral disc space including complete fusion of the left C3-C4 transverse process ease.  Regional soft tissues appear normal. There is a minimal amount of air within the venous system of the neck, likely the sequela of recent intravenous access acquisition. Atherosclerotic plaque within the bilateral carotid bulbs. Normal noncontrast appearance of the thyroid gland.  Limited visualization of lung apices demonstrates postsurgical change of the left lung apex with mild centrilobular emphysematous change.  IMPRESSION: 1. Mild soft tissue swelling about the superior lateral aspect the left orbit without associated radiopaque foreign body, displaced calvarial fracture or acute intracranial process. 2. Advanced microvascular ischemic disease. 3. No fracture or static subluxation of the cervical spine. 4. Mild to moderate multilevel cervical spine DDD, worse C5-C6 and C6-C7. Above findings  discussed with Dr. Venora Maples at 12/2012.   Electronically Signed   By: Sandi Mariscal M.D.   On: 04/09/2014 12:21   Dg Chest Portable 1 View  04/09/2014   CLINICAL DATA:  Syncope with difficulty breathing  EXAM: PORTABLE CHEST - 1 VIEW  COMPARISON:  Chest radiograph February 24, 2012; chest CT August 22, 2013  FINDINGS: There is underlying emphysematous change.  There is mild left base atelectasis. There is no edema or consolidation. Heart is slightly enlarged. Pulmonary vascularity reflects underlying emphysema. There is postoperative change overlying the left hilum. There is an old healed fracture of the right clavicle. No adenopathy.  IMPRESSION: Underlying emphysema. Mild atelectasis left base. No edema or consolidation. Heart slightly enlarged but stable.   Electronically Signed   By: Lowella Grip III M.D.   On: 04/09/2014 12:29    ECG interpretation  Date: 04/09/2014  Rate: 30  Rhythm: complete heart block  QRS Axis: normal  Intervals: normal  ST/T Wave abnormalities: nonspecific  Conduction Disutrbances: none  Narrative Interpretation:   Old EKG Reviewed: changed from prior ecg     MDM   Final diagnoses:  Syncope  Fall  Neck pain  Complete heart block    Patient with new onset complete heart block as a cause of his syncope.  His blood pressure soft at 90/40.  He is currently mentating normally.  We will clear him from a trauma standpoint with a CT head and CT C-spine.  As long as all that is okay the plan will be to send him up to catheter lab for temporary pacemaker.  Cardiology, Dr. Mare Ferrari, has seen and evaluated the patient.    Jola Schmidt, MD 04/09/14 [place], MD 04/09/14 [place], MD 04/09/14 [phone]

## 2014-04-09 NOTE — ED Notes (Signed)
Pt here for syncope, woke up to go to restroom and remembers waking up on floor. With ems, hypotensive and brady cardic with is not normal for pt. Pt has not taken htn meds today. Bruising to left eye. Ems gave 500 ml ns bolus and 4 mg zofran.

## 2014-04-09 NOTE — Consult Note (Signed)
Reason for Consult:   CHB and syncope  Requesting Physician: Dr Mare Ferrari  HPI:  This is a 80 y.o. male, followed by Dr Stanford Breed,  with a past medical history significant for CAD, s/p CFX BMS in 2010. Follow up cath in 2011 looked OK and Myoview in 2014 was low risk with inferior and inferior lateral scar but no ischemia. LVF was not gated. He last saw Dr Stanford Breed 03/09/14. No changes were made and the pt was doing well.             The pt presented to the ED this am after a syncopal spell.  Last night the patient went to bed feeling well. He got up in the middle of the night to go to the bathroom to urinate which is customary for him. He had syncope and woke up on the floor of the bathroom. He went back to bed. He got up a second time later to go to the bathroom and was dizzy but did not have syncope. While preparing breakfast he had another episode of syncope or near-syncope. He has a bruise on the left side of his head from his initial fall. Ct was negative for ICH. He was taken to the cath lab an had a T-TVDP placed.              PMHx:  Past Medical History  Diagnosis Date  . Prostate cancer 2000  . Myocardial infarction 11/2008  . Allergic rhinitis due to pollen   . Arthritis   . Cataract   . Hyperlipidemia   . Raynauds disease   . Tinnitus of both ears   . COPD with emphysema   . CAD (coronary artery disease) 11/2008    a. Inf STEMI 11/2008 => Mid to dist LAD 70-75%, mid CFX occluded, oRCA 50%, mRCA 50%, inf AK, EF 50% => PCI: overlapping BMSs to CFX;  b.  F/u LHC 01/2009:  mid LAD 50%, mid CFX stents patent. There was 50-70% stenosis at the prox edge of the stent; IVUS of the CFX and LAD with no hemodynamically significant stenoses=>Med Rx.;  c.  ETT-Myoview 8/14:  Inf and IL scar, no ischemia, low risk  . AAA (abdominal aortic aneurysm)     a. Abdominal US 01/2011:  2.4 x 2.4 cm. Follow up recommended in 2 years.;  b. Abdominal US (01/2013):  Infrarenal AAA 2.4 x  2.4 cm  . Cancer of lung, upper lobe, Right, in remission 2000    Past Surgical History  Procedure Laterality Date  . Angioplasty / stenting femoral    . Lung lobectomy Right 2000    Right upper lobectomy  . Transperineal implant of radiation seeds w/ ultrasound  2000    for cancer,rad.with seed plantation  . Polypectomy    . Colonoscopy  2007    SOCHx:  reports that he quit smoking about 7 years ago. His smoking use included Cigarettes. He quit after 50 years of use. He has never used smokeless tobacco. He reports that he drinks about 6.0 oz of alcohol per week. He reports that he does not use illicit drugs.  FAMHx: Family History  Problem Relation Age of Onset  . Colon cancer Sister 28    rectal cancer  . Lung cancer Father 44    smoker and coal mining  . Congestive Heart Failure Mother   . CAD Brother   . Tuberculosis Brother   . Kidney failure Brother  ESRD on dialysis  . Coronary artery disease Brother     s/p CABG    ALLERGIES: Allergies  Allergen Reactions  . Amoxicillin Rash    ROS: Pertinent items are noted in HPI. See H&P for complete ROS He denies any unusual DOE or previous syncope or pre syncope   HOME MEDICATIONS: Prior to Admission medications   Medication Sig Start Date End Date Taking? Authorizing Provider  albuterol (PROVENTIL HFA;VENTOLIN HFA) 108 (90 BASE) MCG/ACT inhaler Inhale 2 puffs into the lungs every 6 (six) hours as needed for wheezing. 04/26/13  Yes Owens Loffler, MD  aspirin 325 MG buffered tablet Take 325 mg by mouth daily.     Yes Historical Provider, MD  Fluticasone-Salmeterol (ADVAIR) 250-50 MCG/DOSE AEPB Inhale 1 puff into the lungs 2 (two) times daily. 05/25/13  Yes Amy E Bedsole, MD  lisinopril (PRINIVIL,ZESTRIL) 2.5 MG tablet TAKE ONE TABLET BY MOUTH ONCE DAILY 11/07/13  Yes Lelon Perla, MD  Lutein 10 MG TABS Take 10 mg by mouth 2 (two) times daily.     Yes Historical Provider, MD  metoprolol tartrate (LOPRESSOR) 25 MG  tablet TAKE ONE TABLET BY MOUTH TWICE DAILY 10/11/13  Yes Lelon Perla, MD  nitroGLYCERIN (NITROSTAT) 0.4 MG SL tablet Place 1 tablet (0.4 mg total) under the tongue every 5 (five) minutes as needed for chest pain. 08/03/12  Yes Lelon Perla, MD  rosuvastatin (CRESTOR) 40 MG tablet Take 0.5 tablets (20 mg total) by mouth daily. 01/15/14  Yes Lelon Perla, MD  tiotropium (SPIRIVA HANDIHALER) 18 MCG inhalation capsule Place 1 capsule (18 mcg total) into inhaler and inhale daily. 05/25/13  Yes Amy Cletis [place], MD    HOSPITAL MEDICATIONS: I have reviewed the patient's current medications.  VITALS: Blood pressure 107/53, pulse 157, temperature 97.6 F (36.4 C), temperature source Oral, resp. rate 18, height 6' (1.829 m), weight 160 lb (72.576 kg), SpO2 96 %.  PHYSICAL EXAM: General appearance: alert, cooperative and no distress Neck: no carotid bruit and no JVD Lungs: clear to auscultation bilaterally Heart: regular rate and rhythm Abdomen: soft, non-tender; bowel sounds normal; no masses,  no organomegaly Extremities: extremities normal, atraumatic, no cyanosis or edema Pulses: 2+ and symmetric Skin: Skin color, texture, turgor normal. No rashes or lesions Neurologic: Grossly normal  LABS: Results for orders placed or performed during the hospital encounter of 04/09/14 (from the past 24 hour(s))  CBC with Differential/Platelet     Status: Abnormal   Collection Time: 04/09/14 11:50 AM  Result Value Ref Range   WBC 14.4 (H) 4.0 - 10.5 K/uL   RBC 4.11 (L) 4.22 - 5.81 MIL/uL   Hemoglobin 14.0 13.0 - 17.0 g/dL   HCT 42.4 39.0 - 52.0 %   MCV 103.2 (H) 78.0 - 100.0 fL   MCH 34.1 (H) 26.0 - 34.0 pg   MCHC 33.0 30.0 - 36.0 g/dL   RDW 13.5 11.5 - 15.5 %   Platelets 244 150 - 400 K/uL   Neutrophils Relative % 78 (H) 43 - 77 %   Neutro Abs 11.3 (H) 1.7 - 7.7 K/uL   Lymphocytes Relative 13 12 - 46 %   Lymphs Abs 1.9 0.7 - 4.0 K/uL   Monocytes Relative 8 3 - 12 %   Monocytes Absolute  1.1 (H) 0.1 - 1.0 K/uL   Eosinophils Relative 1 0 - 5 %   Eosinophils Absolute 0.1 0.0 - 0.7 K/uL   Basophils Relative 0 0 - 1 %   Basophils  Absolute 0.0 0.0 - 0.1 K/uL  Comprehensive metabolic panel     Status: Abnormal   Collection Time: 04/09/14 11:50 AM  Result Value Ref Range   Sodium 136 135 - 145 mmol/L   Potassium 5.0 3.5 - 5.1 mmol/L   Chloride 103 96 - 112 mmol/L   CO2 22 19 - 32 mmol/L   Glucose, Bld 153 (H) 70 - 99 mg/dL   BUN 16 6 - 23 mg/dL   Creatinine, Ser 1.37 (H) 0.50 - 1.35 mg/dL   Calcium 8.9 8.4 - 10.5 mg/dL   Total Protein 7.3 6.0 - 8.3 g/dL   Albumin 3.9 3.5 - 5.2 g/dL   AST 30 0 - 37 U/L   ALT 21 0 - 53 U/L   Alkaline Phosphatase 87 39 - 117 U/L   Total Bilirubin 0.8 0.3 - 1.2 mg/dL   GFR calc non Af Amer 48 (L) >90 mL/min   GFR calc Af Amer 56 (L) >90 mL/min   Anion gap 11 5 - 15  Troponin I     Status: None   Collection Time: 04/09/14 11:50 AM  Result Value Ref Range   Troponin I <0.03 <0.031 ng/mL  Magnesium     Status: None   Collection Time: 04/09/14 11:50 AM  Result Value Ref Range   Magnesium 1.8 1.5 - 2.5 mg/dL  Phosphorus     Status: None   Collection Time: 04/09/14 11:50 AM  Result Value Ref Range   Phosphorus 3.6 2.3 - 4.6 mg/dL  Protime-INR     Status: None   Collection Time: 04/09/14 11:50 AM  Result Value Ref Range   Prothrombin Time 14.3 11.6 - 15.2 seconds   INR 1.10 0.00 - 1.49  Troponin I-(serum)     Status: Abnormal   Collection Time: 04/09/14  2:50 PM  Result Value Ref Range   Troponin I 0.07 (H) <0.031 ng/mL  Protime-INR     Status: None   Collection Time: 04/09/14  2:50 PM  Result Value Ref Range   Prothrombin Time 13.8 11.6 - 15.2 seconds   INR 1.05 0.00 - 1.49  APTT     Status: None   Collection Time: 04/09/14  2:50 PM  Result Value Ref Range   aPTT 36 24 - 37 seconds  CBC     Status: Abnormal   Collection Time: 04/09/14  2:50 PM  Result Value Ref Range   WBC 16.4 (H) 4.0 - 10.5 K/uL   RBC 4.03 (L) 4.22 - 5.81  MIL/uL   Hemoglobin 14.0 13.0 - 17.0 g/dL   HCT 41.8 39.0 - 52.0 %   MCV 103.7 (H) 78.0 - 100.0 fL   MCH 34.7 (H) 26.0 - 34.0 pg   MCHC 33.5 30.0 - 36.0 g/dL   RDW 13.5 11.5 - 15.5 %   Platelets 204 150 - 400 K/uL  Creatinine, serum     Status: Abnormal   Collection Time: 04/09/14  2:50 PM  Result Value Ref Range   Creatinine, Ser 1.58 (H) 0.50 - 1.35 mg/dL   GFR calc non Af Amer 41 (L) >90 mL/min   GFR calc Af Amer 47 (L) >90 mL/min    EKG: CHB- VR rate 26  IMAGING:  Ct Cervical Spine Wo Contrast CT Head without contrast 04/09/2014   CLINICAL DATA:  Syncopal episode. Patient went to bathroom and woke up on the floor, now with hypotension and bradycardia. Bruising under the left eye. History of prostate and lung cancer.  EXAM: CT  HEAD WITHOUT CONTRAST  CT CERVICAL SPINE WITHOUT CONTRAST   IMPRESSION:  1. Mild soft tissue swelling about the superior lateral aspect the left orbit without associated radiopaque foreign body, displaced calvarial fracture or acute intracranial process. 2. Advanced microvascular ischemic disease. 3. No fracture or static subluxation of the cervical spine. 4. Mild to moderate multilevel cervical spine DDD, worse C5-C6 and C6-C7. Above findings discussed with Dr. Venora Maples at 12/2012.   Electronically Signed   By: Sandi Mariscal M.D.   On: 04/09/2014 12:21   Dg Chest Portable 1 View  04/09/2014   CLINICAL DATA:  Syncope with difficulty breathing  EXAM: PORTABLE CHEST - 1 VIEW  COMPARISON:  Chest radiograph February 24, 2012; chest CT August 22, 2013  FINDINGS: There is underlying emphysematous change. There is mild left base atelectasis. There is no edema or consolidation. Heart is slightly enlarged. Pulmonary vascularity reflects underlying emphysema. There is postoperative change overlying the left hilum. There is an old healed fracture of the right clavicle. No adenopathy.  IMPRESSION: Underlying emphysema. Mild atelectasis left base. No edema or consolidation. Heart  slightly enlarged but stable.   Electronically Signed   By: Lowella Grip III M.D.   On: 04/09/2014 12:29    IMPRESSION: Active Problems:   Complete heart block   Syncope and collapse   CAD S/P CFX BMS 2010   Essential hypertension   moderate COPD with emphysema   Acute renal insufficiency   AAA- 2.4 x 2/4   Cancer of lung, upper lobe, Right, in remission   RBBB   RECOMMENDATION: Dr Lovena Le to see. He is allergic tp PNC.   Time Spent Directly with Patient: 45 minutes  Erlene Quan [phone] beeper 04/09/2014, 3:53 PM   EP Attending  Patient seen and examined. Agree with above history, physical exam and plan. The patient has symptomatic complete heart block. He was on beta blockers and we will allow these to wash out today. If no return of his AV conduction, he will require PPM tomorrow. He has a temporary pm now. I have reviewed the risks/benefits/goals/expectations of ppm insertion and he is willing to proceed if necessary.  Mikle Bosworth.D.

## 2014-04-09 NOTE — Care Management Note (Signed)
    Page 1 of 1   04/09/2014     1:54:12 PM CARE MANAGEMENT NOTE 04/09/2014  Patient:  Patrick, Sampson   Account Number:  [id number]  Date Initiated:  04/09/2014  Documentation initiated by:  Elissa Hefty  Subjective/Objective Assessment:   adm w complete heart block     Action/Plan:   lives w wife, pcp dr Frederico Hamman [place]   Anticipated DC Date:     Anticipated DC Plan:  HOME/SELF CARE         Choice offered to / List presented to:             Status of service:   Medicare Important Message given?   (If response is "NO", the following Medicare IM given date fields will be blank) Date Medicare IM given:   Medicare IM given by:   Date Additional Medicare IM given:   Additional Medicare IM given by:    Discharge Disposition:    Per UR Regulation:  Reviewed for med. necessity/level of care/duration of stay  If discussed at [place] of Stay Meetings, dates discussed:    Comments:

## 2014-04-09 NOTE — Interval H&P Note (Signed)
History and Physical Interval Note:  04/09/2014 12:23 PM  Patrick Sampson  has presented today for surgery, with the diagnosis of CHB  The various methods of treatment have been discussed with the patient and family. After consideration of risks, benefits and other options for treatment, the patient has consented to  Procedure(s): TEMP WIRE (N/A) as a surgical intervention .  The patient's history has been reviewed, patient examined, no change in status, stable for surgery.  I have reviewed the patient's chart and labs.  Questions were answered to the patient's satisfaction.     Collier Salina [place] 04/09/2014 12:23 PM

## 2014-04-10 ENCOUNTER — Encounter (HOSPITAL_COMMUNITY)
Admission: EM | Disposition: A | Discharge status: Home / Self Care | Payer: Self-pay | Source: Home / Self Care | Attending: Cardiology

## 2014-04-10 ENCOUNTER — Encounter (HOSPITAL_COMMUNITY): Payer: Self-pay | Admitting: Cardiology

## 2014-04-10 ENCOUNTER — Inpatient Hospital Stay (HOSPITAL_COMMUNITY): Payer: Medicare Other

## 2014-04-10 DIAGNOSIS — I442 Atrioventricular block, complete: Secondary | ICD-10-CM

## 2014-04-10 DIAGNOSIS — N289 Disorder of kidney and ureter, unspecified: Secondary | ICD-10-CM

## 2014-04-10 DIAGNOSIS — I340 Nonrheumatic mitral (valve) insufficiency: Secondary | ICD-10-CM

## 2014-04-10 DIAGNOSIS — R55 Syncope and collapse: Secondary | ICD-10-CM | POA: Insufficient documentation

## 2014-04-10 DIAGNOSIS — I10 Essential (primary) hypertension: Secondary | ICD-10-CM

## 2014-04-10 DIAGNOSIS — I459 Conduction disorder, unspecified: Secondary | ICD-10-CM

## 2014-04-10 HISTORY — PX: PERMANENT PACEMAKER INSERTION: SHX5480

## 2014-04-10 LAB — CBC
HEMATOCRIT: 38.2 % — AB (ref 39.0–52.0)
Hemoglobin: 12.6 g/dL — ABNORMAL LOW (ref 13.0–17.0)
MCH: 33.6 pg (ref 26.0–34.0)
MCHC: 33 g/dL (ref 30.0–36.0)
MCV: 101.9 fL — ABNORMAL HIGH (ref 78.0–100.0)
PLATELETS: 186 10*3/uL (ref 150–400)
RBC: 3.75 MIL/uL — ABNORMAL LOW (ref 4.22–5.81)
RDW: 13.7 % (ref 11.5–15.5)
WBC: 12.8 10*3/uL — ABNORMAL HIGH (ref 4.0–10.5)

## 2014-04-10 LAB — LIPID PANEL
CHOL/HDL RATIO: 3.3 ratio
Cholesterol: 104 mg/dL (ref 0–200)
HDL: 32 mg/dL — AB (ref >39)
LDL Cholesterol: 47 mg/dL (ref 0–99)
Triglycerides: 126 mg/dL (ref <150)
VLDL: 25 mg/dL (ref 0–40)

## 2014-04-10 LAB — BASIC METABOLIC PANEL
ANION GAP: 9 (ref 5–15)
BUN: 26 mg/dL — ABNORMAL HIGH (ref 6–23)
CALCIUM: 8.5 mg/dL (ref 8.4–10.5)
CO2: 22 mmol/L (ref 19–32)
Chloride: 104 mmol/L (ref 96–112)
Creatinine, Ser: 1.51 mg/dL — ABNORMAL HIGH (ref 0.50–1.35)
GFR, EST AFRICAN AMERICAN: 50 mL/min — AB (ref >90)
GFR, EST NON AFRICAN AMERICAN: 43 mL/min — AB (ref >90)
Glucose, Bld: 130 mg/dL — ABNORMAL HIGH (ref 70–99)
Potassium: 4.6 mmol/L (ref 3.5–5.1)
Sodium: 135 mmol/L (ref 135–145)

## 2014-04-10 LAB — TROPONIN I: TROPONIN I: 0.23 ng/mL — AB (ref <0.031)

## 2014-04-10 SURGERY — PERMANENT PACEMAKER INSERTION

## 2014-04-10 MED ORDER — HYDROCODONE-ACETAMINOPHEN 5-325 MG PO TABS: 1.0000 | ORAL_TABLET | ORAL | Status: DC | PRN

## 2014-04-10 MED ORDER — MIDAZOLAM HCL 5 MG/5ML IJ SOLN
INTRAMUSCULAR | Status: AC
  Filled 2014-04-10: qty 5

## 2014-04-10 MED ORDER — VANCOMYCIN HCL IN DEXTROSE 1-5 GM/200ML-% IV SOLN
1000.0000 mg | Freq: Two times a day (BID) | INTRAVENOUS | Status: AC
  Administered 2014-04-11: 1000 mg via INTRAVENOUS
  Filled 2014-04-10: qty 200

## 2014-04-10 MED ORDER — SODIUM CHLORIDE 0.9 % IV SOLN: 250.0000 mL | INTRAVENOUS | Status: DC | PRN

## 2014-04-10 MED ORDER — LIDOCAINE HCL (PF) 1 % IJ SOLN
INTRAMUSCULAR | Status: AC
  Filled 2014-04-10: qty 60

## 2014-04-10 MED ORDER — METOPROLOL TARTRATE 25 MG PO TABS
25.0000 mg | ORAL_TABLET | Freq: Two times a day (BID) | ORAL | Status: DC
  Administered 2014-04-10 – 2014-04-11 (×2): 25 mg via ORAL
  Filled 2014-04-10 (×2): qty 1

## 2014-04-10 MED ORDER — ONDANSETRON HCL 4 MG/2ML IJ SOLN
4.0000 mg | Freq: Four times a day (QID) | INTRAMUSCULAR | Status: DC | PRN
Start: 2014-04-10 — End: 2014-04-11

## 2014-04-10 MED ORDER — SODIUM CHLORIDE 0.9 % IJ SOLN
3.0000 mL | Freq: Two times a day (BID) | INTRAMUSCULAR | Status: DC
  Administered 2014-04-10 – 2014-04-11 (×2): 3 mL via INTRAVENOUS

## 2014-04-10 MED ORDER — HEPARIN (PORCINE) IN NACL 2-0.9 UNIT/ML-% IJ SOLN
INTRAMUSCULAR | Status: AC
  Filled 2014-04-10: qty 500

## 2014-04-10 MED ORDER — CHLORHEXIDINE GLUCONATE 4 % EX LIQD
CUTANEOUS | Status: AC
  Administered 2014-04-10: 05:00:00
  Filled 2014-04-10: qty 60

## 2014-04-10 MED ORDER — ACETAMINOPHEN 325 MG PO TABS: 325.0000 mg | ORAL_TABLET | ORAL | Status: DC | PRN

## 2014-04-10 MED ORDER — SODIUM CHLORIDE 0.9 % IJ SOLN: 3.0000 mL | INTRAMUSCULAR | Status: DC | PRN

## 2014-04-10 NOTE — H&P (View-Only) (Signed)
SUBJECTIVE: The patient is doing well today.  At this time, he denies chest pain, shortness of breath, or any new concerns.  Admitted with syncope and CHB and underwent temporary pacemaker placement 04-09-14  CURRENT MEDICATIONS: . aspirin EC  81 mg Oral Daily  . gentamicin irrigation  80 mg Irrigation On Call  . lisinopril  2.5 mg Oral Daily  . rosuvastatin  20 mg Oral Daily  . sodium chloride  3 mL Intravenous Q12H  . tiotropium  18 mcg Inhalation Daily  . vancomycin  1,000 mg Intravenous On Call      OBJECTIVE: Physical Exam: Filed Vitals:   04/10/14 0500 04/10/14 0600 04/10/14 0700 04/10/14 0741  BP: 114/57 112/49 119/58   Pulse:      Temp:      TempSrc:      Resp: 19 18 20    Height:      Weight:      SpO2:    100%    Intake/Output Summary (Last 24 hours) at 04/10/14 0752 Last data filed at 04/10/14 0700  Gross per 24 hour  Intake    180 ml  Output   1090 ml  Net   -910 ml    Telemetry reveals sinus rhythm with V pacing at 70 and AV dissociation, SR 80's with pacer rate turned down this morning  GEN- The patient is well appearing, alert and oriented x 3 today.   Head- normocephalic, atraumatic Eyes-  Sclera clear, conjunctiva pink Ears- hearing intact Oropharynx- clear Neck- supple, no JVP Lungs- Clear to ausculation bilaterally, normal work of breathing Heart- Regular rate and rhythm, no murmurs, rubs or gallops  GI- soft, NT, ND, + BS Extremities- no clubbing, cyanosis, or edema, temp TV pacing wire right groin Skin- no rash or lesion Psych- euthymic mood, full affect Neuro- strength and sensation are intact  LABS: Basic Metabolic Panel:  Recent Labs  04/09/14 1150 04/09/14 1450 04/10/14 0126  NA 136  --  135  K 5.0  --  4.6  CL 103  --  104  CO2 22  --  22  GLUCOSE 153*  --  130*  BUN 16  --  26*  CREATININE 1.37* 1.58* 1.51*  CALCIUM 8.9  --  8.5  MG 1.8  --   --   PHOS 3.6  --   --    Liver Function Tests:  Recent Labs  04/09/14 1150  AST 30  ALT 21  ALKPHOS 87  BILITOT 0.8  PROT 7.3  ALBUMIN 3.9   CBC:  Recent Labs  04/09/14 1150 04/09/14 1450 04/10/14 0126  WBC 14.4* 16.4* 12.8*  NEUTROABS 11.3*  --   --   HGB 14.0 14.0 12.6*  HCT 42.4 41.8 38.2*  MCV 103.2* 103.7* 101.9*  PLT 244 204 186   Cardiac Enzymes:  Recent Labs  04/09/14 1450 04/09/14 1941 04/10/14 0126  TROPONINI 0.07* 0.21* 0.23*   Fasting Lipid Panel:  Recent Labs  04/10/14 0126  CHOL 104  HDL 32*  LDLCALC 47  TRIG 126  CHOLHDL 3.3   Thyroid Function Tests:  Recent Labs  04/09/14 1450  TSH 2.833    RADIOLOGY: Ct Head Wo Contrast 04/09/2014   CLINICAL DATA:  Syncopal episode. Patient went to bathroom and woke up on the floor, now with hypotension and bradycardia. Bruising under the left eye. History of prostate and lung cancer.  EXAM: CT HEAD WITHOUT CONTRAST  CT CERVICAL SPINE WITHOUT CONTRAST  TECHNIQUE: Multidetector CT imaging  of the head and cervical spine was performed following the standard protocol without intravenous contrast. Multiplanar CT image reconstructions of the cervical spine were also generated.  COMPARISON:  None.  FINDINGS: CT HEAD FINDINGS  There is mild soft tissue swelling about the superior lateral aspect of the left orbit (representative image 7, series 2). This finding is without associated radiopaque foreign body or displaced calvarial fracture.  Rather extensive periventricular hypodensities compatible of microvascular ischemic disease. Bilateral basal ganglial calcifications. The gray-white differentiation is otherwise well maintained without CT evidence of acute large territory infarct. No intraparenchymal or extra-axial mass or hemorrhage. Normal size and configuration of the ventricles and basilar cisterns. No midline shift. Intracranial atherosclerosis. Scattered opacification of the left anterior and the right anterior and posterior ethmoidal air cells. Minimal polypoid mucosal  thickening within the right maxillary sinus. The remaining paranasal sinuses and mastoid air cells are normally aerated. No air-fluid levels.  CT CERVICAL SPINE FINDINGS  C1 to the superior endplate of T4 is imaged.  Normal alignment of the cervical spine. No anterolisthesis or retrolisthesis. The bilateral facets are normally aligned. The dens is normally positioned between the lateral masses of C1. Mild atlantodental degenerative change. Normal atlantoaxial articulations.  No fracture or static subluxation of the cervical spine. Cervical vertebral body heights are preserved.  Mild to moderate multilevel cervical spine DDD, worse at C5-C6 and C6-C7 with disc space height loss, endplate irregularity and small posteriorly directed disc osteophyte complexes at these locations. There is partial ossification of the C3-C4 intervertebral disc space including complete fusion of the left C3-C4 transverse process ease.  Regional soft tissues appear normal. There is a minimal amount of air within the venous system of the neck, likely the sequela of recent intravenous access acquisition. Atherosclerotic plaque within the bilateral carotid bulbs. Normal noncontrast appearance of the thyroid gland.  Limited visualization of lung apices demonstrates postsurgical change of the left lung apex with mild centrilobular emphysematous change.  IMPRESSION: 1. Mild soft tissue swelling about the superior lateral aspect the left orbit without associated radiopaque foreign body, displaced calvarial fracture or acute intracranial process. 2. Advanced microvascular ischemic disease. 3. No fracture or static subluxation of the cervical spine. 4. Mild to moderate multilevel cervical spine DDD, worse C5-C6 and C6-C7. Above findings discussed with Dr. Venora Maples at 12/2012.   Electronically Signed   By: Sandi Mariscal M.D.   On: 04/09/2014 12:21   Dg Chest Portable 1 View 04/09/2014   CLINICAL DATA:  Syncope with difficulty breathing  EXAM: PORTABLE  CHEST - 1 VIEW  COMPARISON:  Chest radiograph February 24, 2012; chest CT August 22, 2013  FINDINGS: There is underlying emphysematous change. There is mild left base atelectasis. There is no edema or consolidation. Heart is slightly enlarged. Pulmonary vascularity reflects underlying emphysema. There is postoperative change overlying the left hilum. There is an old healed fracture of the right clavicle. No adenopathy.  IMPRESSION: Underlying emphysema. Mild atelectasis left base. No edema or consolidation. Heart slightly enlarged but stable.   Electronically Signed   By: Lowella Grip III M.D.   On: 04/09/2014 12:29    ASSESSMENT AND PLAN:  Active Problems:   CAD S/P CFX BMS 2010   AAA- 2.4 x 2/4   Cancer of lung, upper lobe, Right, in remission   Essential hypertension   moderate COPD with emphysema   Complete heart block   Syncope and collapse   Acute renal insufficiency   RBBB  1.  Symptomatic complete heart block  with syncope Check echo today Will turn pacing rate down to 50 to assess AV conduction after BB washout (SR 80's this morning) If recurrent heart block, plan PPM implantation today No driving x6 months (pt aware)  2.  CAD No recent ischemic symptoms Check echo today  3.  HTN Stable No change required today  [name], NP 04/10/2014 7:58 AM  I have seen, examined the patient, and reviewed the above assessment and plan.  On exam, RRR.  Intermittent complete heart block requiring ongoing temporary transvenous pacing despite adequate beta blocker washout.  Changes to above are made where necessary.  The patient has symptomatic AV block.  I would therefore recommend pacemaker implantation at this time.  Risks, benefits, alternatives to pacemaker implantation were discussed in detail with the patient today. The patient understands that the risks include but are not limited to bleeding, infection, pneumothorax, perforation, tamponade, vascular damage, renal failure, MI,  stroke, death,  and lead dislodgement and wishes to proceed. We will therefore schedule the procedure at the next available time.   Co Sign: Thompson Grayer, MD 04/10/2014 10:42 AM

## 2014-04-10 NOTE — Progress Notes (Signed)
*  PRELIMINARY RESULTS* Echocardiogram 2D Echocardiogram has been performed.  Patrick Sampson 04/10/2014, 9:36 AM

## 2014-04-10 NOTE — Progress Notes (Signed)
SUBJECTIVE: The patient is doing well today.  At this time, he denies chest pain, shortness of breath, or any new concerns.  Admitted with syncope and CHB and underwent temporary pacemaker placement 04-09-14  CURRENT MEDICATIONS: . aspirin EC  81 mg Oral Daily  . gentamicin irrigation  80 mg Irrigation On Call  . lisinopril  2.5 mg Oral Daily  . rosuvastatin  20 mg Oral Daily  . sodium chloride  3 mL Intravenous Q12H  . tiotropium  18 mcg Inhalation Daily  . vancomycin  1,000 mg Intravenous On Call      OBJECTIVE: Physical Exam: Filed Vitals:   04/10/14 0500 04/10/14 0600 04/10/14 0700 04/10/14 0741  BP: 114/57 112/49 119/58   Pulse:      Temp:      TempSrc:      Resp: 19 18 20    Height:      Weight:      SpO2:    100%    Intake/Output Summary (Last 24 hours) at 04/10/14 0752 Last data filed at 04/10/14 0700  Gross per 24 hour  Intake    180 ml  Output   1090 ml  Net   -910 ml    Telemetry reveals sinus rhythm with V pacing at 70 and AV dissociation, SR 80's with pacer rate turned down this morning  GEN- The patient is well appearing, alert and oriented x 3 today.   Head- normocephalic, atraumatic Eyes-  Sclera clear, conjunctiva pink Ears- hearing intact Oropharynx- clear Neck- supple, no JVP Lungs- Clear to ausculation bilaterally, normal work of breathing Heart- Regular rate and rhythm, no murmurs, rubs or gallops  GI- soft, NT, ND, + BS Extremities- no clubbing, cyanosis, or edema, temp TV pacing wire right groin Skin- no rash or lesion Psych- euthymic mood, full affect Neuro- strength and sensation are intact  LABS: Basic Metabolic Panel:  Recent Labs  04/09/14 1150 04/09/14 1450 04/10/14 0126  NA 136  --  135  K 5.0  --  4.6  CL 103  --  104  CO2 22  --  22  GLUCOSE 153*  --  130*  BUN 16  --  26*  CREATININE 1.37* 1.58* 1.51*  CALCIUM 8.9  --  8.5  MG 1.8  --   --   PHOS 3.6  --   --    Liver Function Tests:  Recent Labs  04/09/14 1150  AST 30  ALT 21  ALKPHOS 87  BILITOT 0.8  PROT 7.3  ALBUMIN 3.9   CBC:  Recent Labs  04/09/14 1150 04/09/14 1450 04/10/14 0126  WBC 14.4* 16.4* 12.8*  NEUTROABS 11.3*  --   --   HGB 14.0 14.0 12.6*  HCT 42.4 41.8 38.2*  MCV 103.2* 103.7* 101.9*  PLT 244 204 186   Cardiac Enzymes:  Recent Labs  04/09/14 1450 04/09/14 1941 04/10/14 0126  TROPONINI 0.07* 0.21* 0.23*   Fasting Lipid Panel:  Recent Labs  04/10/14 0126  CHOL 104  HDL 32*  LDLCALC 47  TRIG 126  CHOLHDL 3.3   Thyroid Function Tests:  Recent Labs  04/09/14 1450  TSH 2.833    RADIOLOGY: Ct Head Wo Contrast 04/09/2014   CLINICAL DATA:  Syncopal episode. Patient went to bathroom and woke up on the floor, now with hypotension and bradycardia. Bruising under the left eye. History of prostate and lung cancer.  EXAM: CT HEAD WITHOUT CONTRAST  CT CERVICAL SPINE WITHOUT CONTRAST  TECHNIQUE: Multidetector CT imaging  of the head and cervical spine was performed following the standard protocol without intravenous contrast. Multiplanar CT image reconstructions of the cervical spine were also generated.  COMPARISON:  None.  FINDINGS: CT HEAD FINDINGS  There is mild soft tissue swelling about the superior lateral aspect of the left orbit (representative image 7, series 2). This finding is without associated radiopaque foreign body or displaced calvarial fracture.  Rather extensive periventricular hypodensities compatible of microvascular ischemic disease. Bilateral basal ganglial calcifications. The gray-white differentiation is otherwise well maintained without CT evidence of acute large territory infarct. No intraparenchymal or extra-axial mass or hemorrhage. Normal size and configuration of the ventricles and basilar cisterns. No midline shift. Intracranial atherosclerosis. Scattered opacification of the left anterior and the right anterior and posterior ethmoidal air cells. Minimal polypoid mucosal  thickening within the right maxillary sinus. The remaining paranasal sinuses and mastoid air cells are normally aerated. No air-fluid levels.  CT CERVICAL SPINE FINDINGS  C1 to the superior endplate of T4 is imaged.  Normal alignment of the cervical spine. No anterolisthesis or retrolisthesis. The bilateral facets are normally aligned. The dens is normally positioned between the lateral masses of C1. Mild atlantodental degenerative change. Normal atlantoaxial articulations.  No fracture or static subluxation of the cervical spine. Cervical vertebral body heights are preserved.  Mild to moderate multilevel cervical spine DDD, worse at C5-C6 and C6-C7 with disc space height loss, endplate irregularity and small posteriorly directed disc osteophyte complexes at these locations. There is partial ossification of the C3-C4 intervertebral disc space including complete fusion of the left C3-C4 transverse process ease.  Regional soft tissues appear normal. There is a minimal amount of air within the venous system of the neck, likely the sequela of recent intravenous access acquisition. Atherosclerotic plaque within the bilateral carotid bulbs. Normal noncontrast appearance of the thyroid gland.  Limited visualization of lung apices demonstrates postsurgical change of the left lung apex with mild centrilobular emphysematous change.  IMPRESSION: 1. Mild soft tissue swelling about the superior lateral aspect the left orbit without associated radiopaque foreign body, displaced calvarial fracture or acute intracranial process. 2. Advanced microvascular ischemic disease. 3. No fracture or static subluxation of the cervical spine. 4. Mild to moderate multilevel cervical spine DDD, worse C5-C6 and C6-C7. Above findings discussed with Dr. Venora Maples at 12/2012.   Electronically Signed   By: Sandi Mariscal M.D.   On: 04/09/2014 12:21   Dg Chest Portable 1 View 04/09/2014   CLINICAL DATA:  Syncope with difficulty breathing  EXAM: PORTABLE  CHEST - 1 VIEW  COMPARISON:  Chest radiograph February 24, 2012; chest CT August 22, 2013  FINDINGS: There is underlying emphysematous change. There is mild left base atelectasis. There is no edema or consolidation. Heart is slightly enlarged. Pulmonary vascularity reflects underlying emphysema. There is postoperative change overlying the left hilum. There is an old healed fracture of the right clavicle. No adenopathy.  IMPRESSION: Underlying emphysema. Mild atelectasis left base. No edema or consolidation. Heart slightly enlarged but stable.   Electronically Signed   By: Lowella Grip III M.D.   On: 04/09/2014 12:29    ASSESSMENT AND PLAN:  Active Problems:   CAD S/P CFX BMS 2010   AAA- 2.4 x 2/4   Cancer of lung, upper lobe, Right, in remission   Essential hypertension   moderate COPD with emphysema   Complete heart block   Syncope and collapse   Acute renal insufficiency   RBBB  1.  Symptomatic complete heart block  with syncope Check echo today Will turn pacing rate down to 50 to assess AV conduction after BB washout (SR 80's this morning) If recurrent heart block, plan PPM implantation today No driving x6 months (pt aware)  2.  CAD No recent ischemic symptoms Check echo today  3.  HTN Stable No change required today  [name], NP 04/10/2014 7:58 AM  I have seen, examined the patient, and reviewed the above assessment and plan.  On exam, RRR.  Intermittent complete heart block requiring ongoing temporary transvenous pacing despite adequate beta blocker washout.  Changes to above are made where necessary.  The patient has symptomatic AV block.  I would therefore recommend pacemaker implantation at this time.  Risks, benefits, alternatives to pacemaker implantation were discussed in detail with the patient today. The patient understands that the risks include but are not limited to bleeding, infection, pneumothorax, perforation, tamponade, vascular damage, renal failure, MI,  stroke, death,  and lead dislodgement and wishes to proceed. We will therefore schedule the procedure at the next available time.   Co Sign: Thompson Grayer, MD 04/10/2014 10:42 AM

## 2014-04-10 NOTE — Progress Notes (Signed)
Site area:Right groin, 6 Fr venous sheath  Site Prior to Removal: Level 0  Pressure Applied For  63min  Minutes Beginning at 16:19  Manual: Yes  Patient Status During Pull:Vitals stable, without complaints  Post Pull Groin Site: level 0  Post Pull Instructions Given:  Yes  Post Pull Pulses Present:  Yes, Right DP palpable  Dressing Applied: gauze with clear tegaderm

## 2014-04-10 NOTE — Op Note (Signed)
SURGEON:  Thompson Grayer, MD     PREPROCEDURE DIAGNOSIS:  Symptomatic complete heart block    POSTPROCEDURE DIAGNOSIS:  Symptomatic complete heart block     PROCEDURES:   1. Left upper extremity venography.   2. Pacemaker implantation.     INTRODUCTION:  Patrick Sampson is a 80 y.o. male with a history of complete heart block and syncope who presents today for pacemaker implantation.  The patient reports intermittent episodes of dizziness over the past few days.  He has had frank syncope.  He subsequently has been found to have complete heart block requiring temporary pacing.  No reversible causes have been identified.  The patient therefore presents today for pacemaker implantation.     DESCRIPTION OF PROCEDURE:  Informed written consent was obtained, and  the patient was brought to the electrophysiology lab in a fasting state.  The patient required no sedation for the procedure today.  The patients left chest was prepped and draped in the usual sterile fashion by the EP lab staff. The skin overlying the left deltopectoral region was infiltrated with lidocaine for local analgesia.  A 4-cm incision was made over the left deltopectoral region.  A left subcutaneous pacemaker pocket was fashioned using a combination of sharp and blunt dissection. Electrocautery was required to assure hemostasis.    Left Upper Extremity Venography: A venogram of the left upper extremity was performed, which revealed a large left cephalic vein, which emptied into a large left subclavian vein.  The left axillary vein was moderate in size.    RA/RV Lead Placement: The left axillary vein was therefore cannulated.  Through the left axillary vein, a [employer] model [phone] (serial number  [id number]) right atrial lead and a [facility] model [phone] (serial number  [id number]) right ventricular lead were advanced with fluoroscopic visualization into the right atrial appendage and right ventricular apex positions  respectively.  Initial atrial lead P- waves measured 1.6 mV with impedance of 625 ohms and a threshold of 1.7 V at 0.5 msec.  Right ventricular lead R-waves measured 15 mV (paced) with an impedance of 727 ohms and a threshold of 0.4 V at 0.5 msec.  Both leads were secured to the pectoralis fascia using #2-0 silk over the suture sleeves.   Device Placement:  The leads were then connected to a [employer] DR  model [id number] (serial number  [id number]) pacemaker.  The pocket was irrigated with copious gentamicin solution.  The pacemaker was then placed into the pocket.  The pocket was then closed in 2 layers with 2.0 Vicryl suture for the subcutaneous and subcuticular layers.  Steri-  Strips and a sterile dressing were then applied. EBL<81ml.  There were no early apparent complications.     CONCLUSIONS:   1. Successful implantation of a [employer] DR  dual-chamber pacemaker for symptomatic complete heart block  2. No early apparent complications.        Permanent Pacemaker Indication: Documented non-reversible symptomatic bradycardia due to second degree and/or third degree atrioventricular block.     Thompson Grayer, MD 04/10/2014 3:47 PM

## 2014-04-10 NOTE — Progress Notes (Signed)
Received patient from cath lab post left chest pacemaker implant.  Oxygen saturations 89-90% room air.  Placed on 2 liters oxygen and sats 94-95%.  Patrick Bayley NP paged and notified. Orders received.  Dr. Rayann Heman on floor and made aware and assessed patient.  Will continue to monitor.  Patrick Sampson

## 2014-04-10 NOTE — Interval H&P Note (Signed)
History and Physical Interval Note:  04/10/2014 10:43 AM  Patrick Sampson  has presented today for surgery, with the diagnosis of snycope  The various methods of treatment have been discussed with the patient and family. After consideration of risks, benefits and other options for treatment, the patient has consented to  Procedure(s): PERMANENT PACEMAKER INSERTION (N/A) as a surgical intervention .  The patient's history has been reviewed, patient examined, no change in status, stable for surgery.  I have reviewed the patient's chart and labs.  Questions were answered to the patient's satisfaction.     Thompson Grayer

## 2014-04-11 ENCOUNTER — Inpatient Hospital Stay (HOSPITAL_COMMUNITY): Payer: Medicare Other

## 2014-04-11 MED ORDER — ASPIRIN 81 MG PO TBEC: 81.0000 mg | DELAYED_RELEASE_TABLET | Freq: Every day | ORAL | Status: AC

## 2014-04-11 NOTE — Progress Notes (Signed)
SUBJECTIVE: The patient is doing well today.  At this time, he denies chest pain, shortness of breath, or any new concerns.  Marland Kitchen aspirin EC  81 mg Oral Daily  . lisinopril  2.5 mg Oral Daily  . metoprolol tartrate  25 mg Oral BID  . rosuvastatin  20 mg Oral Daily  . sodium chloride  3 mL Intravenous Q12H  . tiotropium  18 mcg Inhalation Daily      OBJECTIVE: Physical Exam: Filed Vitals:   04/10/14 2310 04/11/14 0000 04/11/14 0400 04/11/14 0738  BP:  127/75 130/72   Pulse:  83 83   Temp:  98.5 F (36.9 C) 98.3 F (36.8 C) 98.4 F (36.9 C)  TempSrc:  Oral Oral Oral  Resp:  20 20   Height:      Weight:   158 lb 1.1 oz (71.7 kg)   SpO2: 94% 97% 96%     Intake/Output Summary (Last 24 hours) at 04/11/14 0818 Last data filed at 04/11/14 8768  Gross per 24 hour  Intake    250 ml  Output   1850 ml  Net  -1600 ml    Telemetry reveals sinus rhythm with V pacing  GEN- The patient is well appearing, alert and oriented x 3 today.   Head- normocephalic, atraumatic Eyes-  Sclera clear, conjunctiva pink Ears- hearing intact Oropharynx- clear Neck- supple, no JVP Lymph- no cervical lymphadenopathy Lungs- Clear to ausculation bilaterally, normal work of breathing Heart- Regular rate and rhythm, no murmurs, rubs or gallops, PMI not laterally displaced GI- soft, NT, ND, + BS Extremities- no clubbing, cyanosis, or edema Skin- no hematoma  LABS: Basic Metabolic Panel:  Recent Labs  04/09/14 1150 04/09/14 1450 04/10/14 0126  NA 136  --  135  K 5.0  --  4.6  CL 103  --  104  CO2 22  --  22  GLUCOSE 153*  --  130*  BUN 16  --  26*  CREATININE 1.37* 1.58* 1.51*  CALCIUM 8.9  --  8.5  MG 1.8  --   --   PHOS 3.6  --   --    Liver Function Tests:  Recent Labs  04/09/14 1150  AST 30  ALT 21  ALKPHOS 87  BILITOT 0.8  PROT 7.3  ALBUMIN 3.9   No results for input(s): LIPASE, AMYLASE in the last 72 hours. CBC:  Recent Labs  04/09/14 1150 04/09/14 1450  04/10/14 0126  WBC 14.4* 16.4* 12.8*  NEUTROABS 11.3*  --   --   HGB 14.0 14.0 12.6*  HCT 42.4 41.8 38.2*  MCV 103.2* 103.7* 101.9*  PLT 244 204 186   Cardiac Enzymes:  Recent Labs  04/09/14 1450 04/09/14 1941 04/10/14 0126  TROPONINI 0.07* 0.21* 0.23*   BNP: Invalid input(s): POCBNP D-Dimer: No results for input(s): DDIMER in the last 72 hours. Hemoglobin A1C: No results for input(s): HGBA1C in the last 72 hours. Fasting Lipid Panel:  Recent Labs  04/10/14 0126  CHOL 104  HDL 32*  LDLCALC 47  TRIG 126  CHOLHDL 3.3   Thyroid Function Tests:  Recent Labs  04/09/14 1450  TSH 2.833   Anemia Panel: No results for input(s): VITAMINB12, FOLATE, FERRITIN, TIBC, IRON, RETICCTPCT in the last 72 hours.  RADIOLOGY: Dg Chest 2 View  04/11/2014   CLINICAL DATA:  Pacemaker.  EXAM: CHEST  2 VIEW  COMPARISON:  04/10/2014.  02/24/2012.  CT 08/22/2013.  FINDINGS: Mediastinum hilar structures are unremarkable. Heart size normal.  Cardiac pacer noted with lead tips in right atrium right ventricle. Postsurgical changes right lung. Diffuse mild pulmonary interstitial changes noted. Mild pneumonitis or pulmonary interstitial edema cannot be excluded. Stable bibasilar pleural parenchymal thickening noted consistent with scarring. No pneumothorax. No acute osseous abnormality.  IMPRESSION: 1. Cardiac pacer in stable position. Heart size stable. Pulmonary vascularity normal. 2. Mild increased interstitial prominence noted bilaterally. Mild pneumonitis and/or interstitial edema cannot be excluded. Basal pleural parenchymal scarring again noted. 3. Postsurgical changes right lung.   Electronically Signed   By: Marcello Moores  Register   On: 04/11/2014 07:43   Ct Head Wo Contrast  04/09/2014   CLINICAL DATA:  Syncopal episode. Patient went to bathroom and woke up on the floor, now with hypotension and bradycardia. Bruising under the left eye. History of prostate and lung cancer.  EXAM: CT HEAD WITHOUT  CONTRAST  CT CERVICAL SPINE WITHOUT CONTRAST  TECHNIQUE: Multidetector CT imaging of the head and cervical spine was performed following the standard protocol without intravenous contrast. Multiplanar CT image reconstructions of the cervical spine were also generated.  COMPARISON:  None.  FINDINGS: CT HEAD FINDINGS  There is mild soft tissue swelling about the superior lateral aspect of the left orbit (representative image 7, series 2). This finding is without associated radiopaque foreign body or displaced calvarial fracture.  Rather extensive periventricular hypodensities compatible of microvascular ischemic disease. Bilateral basal ganglial calcifications. The gray-white differentiation is otherwise well maintained without CT evidence of acute large territory infarct. No intraparenchymal or extra-axial mass or hemorrhage. Normal size and configuration of the ventricles and basilar cisterns. No midline shift. Intracranial atherosclerosis. Scattered opacification of the left anterior and the right anterior and posterior ethmoidal air cells. Minimal polypoid mucosal thickening within the right maxillary sinus. The remaining paranasal sinuses and mastoid air cells are normally aerated. No air-fluid levels.  CT CERVICAL SPINE FINDINGS  C1 to the superior endplate of T4 is imaged.  Normal alignment of the cervical spine. No anterolisthesis or retrolisthesis. The bilateral facets are normally aligned. The dens is normally positioned between the lateral masses of C1. Mild atlantodental degenerative change. Normal atlantoaxial articulations.  No fracture or static subluxation of the cervical spine. Cervical vertebral body heights are preserved.  Mild to moderate multilevel cervical spine DDD, worse at C5-C6 and C6-C7 with disc space height loss, endplate irregularity and small posteriorly directed disc osteophyte complexes at these locations. There is partial ossification of the C3-C4 intervertebral disc space including  complete fusion of the left C3-C4 transverse process ease.  Regional soft tissues appear normal. There is a minimal amount of air within the venous system of the neck, likely the sequela of recent intravenous access acquisition. Atherosclerotic plaque within the bilateral carotid bulbs. Normal noncontrast appearance of the thyroid gland.  Limited visualization of lung apices demonstrates postsurgical change of the left lung apex with mild centrilobular emphysematous change.  IMPRESSION: 1. Mild soft tissue swelling about the superior lateral aspect the left orbit without associated radiopaque foreign body, displaced calvarial fracture or acute intracranial process. 2. Advanced microvascular ischemic disease. 3. No fracture or static subluxation of the cervical spine. 4. Mild to moderate multilevel cervical spine DDD, worse C5-C6 and C6-C7. Above findings discussed with Dr. Venora Maples at 12/2012.   Electronically Signed   By: Sandi Mariscal M.D.   On: 04/09/2014 12:21   Ct Cervical Spine Wo Contrast  04/09/2014   CLINICAL DATA:  Syncopal episode. Patient went to bathroom and woke up on the floor, now with  hypotension and bradycardia. Bruising under the left eye. History of prostate and lung cancer.  EXAM: CT HEAD WITHOUT CONTRAST  CT CERVICAL SPINE WITHOUT CONTRAST  TECHNIQUE: Multidetector CT imaging of the head and cervical spine was performed following the standard protocol without intravenous contrast. Multiplanar CT image reconstructions of the cervical spine were also generated.  COMPARISON:  None.  FINDINGS: CT HEAD FINDINGS  There is mild soft tissue swelling about the superior lateral aspect of the left orbit (representative image 7, series 2). This finding is without associated radiopaque foreign body or displaced calvarial fracture.  Rather extensive periventricular hypodensities compatible of microvascular ischemic disease. Bilateral basal ganglial calcifications. The gray-white differentiation is otherwise  well maintained without CT evidence of acute large territory infarct. No intraparenchymal or extra-axial mass or hemorrhage. Normal size and configuration of the ventricles and basilar cisterns. No midline shift. Intracranial atherosclerosis. Scattered opacification of the left anterior and the right anterior and posterior ethmoidal air cells. Minimal polypoid mucosal thickening within the right maxillary sinus. The remaining paranasal sinuses and mastoid air cells are normally aerated. No air-fluid levels.  CT CERVICAL SPINE FINDINGS  C1 to the superior endplate of T4 is imaged.  Normal alignment of the cervical spine. No anterolisthesis or retrolisthesis. The bilateral facets are normally aligned. The dens is normally positioned between the lateral masses of C1. Mild atlantodental degenerative change. Normal atlantoaxial articulations.  No fracture or static subluxation of the cervical spine. Cervical vertebral body heights are preserved.  Mild to moderate multilevel cervical spine DDD, worse at C5-C6 and C6-C7 with disc space height loss, endplate irregularity and small posteriorly directed disc osteophyte complexes at these locations. There is partial ossification of the C3-C4 intervertebral disc space including complete fusion of the left C3-C4 transverse process ease.  Regional soft tissues appear normal. There is a minimal amount of air within the venous system of the neck, likely the sequela of recent intravenous access acquisition. Atherosclerotic plaque within the bilateral carotid bulbs. Normal noncontrast appearance of the thyroid gland.  Limited visualization of lung apices demonstrates postsurgical change of the left lung apex with mild centrilobular emphysematous change.  IMPRESSION: 1. Mild soft tissue swelling about the superior lateral aspect the left orbit without associated radiopaque foreign body, displaced calvarial fracture or acute intracranial process. 2. Advanced microvascular ischemic  disease. 3. No fracture or static subluxation of the cervical spine. 4. Mild to moderate multilevel cervical spine DDD, worse C5-C6 and C6-C7. Above findings discussed with Dr. Venora Maples at 12/2012.   Electronically Signed   By: Sandi Mariscal M.D.   On: 04/09/2014 12:21   Dg Chest Port 1 View  04/10/2014   CLINICAL DATA:  Respiratory distress for 1 day  EXAM: PORTABLE CHEST - 1 VIEW  COMPARISON:  04/09/2014  FINDINGS: Left chest wall pacer device is noted with lead in the right atrial appendage and right ventricle. Heart size is normal. There is calcified plaque within the aortic arch. No pleural effusion or edema identified. There is increased lung volumes with coarsened interstitial markings suggestive of emphysema.  IMPRESSION: 1. No acute cardiopulmonary abnormalities. 2. Emphysema. 3. Atherosclerosis   Electronically Signed   By: Kerby Moors M.D.   On: 04/10/2014 19:18   Dg Chest Portable 1 View  04/09/2014   CLINICAL DATA:  Syncope with difficulty breathing  EXAM: PORTABLE CHEST - 1 VIEW  COMPARISON:  Chest radiograph February 24, 2012; chest CT August 22, 2013  FINDINGS: There is underlying emphysematous change. There is mild left  base atelectasis. There is no edema or consolidation. Heart is slightly enlarged. Pulmonary vascularity reflects underlying emphysema. There is postoperative change overlying the left hilum. There is an old healed fracture of the right clavicle. No adenopathy.  IMPRESSION: Underlying emphysema. Mild atelectasis left base. No edema or consolidation. Heart slightly enlarged but stable.   Electronically Signed   By: Lowella Grip III M.D.   On: 04/09/2014 12:29    ASSESSMENT AND PLAN:  Active Problems:   CAD S/P CFX BMS 2010   AAA- 2.4 x 2/4   Cancer of lung, upper lobe, Right, in remission   Essential hypertension   moderate COPD with emphysema   Complete heart block   Syncope and collapse   Acute renal insufficiency   RBBB   Syncope  1. Syncope/ CHB Doing well  s/p PPM cxr is stable Device interrogation is reviewed and normal  2. htn Stable No change required today  No driving x 6 weeks Routine wound care and follow-up  Discharge to home today  Thompson Grayer, MD 04/11/2014 8:18 AM

## 2014-04-11 NOTE — Discharge Summary (Signed)
ELECTROPHYSIOLOGY PROCEDURE DISCHARGE SUMMARY    Patient ID: Patrick Sampson,  MRN: [id number], DOB/AGE: 78/15/1936 78 y.o.  Admit date: 04/09/2014 Discharge date: 04/11/2014  Primary Care Physician: Patrick Loffler, MD Primary Cardiologist: Patrick Sampson Electrophysiologist: Patrick Sampson  Primary Discharge Diagnosis:  Symptomatic complete heart block status post pacemaker implantation this admission  Secondary Discharge Diagnosis:  1.  CAD 2.  Prostate cancer 3.  Hyperlipidemia 4.  COPD 5.  AAA  Allergies  Allergen Reactions  . Amoxicillin Rash     Procedures This Admission:  1.   Placement of a temporary transvenous pacemaker on 04/09/14 by Patrick Sampson.  There were no early apparent complications.  2.  Echocardiogram on 04/10/14 demonstrated EF 55-60%, mild MR, PA pressure 59. 3.  Implantation of a STJ dual chamber PPM on 04/10/14 by Patrick Patrick Sampson.  The patient received a STJ model number Assurity PPM with model number 2088 right atrial lead and 1948 right ventricular lead. There were no immediate post procedure complications. 2.  CXR on 04/11/14 demonstrated no pneumothorax status post device implantation.   Brief HPI/Hospital Course: Patrick Sampson is a 80 y.o. male with a past medical history as outlined above.  On the day of admission, he had a syncopal spell and EMS was called.  He was found to be in CHB and was transferred to [facility] for further evaluation.  He underwent temporary transvenous pacemaker placement and after beta blocker washout had persistent complete heart block.  He was evaluated by EP who recommended pacemaker implantation.  Risks, benefits, and alternatives to PPM implantation were reviewed with the patient who wished to proceed.   The patient underwent implantation of a STJ dual chamber pacemaker with details as outlined above.  He was monitored on telemetry overnight which demonstrated sinus rhythm with ventricular pacing.  Left chest was without hematoma or ecchymosis.   The device was interrogated and found to be functioning normally.  CXR was obtained and demonstrated no pneumothorax status post device implantation.  Wound care, arm mobility, and restrictions were reviewed with the patient.  Tthe patient was examined and considered stable for discharge to home.   With syncope, the patient is aware that according to Patrick Sampson he should not drive for 6 months.    Physical Exam: Filed Vitals:   04/11/14 0000 04/11/14 0400 04/11/14 0738 04/11/14 0809  BP: 127/75 130/72  122/68  Pulse: 83 83  83  Temp: 98.5 F (36.9 C) 98.3 F (36.8 C) 98.4 F (36.9 C)   TempSrc: Oral Oral Oral   Resp: 20 20    Height:      Weight:  158 lb 1.1 oz (71.7 kg)    SpO2: 97% 96%  97%   Labs:   Lab Results  Component Value Date   WBC 12.8* 04/10/2014   HGB 12.6* 04/10/2014   HCT 38.2* 04/10/2014   MCV 101.9* 04/10/2014   PLT 186 04/10/2014    Recent Labs Lab 04/09/14 1150  04/10/14 0126  NA 136  --  135  K 5.0  --  4.6  CL 103  --  104  CO2 22  --  22  BUN 16  --  26*  CREATININE 1.37*  < > 1.51*  CALCIUM 8.9  --  8.5  PROT 7.3  --   --   BILITOT 0.8  --   --   ALKPHOS 87  --   --   ALT 21  --   --   AST  30  --   --   GLUCOSE 153*  --  130*  < > = values in this interval not displayed.  Discharge Medications:    Medication List    STOP taking these medications        aspirin 325 MG buffered tablet      TAKE these medications        albuterol 108 (90 BASE) MCG/ACT inhaler  Commonly known as:  PROVENTIL HFA;VENTOLIN HFA  Inhale 2 puffs into the lungs every 6 (six) hours as needed for wheezing.     aspirin 81 MG EC tablet  Take 1 tablet (81 mg total) by mouth daily.     Fluticasone-Salmeterol 250-50 MCG/DOSE Aepb  Commonly known as:  ADVAIR  Inhale 1 puff into the lungs 2 (two) times daily.     lisinopril 2.5 MG tablet  Commonly known as:  PRINIVIL,ZESTRIL  TAKE ONE TABLET BY MOUTH ONCE DAILY     Lutein 10 MG Tabs  Take 10 mg by mouth 2  (two) times daily.     metoprolol tartrate 25 MG tablet  Commonly known as:  LOPRESSOR  TAKE ONE TABLET BY MOUTH TWICE DAILY     nitroGLYCERIN 0.4 MG SL tablet  Commonly known as:  NITROSTAT  Place 1 tablet (0.4 mg total) under the tongue every 5 (five) minutes as needed for chest pain.     rosuvastatin 40 MG tablet  Commonly known as:  CRESTOR  Take 0.5 tablets (20 mg total) by mouth daily.     tiotropium 18 MCG inhalation capsule  Commonly known as:  SPIRIVA HANDIHALER  Place 1 capsule (18 mcg total) into inhaler and inhale daily.        Disposition:      Discharge Instructions    Diet - low sodium heart healthy    Complete by:  As directed      Increase activity slowly    Complete by:  As directed           Follow-up Information    Follow up with Patrick Berthold, NP On 04/25/2014.   Specialty:  Nurse Practitioner   Why:  at [facility] information:   [place] [address] [phone]       Follow up with Patrick Grayer, MD On 07/11/2014.   Specialty:  Cardiology   Why:  at [address] information:   [place] [address] [phone]       Duration of Discharge Encounter: Greater than 30 minutes including physician time.  Signed, Patrick Marshall, NP 04/11/2014 5:56 PM   Patrick Grayer MD

## 2014-04-11 NOTE — Discharge Instructions (Signed)
° ° °  Supplemental Discharge Instructions for  Pacemaker/Defibrillator Patients  Activity No heavy lifting or vigorous activity with your left/right arm for 6 to 8 weeks.  Do not raise your left/right arm above your head for one week.  Gradually raise your affected arm as drawn below.           __3-27-16                           04-16-14                  04-17-14                  04-18-14  NO DRIVING for  6 months    WOUND CARE - Keep the wound area clean and dry.  Do not get this area wet for one week. No showers for one week; you may shower on   04-18-14  . - The tape/steri-strips on your wound will fall off; do not pull them off.  No bandage is needed on the site.  DO  NOT apply any creams, oils, or ointments to the wound area. - If you notice any drainage or discharge from the wound, any swelling or bruising at the site, or you develop a fever > 101? F after you are discharged home, call the office at once.  Special Instructions - You are still able to use cellular telephones; use the ear opposite the side where you have your pacemaker/defibrillator.  Avoid carrying your cellular phone near your device. - When traveling through airports, show security personnel your identification card to avoid being screened in the metal detectors.  Ask the security personnel to use the hand wand. - Avoid arc welding equipment, MRI testing (magnetic resonance imaging), TENS units (transcutaneous nerve stimulators).  Call the office for questions about other devices. - Avoid electrical appliances that are in poor condition or are not properly grounded. - Microwave ovens are safe to be near or to operate.

## 2014-04-11 NOTE — Progress Notes (Signed)
MD on call Radford Pax) notified via text page of long QTC on the morning EKG.

## 2014-04-11 NOTE — Progress Notes (Signed)
Pt discharged to home per MD order. Pt received and reviewed all discharge instructions and medication information including follow-up appointments and prescription information. Pt verbalized understanding. Pt alert and oriented at discharge with no complaints of pain. Pt IV and telemetry box removed prior to discharge. Pt escorted to private vehicle via wheelchair by guest services volunteer. Patrick Sampson

## 2014-04-24 ENCOUNTER — Encounter: Payer: Self-pay | Admitting: Nurse Practitioner

## 2014-04-24 NOTE — Progress Notes (Signed)
Electrophysiology Office Note Date: 04/25/2014  ID:  Patrick Sampson, DOB February 06, 1934, MRN [id number]  PCP: Owens Loffler, MD Primary Cardiologist: Stanford Breed Electrophysiologist: Allred  CC: Pacemaker post-hospitalization follow-up  Patrick Sampson is a 80 y.o. male is seen today for Dr Rayann Heman.  He presents today for post hospital electrophysiology followup.  He was admitted 04/09/14 through 04/11/14 after a syncopal episode at home. Upon EMS arrival, he was found to be in CHB and was transported to [facility] where he underwent temporary transvenous pacemaker placement.  His beta blocker was allowed to wash out with persistent heart block.  He underwent STJ dual chamber pacemaker implantation on 04/10/14 by Dr Rayann Heman.  Since discharge, the patient reports doing very well.  He denies chest pain, palpitations, dyspnea, PND, orthopnea, nausea, vomiting, dizziness, syncope.  Echocardiogram during recent admission demonstrated EF 55-60%, mild MR, PA pressure 59.  Device History: STJ dual chamber PPM implanted 03/2014 for complete heart block by Dr Rayann Heman   Past Medical History  Diagnosis Date  . Prostate cancer 2000  . Allergic rhinitis due to pollen   . Arthritis   . Cataract   . Hyperlipidemia   . Raynauds disease   . Tinnitus of both ears   . COPD with emphysema   . CAD (coronary artery disease) 11/2008    a. Inf STEMI 11/2008 => Mid to dist LAD 70-75%, mid CFX occluded, oRCA 50%, mRCA 50%, inf AK, EF 50% => PCI: overlapping BMSs to CFX;  b.  F/u LHC 01/2009:  mid LAD 50%, mid CFX stents patent. There was 50-70% stenosis at the prox edge of the stent; IVUS of the CFX and LAD with no hemodynamically significant stenoses=>Med Rx.;  c.  ETT-Myoview 8/14:  Inf and IL scar, no ischemia, low risk  . AAA (abdominal aortic aneurysm)     a. Abdominal US 01/2011:  2.4 x 2.4 cm. Follow up recommended in 2 years.;  b. Abdominal US (01/2013):  Infrarenal AAA 2.4 x 2.4 cm  . Cancer of lung, upper lobe, Right, in  remission 2000  . Complete heart block     a. a/w syncope b. s/p STJ dual chamber pacemaker 03/2014   Past Surgical History  Procedure Laterality Date  . Angioplasty / stenting femoral    . Lung lobectomy Right 2000    Right upper lobectomy  . Transperineal implant of radiation seeds w/ ultrasound  2000    for cancer,rad.with seed plantation  . Polypectomy    . Colonoscopy  2007  . Permanent pacemaker insertion N/A 04/09/2014    Procedure: Earlean Polka WIRE;  Surgeon: Peter M Martinique, MD;  Location: [facility] CATH LAB;  Service: Cardiovascular;  Laterality: N/A;  . Permanent pacemaker insertion N/A 04/10/2014    STJ dual chamber pacemaker implanted by Dr Rayann Heman for CHB    Current Outpatient Prescriptions  Medication Sig Dispense Refill  . aspirin EC 81 MG EC tablet Take 1 tablet (81 mg total) by mouth daily. 30 tablet 0  . Fluticasone-Salmeterol (ADVAIR) 250-50 MCG/DOSE AEPB Inhale 1 puff into the lungs 2 (two) times daily. 60 each 11  . lisinopril (PRINIVIL,ZESTRIL) 2.5 MG tablet TAKE ONE TABLET BY MOUTH ONCE DAILY 90 tablet 2  . Lutein 10 MG TABS Take 10 mg by mouth 2 (two) times daily.      . metoprolol tartrate (LOPRESSOR) 25 MG tablet TAKE ONE TABLET BY MOUTH TWICE DAILY 180 tablet 2  . nitroGLYCERIN (NITROSTAT) 0.4 MG SL tablet Place 1 tablet (0.4 mg  total) under the tongue every 5 (five) minutes as needed for chest pain. 25 tablet 12  . rosuvastatin (CRESTOR) 40 MG tablet Take 0.5 tablets (20 mg total) by mouth daily. 15 tablet 11  . tiotropium (SPIRIVA HANDIHALER) 18 MCG inhalation capsule Place 1 capsule (18 mcg total) into inhaler and inhale daily. 30 capsule 12   No current facility-administered medications for this visit.    Allergies:   Amoxicillin   Social History: History   Social History  . Marital Status: Married    Spouse Name: N/A  . Number of Children: N/A  . Years of Education: N/A   Occupational History  . Not on file.   Social History Main Topics  . Smoking status:  Former Smoker -- 50 years    Types: Cigarettes    Quit date: 01/20/2007  . Smokeless tobacco: Never Used  . Alcohol Use: 6.0 oz/week    10 Cans of beer per week     Comment: moderate  . Drug Use: No  . Sexual Activity: Not on file   Other Topics Concern  . Not on file   Social History Narrative    Family History: Family History  Problem Relation Age of Onset  . Colon cancer Sister 31    rectal cancer  . Lung cancer Father 63    smoker and coal mining  . Congestive Heart Failure Mother   . CAD Brother   . Tuberculosis Brother   . Kidney failure Brother     ESRD on dialysis  . Coronary artery disease Brother     s/p CABG     Review of Systems: All other systems reviewed and are otherwise negative except as noted above.   Physical Exam: VS:  BP 118/62 mmHg  Pulse 62  Ht 5\' 11"  (1.803 m)  Wt 159 lb 3.2 oz (72.213 kg)  BMI 22.21 kg/m2 , BMI Body mass index is 22.21 kg/(m^2).  GEN- The patient is well appearing, alert and oriented x 3 today.   HEENT: normocephalic, atraumatic; sclera clear, conjunctiva pink; hearing intact; oropharynx clear; neck supple, no JVP Lymph- no cervical lymphadenopathy Lungs- Clear to ausculation bilaterally, normal work of breathing.  No wheezes, rales, rhonchi Heart- Regular rate and rhythm, no murmurs, rubs or gallops  GI- soft, non-tender, non-distended, bowel sounds present  Extremities- no clubbing, cyanosis, or edema  MS- no significant deformity or atrophy Skin- warm and dry, no rash or lesion; PPM pocket well healed Psych- euthymic mood, full affect Neuro- strength and sensation are intact  PPM Interrogation- reviewed in detail today,  See PACEART report  EKG:  EKG is not ordered today.   Recent Labs: 04/09/2014: ALT 21; Magnesium 1.8; TSH 2.833 04/10/2014: BUN 26*; Creatinine 1.51*; Hemoglobin 12.6*; Platelets 186; Potassium 4.6; Sodium 135   Wt Readings from Last 3 Encounters:  04/25/14 159 lb 3.2 oz (72.213 kg)    04/11/14 158 lb 1.1 oz (71.7 kg)  03/09/14 162 lb 14.4 oz (73.891 kg)     Other studies Reviewed: Additional studies/ records that were reviewed today include: hospital records  Assessment and Plan:  1.  Complete heart block Normal PPM function - the patient is dependent today Pacemaker pocket well healed See Pace Art report No changes today  2.  CAD No recent ischemic symptoms Continue BB, ASA, statin  3.  HTN Stable No change required today  4. AAA Due for repeat imaging 01/2015 - followed by Dr Stanford Breed   Current medicines are reviewed at length  with the patient today.   The patient does not have concerns regarding his medicines.  The following changes were made today:  none  Labs/ tests ordered today include: none     Disposition:   Follow up with Dr Rayann Heman as scheduled in 3 months    Signed, [name], NP 04/25/2014 3:16 PM  [place] [address] [place] [address] [phone] (office) [phone] (fax)

## 2014-04-25 ENCOUNTER — Encounter: Payer: Self-pay | Admitting: *Deleted

## 2014-04-25 ENCOUNTER — Ambulatory Visit (INDEPENDENT_AMBULATORY_CARE_PROVIDER_SITE_OTHER): Payer: Medicare Other | Admitting: Nurse Practitioner

## 2014-04-25 ENCOUNTER — Encounter: Payer: Self-pay | Admitting: Nurse Practitioner

## 2014-04-25 VITALS — BP 118/62 | HR 62 | Ht 71.0 in | Wt 159.2 lb

## 2014-04-25 DIAGNOSIS — I442 Atrioventricular block, complete: Secondary | ICD-10-CM

## 2014-04-25 DIAGNOSIS — I10 Essential (primary) hypertension: Secondary | ICD-10-CM

## 2014-04-25 LAB — MDC_IDC_ENUM_SESS_TYPE_INCLINIC
Brady Statistic RA Percent Paced: 1 %
Lead Channel Impedance Value: 450 Ohm
Lead Channel Impedance Value: 760 Ohm
Lead Channel Pacing Threshold Amplitude: 0.75 V
Lead Channel Pacing Threshold Pulse Width: 0.4 ms
Lead Channel Sensing Intrinsic Amplitude: 5 mV
Lead Channel Setting Pacing Amplitude: 3.5 V
Lead Channel Setting Pacing Amplitude: 3.5 V
Lead Channel Setting Pacing Pulse Width: 0.4 ms
MDC IDC MSMT LEADCHNL RV PACING THRESHOLD AMPLITUDE: 0.5 V
MDC IDC MSMT LEADCHNL RV PACING THRESHOLD PULSEWIDTH: 0.4 ms
MDC IDC PG SERIAL: 7731685
MDC IDC STAT BRADY RV PERCENT PACED: 99 %

## 2014-04-25 NOTE — Patient Instructions (Signed)
Follow up as scheduled : Wednesday 07/11/14 at 12:00 pm with Dr. Rayann Heman.  Your physician recommends that you continue on your current medications as directed. Please refer to the Current Medication list given to you today.

## 2014-05-09 ENCOUNTER — Encounter: Payer: Self-pay | Admitting: Internal Medicine

## 2014-06-13 ENCOUNTER — Other Ambulatory Visit: Payer: Self-pay | Admitting: Family Medicine

## 2014-06-13 NOTE — Telephone Encounter (Signed)
Scheduled appointments / lt

## 2014-06-13 NOTE — Telephone Encounter (Signed)
Please call and schedule CPE with fasting labs prior with Dr. Lorelei Pont sometime in the next 3 months.

## 2014-07-09 ENCOUNTER — Other Ambulatory Visit: Payer: Self-pay | Admitting: *Deleted

## 2014-07-09 ENCOUNTER — Other Ambulatory Visit: Payer: Self-pay | Admitting: Family Medicine

## 2014-07-09 MED ORDER — METOPROLOL TARTRATE 25 MG PO TABS: ORAL_TABLET | ORAL | Status: DC

## 2014-07-11 ENCOUNTER — Encounter: Payer: PRIVATE HEALTH INSURANCE | Admitting: Internal Medicine

## 2014-07-24 ENCOUNTER — Other Ambulatory Visit: Payer: Self-pay | Admitting: Family Medicine

## 2014-07-24 DIAGNOSIS — Z79899 Other long term (current) drug therapy: Secondary | ICD-10-CM

## 2014-07-24 DIAGNOSIS — C61 Malignant neoplasm of prostate: Secondary | ICD-10-CM

## 2014-07-24 DIAGNOSIS — Z125 Encounter for screening for malignant neoplasm of prostate: Secondary | ICD-10-CM

## 2014-07-25 ENCOUNTER — Other Ambulatory Visit (INDEPENDENT_AMBULATORY_CARE_PROVIDER_SITE_OTHER): Payer: Medicare Other

## 2014-07-25 DIAGNOSIS — Z125 Encounter for screening for malignant neoplasm of prostate: Secondary | ICD-10-CM

## 2014-07-25 DIAGNOSIS — Z79899 Other long term (current) drug therapy: Secondary | ICD-10-CM | POA: Diagnosis not present

## 2014-07-25 DIAGNOSIS — C61 Malignant neoplasm of prostate: Secondary | ICD-10-CM | POA: Diagnosis not present

## 2014-07-25 LAB — CBC WITH DIFFERENTIAL/PLATELET
BASOS ABS: 0 10*3/uL (ref 0.0–0.1)
BASOS PCT: 0.3 % (ref 0.0–3.0)
Eosinophils Absolute: 0.3 10*3/uL (ref 0.0–0.7)
Eosinophils Relative: 2.8 % (ref 0.0–5.0)
HEMATOCRIT: 43.4 % (ref 39.0–52.0)
Hemoglobin: 14.5 g/dL (ref 13.0–17.0)
LYMPHS ABS: 1.8 10*3/uL (ref 0.7–4.0)
LYMPHS PCT: 18.7 % (ref 12.0–46.0)
MCHC: 33.5 g/dL (ref 30.0–36.0)
MCV: 101 fl — ABNORMAL HIGH (ref 78.0–100.0)
MONOS PCT: 9.7 % (ref 3.0–12.0)
Monocytes Absolute: 1 10*3/uL (ref 0.1–1.0)
NEUTROS ABS: 6.8 10*3/uL (ref 1.4–7.7)
Neutrophils Relative %: 68.5 % (ref 43.0–77.0)
Platelets: 216 10*3/uL (ref 150.0–400.0)
RBC: 4.3 Mil/uL (ref 4.22–5.81)
RDW: 13.6 % (ref 11.5–15.5)
WBC: 9.9 10*3/uL (ref 4.0–10.5)

## 2014-07-25 LAB — BASIC METABOLIC PANEL
BUN: 16 mg/dL (ref 6–23)
CALCIUM: 9.6 mg/dL (ref 8.4–10.5)
CO2: 26 mEq/L (ref 19–32)
Chloride: 101 mEq/L (ref 96–112)
Creatinine, Ser: 0.8 mg/dL (ref 0.40–1.50)
GFR: 99.49 mL/min (ref >60.00)
Glucose, Bld: 109 mg/dL — ABNORMAL HIGH (ref 70–99)
POTASSIUM: 5.1 meq/L (ref 3.5–5.1)
Sodium: 134 mEq/L — ABNORMAL LOW (ref 135–145)

## 2014-07-25 LAB — PSA, MEDICARE: PSA: 0.02 ng/ml — ABNORMAL LOW (ref 0.10–4.00)

## 2014-07-30 ENCOUNTER — Ambulatory Visit (INDEPENDENT_AMBULATORY_CARE_PROVIDER_SITE_OTHER): Payer: Medicare Other | Admitting: Family Medicine

## 2014-07-30 ENCOUNTER — Encounter: Payer: Self-pay | Admitting: Family Medicine

## 2014-07-30 VITALS — BP 120/60 | HR 71 | Temp 97.5°F | Ht 70.25 in | Wt 151.2 lb

## 2014-07-30 DIAGNOSIS — I2109 ST elevation (STEMI) myocardial infarction involving other coronary artery of anterior wall: Secondary | ICD-10-CM | POA: Diagnosis not present

## 2014-07-30 DIAGNOSIS — Z0000 Encounter for general adult medical examination without abnormal findings: Secondary | ICD-10-CM

## 2014-07-30 DIAGNOSIS — C3411 Malignant neoplasm of upper lobe, right bronchus or lung: Secondary | ICD-10-CM | POA: Diagnosis not present

## 2014-07-30 DIAGNOSIS — C44612 Basal cell carcinoma of skin of right upper limb, including shoulder: Secondary | ICD-10-CM | POA: Diagnosis not present

## 2014-07-30 DIAGNOSIS — I2129 ST elevation (STEMI) myocardial infarction involving other sites: Secondary | ICD-10-CM

## 2014-07-30 DIAGNOSIS — C61 Malignant neoplasm of prostate: Secondary | ICD-10-CM

## 2014-07-30 DIAGNOSIS — J439 Emphysema, unspecified: Secondary | ICD-10-CM

## 2014-07-30 NOTE — Progress Notes (Signed)
Dr. Frederico Hamman T. Skyanne Welle, MD, [facility] [place], [address] Phone: [phone] Fax: [phone]  07/30/2014  Patient: Patrick Sampson, MRN: [id number], DOB: 1934-05-03, 80 y.o.  Primary Physician:  Owens Loffler, MD  Chief Complaint: Annual Exam  Subjective:   Patrick Sampson is a 80 y.o. pleasant patient who presents for a medicare wellness examination:  Preventative Health Maintenance Visit:  Health Maintenance Summary Reviewed and updated, unless pt declines services.  Tobacco History Reviewed. Alcohol: No concerns, no excessive use Exercise Habits: Some activity, rec at least 30 mins 5 times a week STD concerns: no risk or activity to increase risk Drug Use: None Encouraged self-testicular check  Pacemaker for CHB. 03/2014, the patient was admitted to the hospital in complete heart block, and he was ultimately given a pacemaker.  Now he is doing well.  Wife just had knee operation. Little bit more stressful at home, given that he is having to take care of her after her total knee arthroplasty.  R BCC vs SCC. He also has a place on his right arm that is been.  Crusting and changing for at least 2 years   Health Maintenance  Topic Date Due  . INFLUENZA VACCINE  08/20/2014  . TETANUS/TDAP  07/10/2017  . ZOSTAVAX  Completed  . PNA vac Low Risk Adult  Completed    Immunization History  Administered Date(s) Administered  . Influenza Split 10/14/2012  . Influenza Whole 01/19/2005  . Influenza-Unspecified 10/11/2013  . Pneumococcal Conjugate-13 03/01/2013  . Pneumococcal Polysaccharide-23 01/19/2002, 07/20/2008  . Td 01/19/1997, 07/11/2007  . Zoster 10/20/2006    Patient Active Problem List   Diagnosis Date Noted  . Complete heart block 04/09/2014    Priority: High  . STEMI, s/p PCI 11/2008 12/02/2012    Priority: High  . moderate COPD with emphysema 02/24/2012    Priority: High  . CAD S/P CFX BMS  2010 12/19/2008    Priority: High  . Cancer of lung, upper lobe, Right, in remission 08/16/2006    Priority: High  . Prostate cancer, in Remission, 12/1998 Seeds 08/16/2006    Priority: High  . Raynauds disease     Priority: Medium  . Essential hypertension 02/12/2010    Priority: Medium  . HYPERLIPIDEMIA-MIXED 12/19/2008    Priority: Medium  . PERIPHERAL VASCULAR DISEASE 08/16/2006    Priority: Medium  . RBBB 04/09/2014  . Aortic calcification 12/02/2012  . Hearing loss 12/02/2012  . Allergic rhinitis due to pollen   . Tinnitus of both ears   . HEMORRHOIDS 03/19/2010  . COLONIC POLYPS, ADENOMATOUS, HX OF 03/19/2010  . ABDOMINAL BRUIT 01/25/2009  . Tobacco abuse, in remission 12/19/2008  . AAA- 2.4 x 2/4 07/11/2007   Past Medical History  Diagnosis Date  . Prostate cancer 2000  . Allergic rhinitis due to pollen   . Arthritis   . Cataract   . Hyperlipidemia   . Raynauds disease   . Tinnitus of both ears   . COPD with emphysema   . CAD (coronary artery disease) 11/2008    a. Inf STEMI 11/2008 => Mid to dist LAD 70-75%, mid CFX occluded, oRCA 50%, mRCA 50%, inf AK, EF 50% => PCI: overlapping BMSs to CFX;  b.  F/u LHC 01/2009:  mid LAD 50%, mid CFX stents patent. There was 50-70% stenosis at the prox edge of the stent; IVUS of the CFX and LAD with no hemodynamically significant stenoses=>Med Rx.;  c.  ETT-Myoview 8/14:  Inf and IL scar, no ischemia, low risk  . AAA (abdominal aortic aneurysm)     a. Abdominal US 01/2011:  2.4 x 2.4 cm. Follow up recommended in 2 years.;  b. Abdominal US (01/2013):  Infrarenal AAA 2.4 x 2.4 cm  . Cancer of lung, upper lobe, Right, in remission 2000  . Complete heart block     a. a/w syncope b. s/p STJ dual chamber pacemaker 03/2014   Past Surgical History  Procedure Laterality Date  . Angioplasty / stenting femoral    . Lung lobectomy Right 2000    Right upper lobectomy  . Transperineal implant of radiation seeds w/ ultrasound  2000    for  cancer,rad.with seed plantation  . Polypectomy    . Colonoscopy  2007  . Permanent pacemaker insertion N/A 04/09/2014    Procedure: Earlean Polka WIRE;  Surgeon: Peter M Martinique, MD;  Location: [facility] CATH LAB;  Service: Cardiovascular;  Laterality: N/A;  . Permanent pacemaker insertion N/A 04/10/2014    STJ dual chamber pacemaker implanted by Dr Rayann Heman for CHB  . Cataract extraction, bilateral     History   Social History  . Marital Status: Married    Spouse Name: N/A  . Number of Children: N/A  . Years of Education: N/A   Occupational History  . Not on file.   Social History Main Topics  . Smoking status: Former Smoker -- 50 years    Types: Cigarettes    Quit date: 01/20/2007  . Smokeless tobacco: Never Used  . Alcohol Use: 6.0 oz/week    10 Cans of beer per week     Comment: moderate  . Drug Use: No  . Sexual Activity: Not on file   Other Topics Concern  . Not on file   Social History Narrative   Family History  Problem Relation Age of Onset  . Colon cancer Sister 33    rectal cancer  . Lung cancer Father 36    smoker and coal mining  . Congestive Heart Failure Mother   . CAD Brother   . Tuberculosis Brother   . Kidney failure Brother     ESRD on dialysis  . Coronary artery disease Brother     s/p CABG   Allergies  Allergen Reactions  . Amoxicillin Rash    Medication list has been reviewed and updated.   General: Denies fever, chills, sweats. No significant weight loss. Eyes: Denies blurring,significant itching ENT: Denies earache, sore throat, and hoarseness. Cardiovascular: as above Respiratory: Denies cough, dyspnea at rest,wheeezing Breast: no concerns about lumps GI: Denies nausea, vomiting, diarrhea, constipation, change in bowel habits, abdominal pain, melena, hematochezia GU: Denies penile discharge, ED, urinary flow / outflow problems. No STD concerns. Musculoskeletal: Denies back pain, joint pain Derm: as above Neuro: Denies  paresthesias, frequent  falls, frequent headaches Psych: Denies depression, anxiety Endocrine: Denies cold intolerance, heat intolerance, polydipsia Heme: Denies enlarged lymph nodes Allergy: No hayfever  Objective:   BP 120/60 mmHg  Pulse 71  Temp(Src) 97.5 F (36.4 C) (Oral)  Ht 5' 10.25" (1.784 m)  Wt 151 lb 4 oz (68.607 kg)  BMI 21.56 kg/m2  The patient completed a fall screen and PHQ-2 and PHQ-9 if necessary, which is documented in the EHR. The CMA/LPN/RN who assisted the patient verbally completed with them and documented results in [place].   Hearing Screening   Method: Audiometry   _0  _1  _2  _3  _4  _5  _6   Right  ear:   40 40 40 0   Left ear:   40 40 40 0   Vision Screening Comments: Wears Glasses-Seen by Dr. Gershon Crane 10/18/2013  GEN: well developed, well nourished, no acute distress Eyes: conjunctiva and lids normal, PERRLA, EOMI ENT: TM clear, nares clear, oral exam WNL Neck: supple, no lymphadenopathy, no thyromegaly, no JVD Pulm: clear to auscultation and percussion, respiratory effort normal CV: regular rate and rhythm, S1-S2, no murmur, rub or gallop, no bruits, peripheral pulses normal and symmetric, no cyanosis, clubbing, edema or varicosities GI: soft, non-tender; no hepatosplenomegaly, masses; active bowel sounds all quadrants GU: no hernia, testicular mass, penile discharge Lymph: no cervical, axillary or inguinal adenopathy MSK: gait normal, muscle tone and strength WNL, no joint swelling, effusions, discoloration, crepitus  SKIN: on the patient's right arm there is a area approximately the size of a quarter that is somewhat elevated, pearly, with some ulceration and signs of healing. Neuro: normal mental status, normal strength, sensation, and motion Psych: alert; oriented to person, place and time, normally interactive and not anxious or depressed in appearance.  All labs reviewed with patient.  Lipids:    Component Value Date/Time   CHOL 104  04/10/2014 0126   TRIG 126 04/10/2014 0126   HDL 32* 04/10/2014 0126   LDLDIRECT 50.8 08/14/2009 1057   VLDL 25 04/10/2014 0126   CHOLHDL 3.3 04/10/2014 0126   CBC: CBC Latest Ref Rng 07/25/2014 04/10/2014 04/09/2014  WBC 4.0 - 10.5 K/uL 9.9 12.8(H) 16.4(H)  Hemoglobin 13.0 - 17.0 g/dL 14.5 12.6(L) 14.0  Hematocrit 39.0 - 52.0 % 43.4 38.2(L) 41.8  Platelets 150.0 - 400.0 K/uL 216.0 186 326    Basic Metabolic Panel:    Component Value Date/Time   NA 134* 07/25/2014 0858   K 5.1 07/25/2014 0858   CL 101 07/25/2014 0858   CO2 26 07/25/2014 0858   BUN 16 07/25/2014 0858   CREATININE 0.80 07/25/2014 0858   CREATININE 0.89 11/22/2013 0845   GLUCOSE 109* 07/25/2014 0858   CALCIUM 9.6 07/25/2014 0858   Hepatic Function Latest Ref Rng 04/09/2014 11/22/2013 02/22/2013  Total Protein 6.0 - 8.3 g/dL 7.3 7.3 6.9  Albumin 3.5 - 5.2 g/dL 3.9 4.3 3.6  AST 0 - 37 U/L _0 ALT 0 - 53 U/L _1 Alk Phosphatase 39 - 117 U/L 87 85 76  Total Bilirubin 0.3 - 1.2 mg/dL 0.8 0.6 0.9  Bilirubin, Direct 0.0 - 0.3 mg/dL - - 0.2    Lab Results  Component Value Date   TSH 2.833 04/09/2014   Lab Results  Component Value Date   PSA 0.02* 07/25/2014   PSA 0.02* 02/22/2013   PSA 0.01* 02/19/2012    Assessment and Plan:   Healthcare maintenance  Basal cell carcinoma, arm, right - Plan: Ambulatory referral to Dermatology  Malignant neoplasm of upper lobe of right lung  Prostate cancer, in Remission, 12/1998 Seeds  ST elevation myocardial infarction (STEMI) involving other coronary artery  Pulmonary emphysema, unspecified emphysema type  Health Maintenance Exam: The patient's preventative maintenance and recommended screening tests for an annual wellness exam were reviewed in full today. Brought up to date unless services declined.  Counselled on the importance of diet, exercise, and its role in overall health and mortality. The patient's FH and SH was reviewed, including their home  life, tobacco status, and drug and alcohol status.  I have personally reviewed the Medicare Annual Wellness questionnaire and have noted 1. The patient's medical and  social history 2. Their use of alcohol, tobacco or illicit drugs 3. Their current medications and supplements 4. The patient's functional ability including ADL's, fall risks, home safety risks and hearing or visual             impairment. 5. Diet and physical activities 6. Evidence for depression or mood disorders 7. Reviewed Updated provider list, see scanned forms and CHL Snapshot.   The patients weight, height, BMI and visual acuity have been recorded in the chart I have made referrals, counseling and provided education to the patient based review of the above and I have provided the pt with a written personalized care plan for preventive services.  I have provided the patient with a copy of your personalized plan for preventive services. Instructed to take the time to review along with their updated medication list.  Overall, doing well given the fact that he recently had to be admitted for heart block and got a pacemaker. High level of concern for basal cell carcinoma versus squamous cell carcinoma on the right arm.  Consult dermatology.  Follow-up: No Follow-up on file. Or follow-up in 1 year for complete physical examination  New Prescriptions   No medications on file   Orders Placed This Encounter  Procedures  . Ambulatory referral to Dermatology    Signed,  Frederico Hamman T. Josiel Gahm, MD   Patient's Medications  New Prescriptions   No medications on file  Previous Medications   ADVAIR DISKUS 250-50 MCG/DOSE AEPB    INHALE ONE DOSE INTO LUNGS TWICE DAILY.   ASPIRIN EC 81 MG EC TABLET    Take 1 tablet (81 mg total) by mouth daily.   LISINOPRIL (PRINIVIL,ZESTRIL) 2.5 MG TABLET    TAKE ONE TABLET BY MOUTH ONCE DAILY   LUTEIN 10 MG TABS    Take 10 mg by mouth 2 (two) times daily.     METOPROLOL TARTRATE  (LOPRESSOR) 25 MG TABLET    TAKE ONE TABLET BY MOUTH TWICE DAILY   NITROGLYCERIN (NITROSTAT) 0.4 MG SL TABLET    Place 1 tablet (0.4 mg total) under the tongue every 5 (five) minutes as needed for chest pain.   ROSUVASTATIN (CRESTOR) 40 MG TABLET    Take 0.5 tablets (20 mg total) by mouth daily.   SPIRIVA HANDIHALER 18 MCG INHALATION CAPSULE    PLACE ONE CAPSULE (18 MCG TOTAL) INTO INHALER AND INHALE DAILY.  Modified Medications   No medications on file  Discontinued Medications   No medications on file

## 2014-07-30 NOTE — Patient Instructions (Signed)

## 2014-07-30 NOTE — Progress Notes (Signed)
Pre visit review using our clinic review tool, if applicable. No additional management support is needed unless otherwise documented below in the visit note. 

## 2014-08-06 ENCOUNTER — Other Ambulatory Visit: Payer: Self-pay

## 2014-08-06 MED ORDER — LISINOPRIL 2.5 MG PO TABS: ORAL_TABLET | ORAL | Status: DC

## 2014-08-07 ENCOUNTER — Other Ambulatory Visit: Payer: Self-pay | Admitting: Dermatology

## 2014-08-07 DIAGNOSIS — C44612 Basal cell carcinoma of skin of right upper limb, including shoulder: Secondary | ICD-10-CM | POA: Diagnosis not present

## 2014-09-03 ENCOUNTER — Ambulatory Visit (INDEPENDENT_AMBULATORY_CARE_PROVIDER_SITE_OTHER): Payer: Medicare Other | Admitting: Internal Medicine

## 2014-09-03 ENCOUNTER — Encounter: Payer: Self-pay | Admitting: Internal Medicine

## 2014-09-03 VITALS — BP 132/76 | HR 71 | Ht 71.0 in | Wt 151.6 lb

## 2014-09-03 DIAGNOSIS — Z9861 Coronary angioplasty status: Secondary | ICD-10-CM

## 2014-09-03 DIAGNOSIS — I2510 Atherosclerotic heart disease of native coronary artery without angina pectoris: Secondary | ICD-10-CM | POA: Diagnosis not present

## 2014-09-03 DIAGNOSIS — I442 Atrioventricular block, complete: Secondary | ICD-10-CM | POA: Diagnosis not present

## 2014-09-03 LAB — CUP PACEART INCLINIC DEVICE CHECK
Battery Voltage: 3.01 V
Brady Statistic RA Percent Paced: 0.14 %
Lead Channel Impedance Value: 462.5 Ohm
Lead Channel Impedance Value: 737.5 Ohm
Lead Channel Pacing Threshold Amplitude: 1 V
Lead Channel Pacing Threshold Pulse Width: 0.4 ms
Lead Channel Setting Pacing Amplitude: 0.875
Lead Channel Setting Pacing Pulse Width: 0.4 ms
Lead Channel Setting Sensing Sensitivity: 2.5 mV
MDC IDC MSMT BATTERY REMAINING LONGEVITY: 132 mo
MDC IDC MSMT LEADCHNL RA SENSING INTR AMPL: 3.4 mV
MDC IDC MSMT LEADCHNL RV PACING THRESHOLD AMPLITUDE: 0.625 V
MDC IDC MSMT LEADCHNL RV PACING THRESHOLD PULSEWIDTH: 0.4 ms
MDC IDC PG SERIAL: 7731685
MDC IDC SESS DTM: 20160815131551
MDC IDC SET LEADCHNL RA PACING AMPLITUDE: 2 V
MDC IDC STAT BRADY RV PERCENT PACED: 99.97 %
Pulse Gen Model: 2240

## 2014-09-03 NOTE — Patient Instructions (Signed)
Medication Instructions:  Your physician recommends that you continue on your current medications as directed. Please refer to the Current Medication list given to you today.   Labwork: None ordered  Testing/Procedures: None ordered  Follow-Up: Your physician wants you to follow-up in: 12 months with [name], NP You will receive a reminder letter in the mail two months in advance. If you don't receive a letter, please call our office to schedule the follow-up appointment.  Remote monitoring is used to monitor your Pacemaker  from home. This monitoring reduces the number of office visits required to check your device to one time per year. It allows [place] to keep an eye on the functioning of your device to ensure it is working properly. You are scheduled for a device check from home on 12/03/14. You may send your transmission at any time that day. If you have a wireless device, the transmission will be sent automatically. After your physician reviews your transmission, you will receive a postcard with your next transmission date.     Any Other Special Instructions Will Be Listed Below (If Applicable).

## 2014-09-03 NOTE — Progress Notes (Signed)
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 male who presents today for routine electrophysiology followup.  Since his pacemaker implant, the patient reports doing very well.  Today, he denies symptoms of palpitations, chest pain, shortness of breath,  lower extremity edema, dizziness, presyncope, or syncope.  The patient is otherwise without complaint today.   Past Medical History  Diagnosis Date  . Prostate cancer 2000  . Allergic rhinitis due to pollen   . Arthritis   . Cataract   . Hyperlipidemia   . Raynauds disease   . Tinnitus of both ears   . COPD with emphysema   . CAD (coronary artery disease) 11/2008    a. Inf STEMI 11/2008 => Mid to dist LAD 70-75%, mid CFX occluded, oRCA 50%, mRCA 50%, inf AK, EF 50% => PCI: overlapping BMSs to CFX;  b.  F/u LHC 01/2009:  mid LAD 50%, mid CFX stents patent. There was 50-70% stenosis at the prox edge of the stent; IVUS of the CFX and LAD with no hemodynamically significant stenoses=>Med Rx.;  c.  ETT-Myoview 8/14:  Inf and IL scar, no ischemia, low risk  . AAA (abdominal aortic aneurysm)     a. Abdominal US 01/2011:  2.4 x 2.4 cm. Follow up recommended in 2 years.;  b. Abdominal US (01/2013):  Infrarenal AAA 2.4 x 2.4 cm  . Cancer of lung, upper lobe, Right, in remission 2000  . Complete heart block     a. a/w syncope b. s/p STJ dual chamber pacemaker 03/2014   Past Surgical History  Procedure Laterality Date  . Angioplasty / stenting femoral    . Lung lobectomy Right 2000    Right upper lobectomy  . Transperineal implant of radiation seeds w/ ultrasound  2000    for cancer,rad.with seed plantation  . Polypectomy    . Colonoscopy  2007  . Permanent pacemaker insertion N/A 04/09/2014    Procedure: Earlean Polka WIRE;  Surgeon: Peter M Martinique, MD;  Location: [facility] CATH LAB;  Service: Cardiovascular;  Laterality: N/A;  . Permanent pacemaker insertion N/A 04/10/2014    STJ dual chamber pacemaker implanted by Dr  Rayann Heman for CHB  . Cataract extraction, bilateral      ROS- all systems are reviewed and negative except as per HPI above  Current Outpatient Prescriptions  Medication Sig Dispense Refill  . ADVAIR DISKUS 250-50 MCG/DOSE AEPB INHALE ONE DOSE INTO LUNGS TWICE DAILY. (Patient taking differently: INHALE ONE DOSE INTO LUNGS TWICE DAILY) 60 each 1  . aspirin EC 81 MG EC tablet Take 1 tablet (81 mg total) by mouth daily. 30 tablet 0  . lisinopril (PRINIVIL,ZESTRIL) 2.5 MG tablet TAKE ONE TABLET BY MOUTH ONCE DAILY 90 tablet 2  . Lutein 10 MG TABS Take 10 mg by mouth 2 (two) times daily.      . metoprolol tartrate (LOPRESSOR) 25 MG tablet TAKE ONE TABLET BY MOUTH TWICE DAILY 180 tablet 2  . nitroGLYCERIN (NITROSTAT) 0.4 MG SL tablet Place 1 tablet (0.4 mg total) under the tongue every 5 (five) minutes as needed for chest pain. 25 tablet 12  . rosuvastatin (CRESTOR) 40 MG tablet Take 0.5 tablets (20 mg total) by mouth daily. 15 tablet 11  . SPIRIVA HANDIHALER 18 MCG inhalation capsule PLACE ONE CAPSULE (18 MCG TOTAL) INTO INHALER AND INHALE DAILY. 30 capsule 2   No current facility-administered medications for this visit.    Physical Exam: Filed Vitals:   09/03/14 2979  BP: 132/76  Pulse: 71  Height: '5\' 11"'$  (1.803 m)  Weight: 68.765 kg (151 lb 9.6 oz)    GEN- The patient is well appearing, alert and oriented x 3 today.   Head- normocephalic, atraumatic Eyes-  Sclera clear, conjunctiva pink Ears- hearing intact Oropharynx- clear Lungs- Clear to ausculation bilaterally, normal work of breathing Chest- pacemaker pocket is well healed Heart- Regular rate and rhythm, no murmurs, rubs or gallops, PMI not laterally displaced GI- soft, NT, ND, + BS Extremities- no clubbing, cyanosis, or edema  Pacemaker interrogation- reviewed in detail today,  See PACEART report  Assessment and Plan:  1. Complete heart block Normal pacemaker function See Pace Art report No changes today  2. CAD No  ischemic symptoms No changes today  Merlin Return to see EP NP in 1 year Follow-up with Dr Stanford Breed as scheduled

## 2014-09-12 ENCOUNTER — Other Ambulatory Visit: Payer: Self-pay | Admitting: Family Medicine

## 2014-09-13 DIAGNOSIS — Z08 Encounter for follow-up examination after completed treatment for malignant neoplasm: Secondary | ICD-10-CM | POA: Diagnosis not present

## 2014-09-13 DIAGNOSIS — Z85828 Personal history of other malignant neoplasm of skin: Secondary | ICD-10-CM | POA: Diagnosis not present

## 2014-09-26 ENCOUNTER — Encounter: Payer: Self-pay | Admitting: Family Medicine

## 2014-09-26 DIAGNOSIS — Z23 Encounter for immunization: Secondary | ICD-10-CM | POA: Diagnosis not present

## 2014-10-15 ENCOUNTER — Other Ambulatory Visit: Payer: Self-pay | Admitting: Family Medicine

## 2014-12-03 ENCOUNTER — Ambulatory Visit (INDEPENDENT_AMBULATORY_CARE_PROVIDER_SITE_OTHER): Payer: Medicare Other | Admitting: *Deleted

## 2014-12-03 DIAGNOSIS — I442 Atrioventricular block, complete: Secondary | ICD-10-CM

## 2014-12-03 NOTE — Progress Notes (Signed)
Remote pacemaker transmission.   

## 2014-12-12 ENCOUNTER — Encounter: Payer: Self-pay | Admitting: Gastroenterology

## 2014-12-12 LAB — CUP PACEART REMOTE DEVICE CHECK
Battery Voltage: 3.01 V
Brady Statistic AP VP Percent: 1 %
Brady Statistic AS VP Percent: 99 %
Brady Statistic RA Percent Paced: 1 %
Implantable Lead Implant Date: 20160322
Implantable Lead Location: 753859
Implantable Lead Location: 753860
Implantable Lead Model: 1948
Lead Channel Impedance Value: 730 Ohm
Lead Channel Pacing Threshold Amplitude: 1 V
Lead Channel Pacing Threshold Pulse Width: 0.4 ms
Lead Channel Sensing Intrinsic Amplitude: 12 mV
Lead Channel Setting Pacing Amplitude: 2 V
MDC IDC LEAD IMPLANT DT: 20160322
MDC IDC MSMT BATTERY REMAINING LONGEVITY: 124 mo
MDC IDC MSMT BATTERY REMAINING PERCENTAGE: 95.5 %
MDC IDC MSMT LEADCHNL RA IMPEDANCE VALUE: 400 Ohm
MDC IDC MSMT LEADCHNL RA SENSING INTR AMPL: 3.4 mV
MDC IDC MSMT LEADCHNL RV PACING THRESHOLD AMPLITUDE: 0.625 V
MDC IDC MSMT LEADCHNL RV PACING THRESHOLD PULSEWIDTH: 0.4 ms
MDC IDC PG SERIAL: 7731685
MDC IDC SESS DTM: 20161114070014
MDC IDC SET LEADCHNL RV PACING AMPLITUDE: 0.875
MDC IDC SET LEADCHNL RV PACING PULSEWIDTH: 0.4 ms
MDC IDC SET LEADCHNL RV SENSING SENSITIVITY: 2.5 mV
MDC IDC STAT BRADY AP VS PERCENT: 1 %
MDC IDC STAT BRADY AS VS PERCENT: 1 %
MDC IDC STAT BRADY RV PERCENT PACED: 99 %

## 2014-12-17 DIAGNOSIS — Z85828 Personal history of other malignant neoplasm of skin: Secondary | ICD-10-CM | POA: Diagnosis not present

## 2014-12-18 ENCOUNTER — Encounter: Payer: Self-pay | Admitting: Cardiology

## 2015-01-01 ENCOUNTER — Encounter: Payer: Self-pay | Admitting: Cardiology

## 2015-01-09 ENCOUNTER — Other Ambulatory Visit: Payer: Self-pay | Admitting: Cardiology

## 2015-01-09 NOTE — Telephone Encounter (Signed)
Rx(s) sent to pharmacy electronically.  

## 2015-01-15 ENCOUNTER — Other Ambulatory Visit: Payer: Self-pay | Admitting: Cardiology

## 2015-01-15 DIAGNOSIS — I714 Abdominal aortic aneurysm, without rupture, unspecified: Secondary | ICD-10-CM

## 2015-01-28 ENCOUNTER — Inpatient Hospital Stay (HOSPITAL_COMMUNITY): Admission: RE | Admit: 2015-01-28 | Payer: Medicare Other | Source: Ambulatory Visit

## 2015-02-01 ENCOUNTER — Ambulatory Visit (HOSPITAL_COMMUNITY)
Admission: RE | Admit: 2015-02-01 | Discharge: 2015-02-01 | Disposition: A | Discharge status: Home / Self Care | Payer: Medicare Other | Source: Ambulatory Visit | Attending: Cardiology | Admitting: Cardiology

## 2015-02-01 DIAGNOSIS — I714 Abdominal aortic aneurysm, without rupture, unspecified: Secondary | ICD-10-CM

## 2015-02-01 DIAGNOSIS — E785 Hyperlipidemia, unspecified: Secondary | ICD-10-CM | POA: Insufficient documentation

## 2015-02-01 DIAGNOSIS — I700 Atherosclerosis of aorta: Secondary | ICD-10-CM | POA: Diagnosis not present

## 2015-03-04 ENCOUNTER — Ambulatory Visit (INDEPENDENT_AMBULATORY_CARE_PROVIDER_SITE_OTHER): Payer: Medicare Other | Admitting: *Deleted

## 2015-03-04 DIAGNOSIS — I442 Atrioventricular block, complete: Secondary | ICD-10-CM | POA: Diagnosis not present

## 2015-03-04 NOTE — Progress Notes (Signed)
Remote pacemaker transmission.   

## 2015-03-06 ENCOUNTER — Encounter: Payer: Self-pay | Admitting: Cardiology

## 2015-03-06 LAB — CUP PACEART REMOTE DEVICE CHECK
Battery Remaining Longevity: 124 mo
Battery Remaining Percentage: 95.5 %
Battery Voltage: 3.01 V
Brady Statistic RA Percent Paced: 1 %
Brady Statistic RV Percent Paced: 99 %
Implantable Lead Implant Date: 20160322
Implantable Lead Location: 753860
Implantable Lead Model: 1948
Lead Channel Impedance Value: 430 Ohm
Lead Channel Sensing Intrinsic Amplitude: 3.9 mV
Lead Channel Setting Pacing Amplitude: 0.875
Lead Channel Setting Pacing Pulse Width: 0.4 ms
Lead Channel Setting Sensing Sensitivity: 2.5 mV
MDC IDC LEAD IMPLANT DT: 20160322
MDC IDC LEAD LOCATION: 753859
MDC IDC MSMT LEADCHNL RV IMPEDANCE VALUE: 740 Ohm
MDC IDC MSMT LEADCHNL RV PACING THRESHOLD AMPLITUDE: 0.625 V
MDC IDC MSMT LEADCHNL RV PACING THRESHOLD PULSEWIDTH: 0.4 ms
MDC IDC SESS DTM: 20170213070013
MDC IDC SET LEADCHNL RA PACING AMPLITUDE: 2 V
MDC IDC STAT BRADY AP VP PERCENT: 1 %
MDC IDC STAT BRADY AP VS PERCENT: 1 %
MDC IDC STAT BRADY AS VP PERCENT: 99 %
MDC IDC STAT BRADY AS VS PERCENT: 1 %
Pulse Gen Model: 2240
Pulse Gen Serial Number: 7731685

## 2015-03-11 ENCOUNTER — Other Ambulatory Visit: Payer: Self-pay | Admitting: Family Medicine

## 2015-03-13 NOTE — Progress Notes (Signed)
HPI: FU CAD, HTN, HL, AAA. He presented to the hospital in 11/10 with an inferior STEMI. LHC 11/2008: Mid to distal LAD 70-75%, mid CFX occluded, ostial RCA 50%, mid RCA 50%, inferior AK, EF 50%. He underwent overlapping bare-metal stents to the CFX at that time. Follow up [facility] 01/2009 demonstrated mid LAD 50%, mid CFX stents patent. There was 50-70% stenosis at the proximal edge of the stent. IVUS of the CFX and LAD demonstrated no hemodynamically significant stenoses. Continued medical therapy was recommended. Nuclear study 8/14 showed inferior scar and no ischemia. Echocardiogram March 2016 showed normal LV function and mild mitral regurgitation. Patient had complete heart block at that time and had pacemaker inserted. Abdominal US 1/17: 2.3 x 2.4 cm. Since last seen, the patient has dyspnea with more extreme activities but not with routine activities. It is relieved with rest. It is not associated with chest pain. There is no orthopnea, PND or pedal edema. There is no syncope or palpitations. There is no exertional chest pain.   Current Outpatient Prescriptions  Medication Sig Dispense Refill  . ADVAIR DISKUS 250-50 MCG/DOSE AEPB INHALE ONE PUFF INTO LUNGS TWICE DAILY 60 each 11  . aspirin EC 81 MG EC tablet Take 1 tablet (81 mg total) by mouth daily. 30 tablet 0  . CRESTOR 40 MG tablet TAKE ONE-HALF TABLET BY MOUTH ONCE DAILY 15 tablet 6  . lisinopril (PRINIVIL,ZESTRIL) 2.5 MG tablet TAKE ONE TABLET BY MOUTH ONCE DAILY 90 tablet 2  . Lutein 10 MG TABS Take 10 mg by mouth 2 (two) times daily.      . metoprolol tartrate (LOPRESSOR) 25 MG tablet TAKE ONE TABLET BY MOUTH TWICE DAILY 180 tablet 2  . nitroGLYCERIN (NITROSTAT) 0.4 MG SL tablet Place 1 tablet (0.4 mg total) under the tongue every 5 (five) minutes as needed for chest pain. 25 tablet 12  . SPIRIVA HANDIHALER 18 MCG inhalation capsule INHALE ONE DOSE BY MOUTH ONCE DAILY 30 capsule 5   No current facility-administered medications for  this visit.     Past Medical History  Diagnosis Date  . Prostate cancer ([place]) 2000  . Allergic rhinitis due to pollen   . Arthritis   . Cataract   . Hyperlipidemia   . Raynauds disease   . Tinnitus of both ears   . COPD with emphysema ([place])   . CAD (coronary artery disease) 11/2008    a. Inf STEMI 11/2008 => Mid to dist LAD 70-75%, mid CFX occluded, oRCA 50%, mRCA 50%, inf AK, EF 50% => PCI: overlapping BMSs to CFX;  b.  F/u LHC 01/2009:  mid LAD 50%, mid CFX stents patent. There was 50-70% stenosis at the prox edge of the stent; IVUS of the CFX and LAD with no hemodynamically significant stenoses=>Med Rx.;  c.  ETT-Myoview 8/14:  Inf and IL scar, no ischemia, low risk  . AAA (abdominal aortic aneurysm) ([place])     a. Abdominal US 01/2011:  2.4 x 2.4 cm. Follow up recommended in 2 years.;  b. Abdominal US (01/2013):  Infrarenal AAA 2.4 x 2.4 cm  . Cancer of lung, upper lobe, Right, in remission 2000  . Complete heart block ([facility])     a. a/w syncope b. s/p STJ dual chamber pacemaker 03/2014    Past Surgical History  Procedure Laterality Date  . Angioplasty / stenting femoral    . Lung lobectomy Right 2000    Right upper lobectomy  . Transperineal implant of radiation  seeds w/ ultrasound  2000    for cancer,rad.with seed plantation  . Polypectomy    . Colonoscopy  2007  . Permanent pacemaker insertion N/A 04/09/2014    Procedure: Earlean Polka WIRE;  Surgeon: Peter M Martinique, MD;  Location: [facility] CATH LAB;  Service: Cardiovascular;  Laterality: N/A;  . Permanent pacemaker insertion N/A 04/10/2014    STJ dual chamber pacemaker implanted by Dr Rayann Heman for CHB  . Cataract extraction, bilateral      Social History   Social History  . Marital Status: Married    Spouse Name: N/A  . Number of Children: N/A  . Years of Education: N/A   Occupational History  . Not on file.   Social History Main Topics  . Smoking status: Former Smoker -- 50 years    Types: Cigarettes    Quit date: 01/20/2007  .  Smokeless tobacco: Never Used  . Alcohol Use: 6.0 oz/week    10 Cans of beer per week     Comment: moderate  . Drug Use: No  . Sexual Activity: Not on file   Other Topics Concern  . Not on file   Social History Narrative    Family History  Problem Relation Age of Onset  . Colon cancer Sister 3    rectal cancer  . Lung cancer Father 71    smoker and coal mining  . Congestive Heart Failure Mother   . CAD Brother   . Tuberculosis Brother   . Kidney failure Brother     ESRD on dialysis  . Coronary artery disease Brother     s/p CABG    ROS: no fevers or chills, productive cough, hemoptysis, dysphasia, odynophagia, melena, hematochezia, dysuria, hematuria, rash, seizure activity, orthopnea, PND, pedal edema, claudication. Remaining systems are negative.  Physical Exam: Well-developed well-nourished in no acute distress.  Skin is warm and dry.  HEENT is normal.  Neck is supple.  Chest with diminished BS throughout Cardiovascular exam is regular rate and rhythm.  Abdominal exam nontender or distended. No masses palpated. Extremities show no edema. Hands are erythematous from Raynaud's neuro grossly intact  ECG Sinus rhythm with ventricular pacing and occasional PVC.

## 2015-03-15 ENCOUNTER — Encounter: Payer: Self-pay | Admitting: Cardiology

## 2015-03-15 ENCOUNTER — Ambulatory Visit (INDEPENDENT_AMBULATORY_CARE_PROVIDER_SITE_OTHER): Payer: Medicare Other | Admitting: Cardiology

## 2015-03-15 VITALS — BP 112/70 | HR 97 | Ht 71.0 in | Wt 151.0 lb

## 2015-03-15 DIAGNOSIS — Z950 Presence of cardiac pacemaker: Secondary | ICD-10-CM

## 2015-03-15 DIAGNOSIS — I2510 Atherosclerotic heart disease of native coronary artery without angina pectoris: Secondary | ICD-10-CM | POA: Diagnosis not present

## 2015-03-15 DIAGNOSIS — I10 Essential (primary) hypertension: Secondary | ICD-10-CM

## 2015-03-15 DIAGNOSIS — Z9861 Coronary angioplasty status: Secondary | ICD-10-CM | POA: Diagnosis not present

## 2015-03-15 NOTE — Assessment & Plan Note (Signed)
Continue statin. Lipids and liver monitored by primary care. 

## 2015-03-15 NOTE — Assessment & Plan Note (Signed)
Continue aspirin and statin. 

## 2015-03-15 NOTE — Assessment & Plan Note (Signed)
Plan repeat abdominal ultrasound January 2020.

## 2015-03-15 NOTE — Assessment & Plan Note (Signed)
Blood pressure controlled. Continue present medications. Potassium and renal function monitored by primary care. 

## 2015-03-15 NOTE — Patient Instructions (Signed)
Your physician wants you to follow-up in: ONE YEAR WITH DR CRENSHAW You will receive a reminder letter in the mail two months in advance. If you don't receive a letter, please call our office to schedule the follow-up appointment.   If you need a refill on your cardiac medications before your next appointment, please call your pharmacy.  

## 2015-03-15 NOTE — Assessment & Plan Note (Signed)
Followed by electrophysiology. 

## 2015-03-22 ENCOUNTER — Encounter: Payer: Self-pay | Admitting: Internal Medicine

## 2015-04-01 DIAGNOSIS — Z961 Presence of intraocular lens: Secondary | ICD-10-CM | POA: Diagnosis not present

## 2015-04-08 ENCOUNTER — Other Ambulatory Visit: Payer: Self-pay | Admitting: Cardiology

## 2015-04-08 NOTE — Telephone Encounter (Signed)
Rx has been sent to the pharmacy electronically. ° °

## 2015-05-02 ENCOUNTER — Other Ambulatory Visit: Payer: Self-pay | Admitting: Cardiology

## 2015-05-02 NOTE — Telephone Encounter (Signed)
Rx(s) sent to pharmacy electronically.  

## 2015-05-31 IMAGING — CT CT CHEST W/ CM
2 of 3 series · 15 of 36 positions shown, 18 images · IV contrast (Omnipaque 300)
Comparison: Chest radiograph 02/24/2012

CLINICAL DATA: Chest pain.  History of lung cancer.

EXAM:
CT CHEST WITH CONTRAST
TECHNIQUE: Multidetector CT imaging of the chest was performed during
intravenous contrast administration.
CONTRAST:  80mL OMNIPAQUE IOHEXOL 300 MG/ML  SOLN

[Series 3: chest routine with · axial · 0.81mm/px · z∈[-238,+66]mm · 12 of 73 slices shown, 15 images]
[im 6/73  mediastinal]
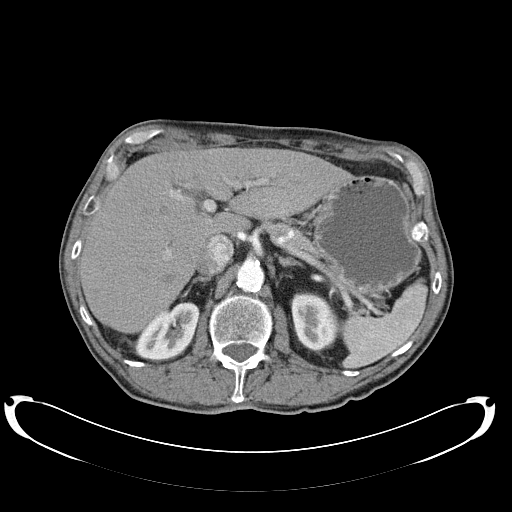
[im 6/73  lung]
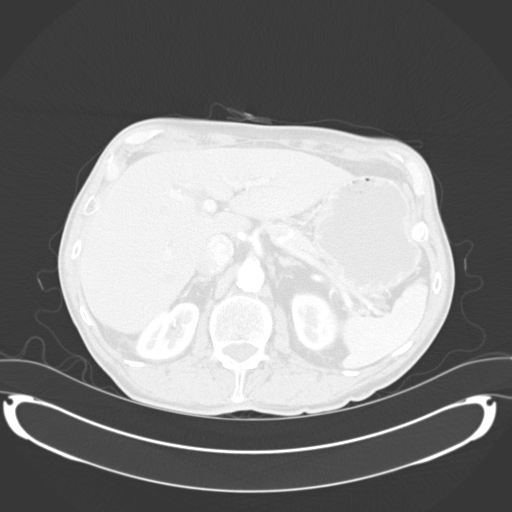
[im 11/73  lung]
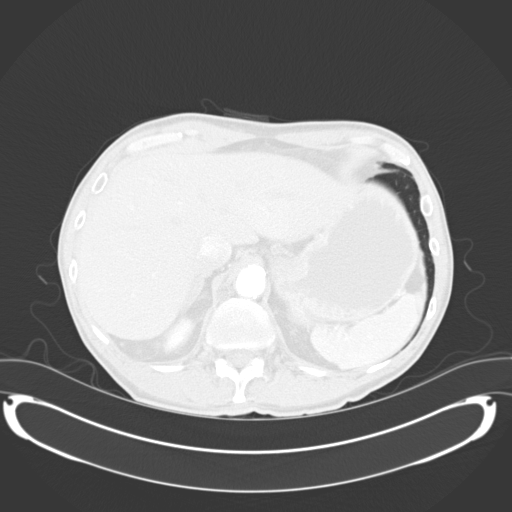
[im 17/73  lung]
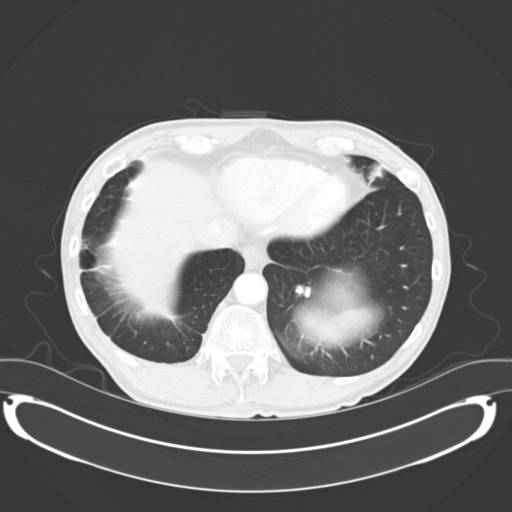
[im 22/73  lung]
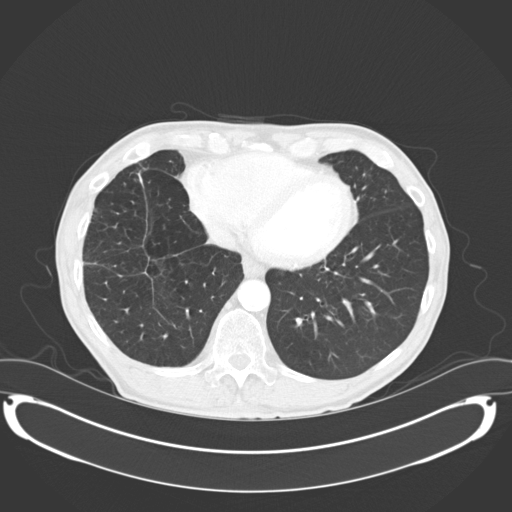
[im 27/73  mediastinal]
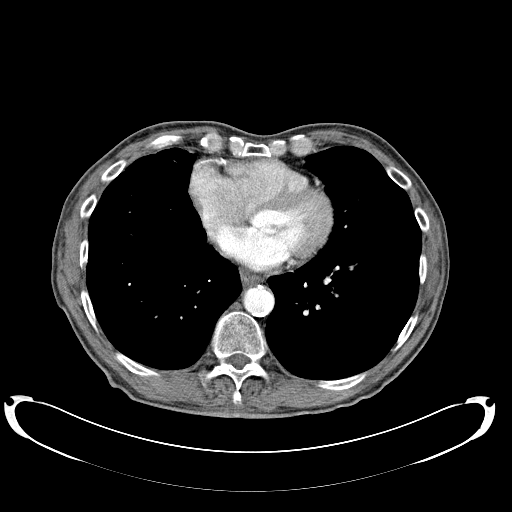
[im 27/73  lung]
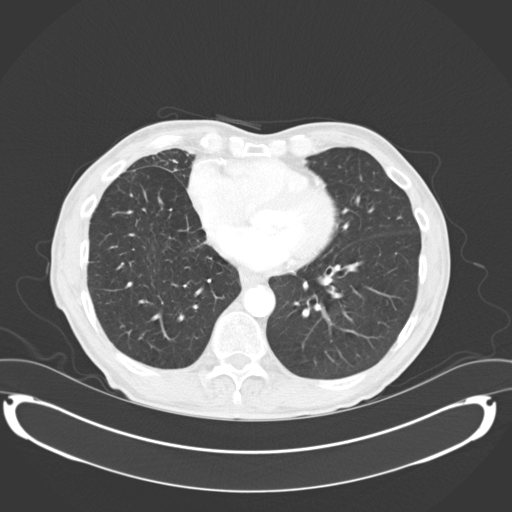
[im 33/73  lung]
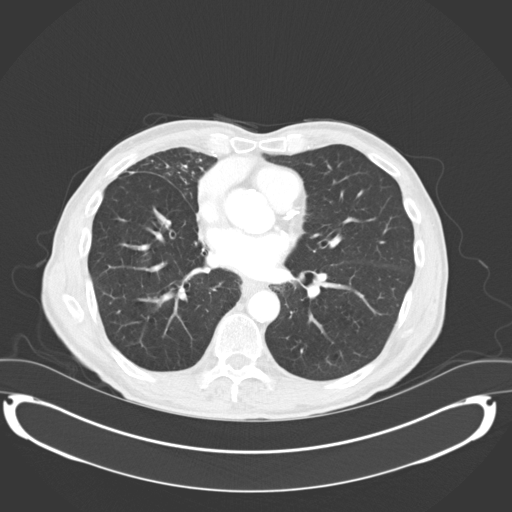
[im 41/73  lung]
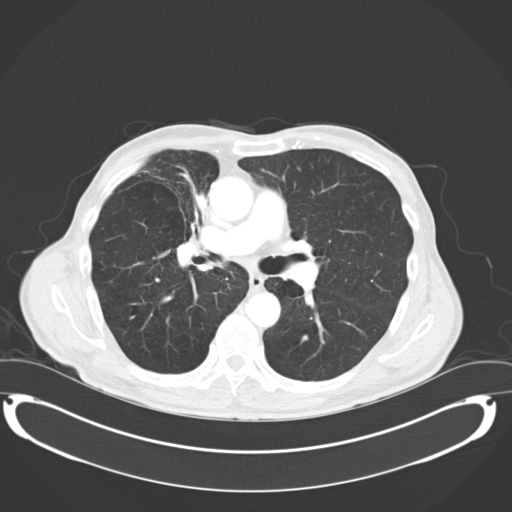
[im 46/73  lung]
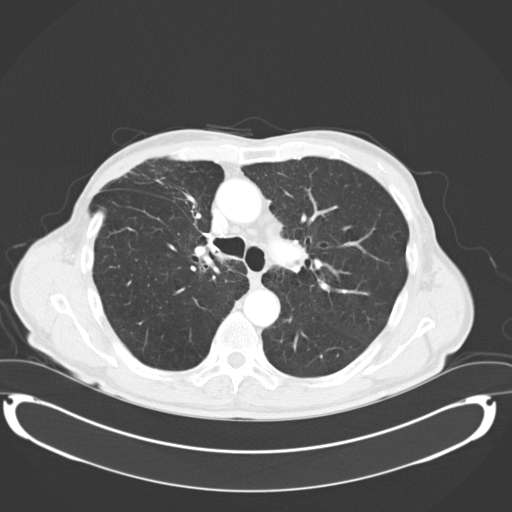
[im 51/73  mediastinal]
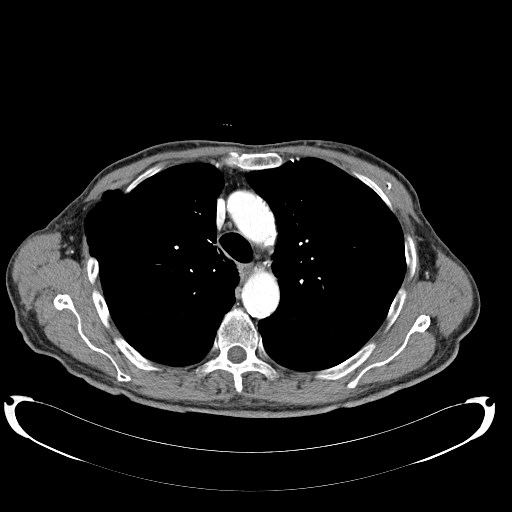
[im 51/73  lung]
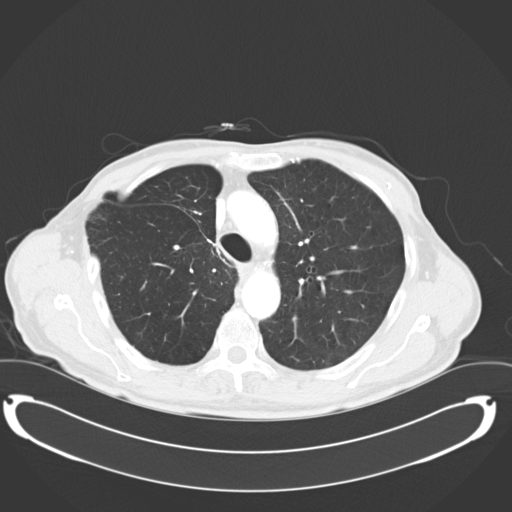
[im 57/73  lung]
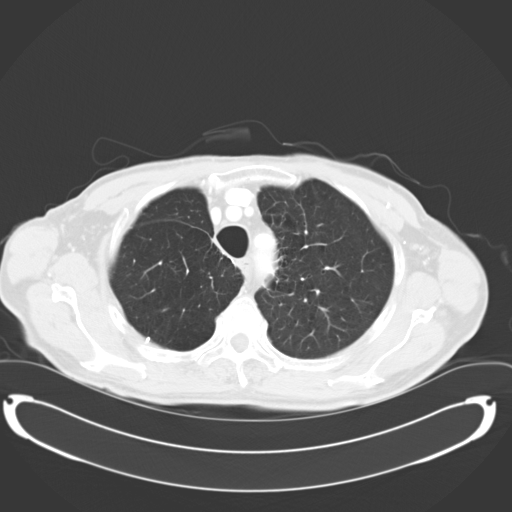
[im 62/73  lung]
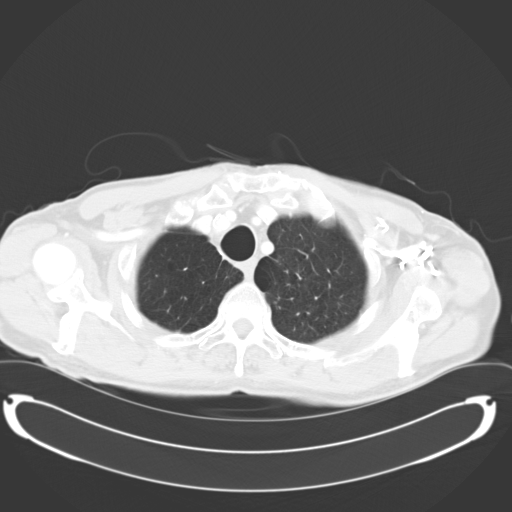
[im 67/73  lung]
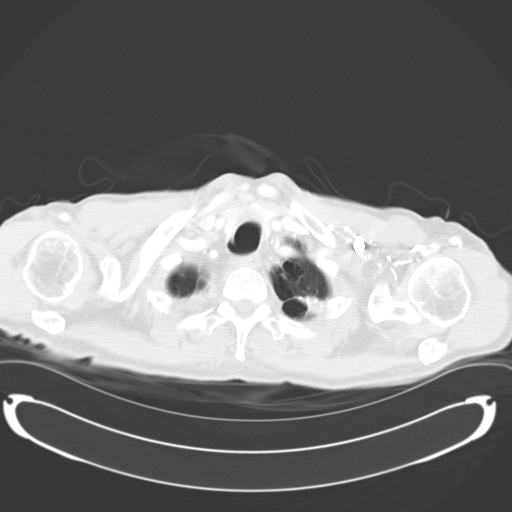

[Series 602: <mpr thick range> · coronal · 0.81mm/px · 3 of 125 slices shown]
[im 25/125  lung]
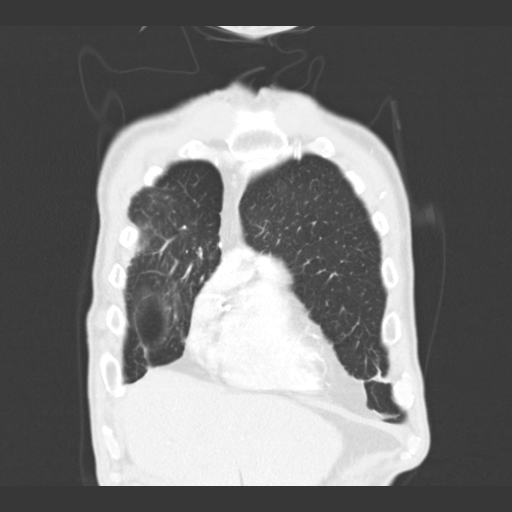
[im 50/125  lung]
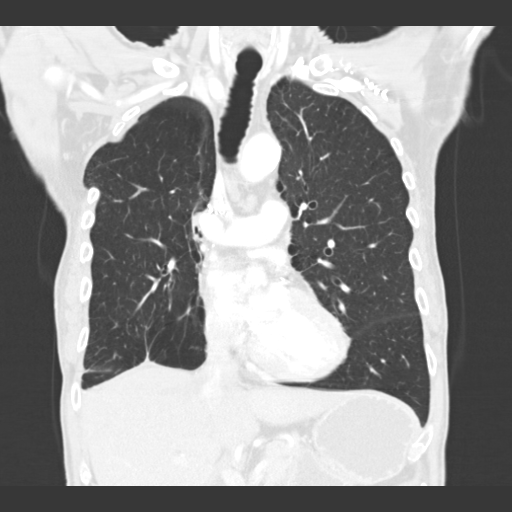
[im 75/125  lung]
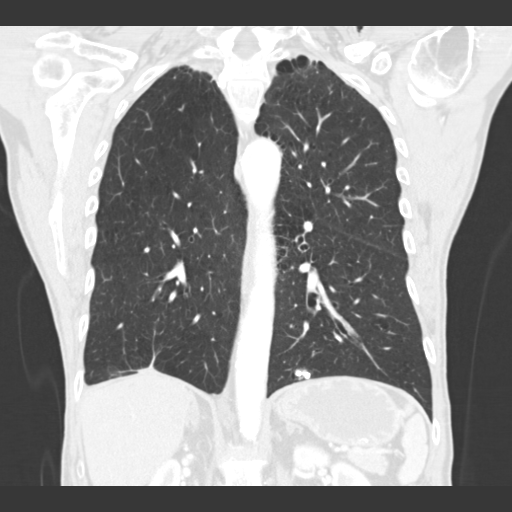

[15 of 36 positions shown; findings below may reference images not displayed]

FINDINGS: 3 mm low-attenuation nodule right lobe thyroid lobe. No enlarged
axillary, mediastinal or hilar lymphadenopathy. Normal heart size.
No pericardial effusion. Dense coronary arterial calcifications.
Normal caliber aorta and main pulmonary artery.

Central airways are patent. Postsurgical change compatible with
prior right upper lobectomy. There is compensatory hyperexpansion of
the right middle and right lower lobes. Scarring within the right
middle lobe with mild right middle lobe bronchiectasis.
Centrilobular and paraseptal emphysematous change. No pleural
effusion or pneumothorax.

There is mild herniation of the right middle and right lower lobes
through a surgical defect within the right upper anterior hemi
thorax (image 25; series 4). Calcified left lower lobe pulmonary
nodule measuring 1.2 cm. Partially calcified left upper hemithorax
pleural parenchymal thickening.

Limited visualization of the upper abdomen demonstrates mild adrenal
gland thickening.

No aggressive or acute appearing osseous lesions. Post surgical
change of the upper lateral right ribs.
IMPRESSION: Patient status post right upper lobectomy. No evidence for local
recurrence.

Right middle and right lower lobe scarring. Paraseptal and
centrilobular emphysematous change.

Herniation of a small amount of pulmonary parenchyma through a
surgical defect within the anterior right upper hemithorax.

## 2015-06-03 ENCOUNTER — Ambulatory Visit (INDEPENDENT_AMBULATORY_CARE_PROVIDER_SITE_OTHER): Payer: Medicare Other | Admitting: *Deleted

## 2015-06-03 DIAGNOSIS — I442 Atrioventricular block, complete: Secondary | ICD-10-CM | POA: Diagnosis not present

## 2015-06-03 NOTE — Progress Notes (Signed)
Remote pacemaker transmission.   

## 2015-06-25 LAB — CUP PACEART REMOTE DEVICE CHECK
Battery Remaining Percentage: 95.5 %
Battery Voltage: 3.01 V
Brady Statistic AP VS Percent: 1 %
Brady Statistic RA Percent Paced: 1 %
Implantable Lead Implant Date: 20160322
Implantable Lead Location: 753860
Implantable Lead Model: 1948
Lead Channel Sensing Intrinsic Amplitude: 4.1 mV
Lead Channel Setting Pacing Amplitude: 0.875
Lead Channel Setting Pacing Pulse Width: 0.4 ms
MDC IDC LEAD IMPLANT DT: 20160322
MDC IDC LEAD LOCATION: 753859
MDC IDC MSMT BATTERY REMAINING LONGEVITY: 131 mo
MDC IDC MSMT LEADCHNL RA IMPEDANCE VALUE: 400 Ohm
MDC IDC MSMT LEADCHNL RV IMPEDANCE VALUE: 740 Ohm
MDC IDC MSMT LEADCHNL RV PACING THRESHOLD AMPLITUDE: 0.625 V
MDC IDC MSMT LEADCHNL RV PACING THRESHOLD PULSEWIDTH: 0.4 ms
MDC IDC SESS DTM: 20170515060015
MDC IDC SET LEADCHNL RA PACING AMPLITUDE: 2 V
MDC IDC SET LEADCHNL RV SENSING SENSITIVITY: 2.5 mV
MDC IDC STAT BRADY AP VP PERCENT: 1 %
MDC IDC STAT BRADY AS VP PERCENT: 99 %
MDC IDC STAT BRADY AS VS PERCENT: 1 %
MDC IDC STAT BRADY RV PERCENT PACED: 99 %
Pulse Gen Serial Number: 7731685

## 2015-07-03 ENCOUNTER — Encounter: Payer: Self-pay | Admitting: Cardiology

## 2015-07-17 ENCOUNTER — Encounter: Payer: Self-pay | Admitting: Cardiology

## 2015-07-24 ENCOUNTER — Other Ambulatory Visit (INDEPENDENT_AMBULATORY_CARE_PROVIDER_SITE_OTHER): Payer: Medicare Other

## 2015-07-24 DIAGNOSIS — Z79899 Other long term (current) drug therapy: Secondary | ICD-10-CM | POA: Diagnosis not present

## 2015-07-24 DIAGNOSIS — E785 Hyperlipidemia, unspecified: Secondary | ICD-10-CM | POA: Diagnosis not present

## 2015-07-24 DIAGNOSIS — C61 Malignant neoplasm of prostate: Secondary | ICD-10-CM

## 2015-07-24 LAB — HEPATIC FUNCTION PANEL
ALT: 14 U/L (ref 0–53)
AST: 17 U/L (ref 0–37)
Albumin: 4.2 g/dL (ref 3.5–5.2)
Alkaline Phosphatase: 87 U/L (ref 39–117)
Bilirubin, Direct: 0.1 mg/dL (ref 0.0–0.3)
TOTAL PROTEIN: 7.1 g/dL (ref 6.0–8.3)
Total Bilirubin: 0.6 mg/dL (ref 0.2–1.2)

## 2015-07-24 LAB — BASIC METABOLIC PANEL
BUN: 12 mg/dL (ref 6–23)
CALCIUM: 9.4 mg/dL (ref 8.4–10.5)
CO2: 28 mEq/L (ref 19–32)
CREATININE: 0.78 mg/dL (ref 0.40–1.50)
Chloride: 98 mEq/L (ref 96–112)
GFR: 102.17 mL/min (ref >60.00)
Glucose, Bld: 111 mg/dL — ABNORMAL HIGH (ref 70–99)
Potassium: 4.7 mEq/L (ref 3.5–5.1)
Sodium: 131 mEq/L — ABNORMAL LOW (ref 135–145)

## 2015-07-24 LAB — LIPID PANEL
CHOLESTEROL: 105 mg/dL (ref 0–200)
HDL: 38.6 mg/dL — AB (ref >39.00)
LDL CALC: 48 mg/dL (ref 0–99)
NonHDL: 66.52
TRIGLYCERIDES: 95 mg/dL (ref 0.0–149.0)
Total CHOL/HDL Ratio: 3
VLDL: 19 mg/dL (ref 0.0–40.0)

## 2015-07-24 LAB — CBC WITH DIFFERENTIAL/PLATELET
BASOS ABS: 0.1 10*3/uL (ref 0.0–0.1)
Basophils Relative: 0.5 % (ref 0.0–3.0)
EOS ABS: 0.2 10*3/uL (ref 0.0–0.7)
Eosinophils Relative: 2.1 % (ref 0.0–5.0)
HCT: 40.3 % (ref 39.0–52.0)
HEMOGLOBIN: 13.3 g/dL (ref 13.0–17.0)
LYMPHS PCT: 12.8 % (ref 12.0–46.0)
Lymphs Abs: 1.3 10*3/uL (ref 0.7–4.0)
MCHC: 33 g/dL (ref 30.0–36.0)
MCV: 100.9 fl — ABNORMAL HIGH (ref 78.0–100.0)
MONO ABS: 0.8 10*3/uL (ref 0.1–1.0)
Monocytes Relative: 7.9 % (ref 3.0–12.0)
Neutro Abs: 7.6 10*3/uL (ref 1.4–7.7)
Neutrophils Relative %: 76.7 % (ref 43.0–77.0)
Platelets: 239 10*3/uL (ref 150.0–400.0)
RBC: 4 Mil/uL — AB (ref 4.22–5.81)
RDW: 14.3 % (ref 11.5–15.5)
WBC: 10 10*3/uL (ref 4.0–10.5)

## 2015-07-25 LAB — PSA, TOTAL AND FREE
PSA, Free Pct: 33 % (ref >25)
PSA: 0.03 ng/mL (ref <=4.00)

## 2015-07-31 ENCOUNTER — Ambulatory Visit (INDEPENDENT_AMBULATORY_CARE_PROVIDER_SITE_OTHER): Payer: Medicare Other | Admitting: Family Medicine

## 2015-07-31 ENCOUNTER — Encounter: Payer: Self-pay | Admitting: Family Medicine

## 2015-07-31 VITALS — BP 104/58 | HR 75 | Temp 97.7°F | Ht 70.0 in | Wt 147.5 lb

## 2015-07-31 DIAGNOSIS — Z0000 Encounter for general adult medical examination without abnormal findings: Secondary | ICD-10-CM | POA: Diagnosis not present

## 2015-07-31 NOTE — Progress Notes (Signed)
Dr. Frederico Hamman T. Shaleena Crusoe, MD, [facility] [place], [address] Phone: [phone] Fax: [phone]  07/31/2015  Patient: Patrick Sampson, MRN: [id number], DOB: 09-26-1934, 81 y.o.  Primary Physician:  Owens Loffler, MD   Chief Complaint  Patient presents with  . Medicare Wellness   Subjective:   Patrick Sampson is a 81 y.o. pleasant patient who presents for a medicare wellness examination:  Preventative Health Maintenance Visit:  Health Maintenance Summary Reviewed and updated, unless pt declines services.  Tobacco History Reviewed. Alcohol: No concerns, no excessive use Exercise Habits: 5 days a week STD concerns: no risk or activity to increase risk Drug Use: None Encouraged self-testicular check  Health Maintenance  Topic Date Due  . INFLUENZA VACCINE  08/20/2015  . TETANUS/TDAP  07/10/2017  . ZOSTAVAX  Completed  . PNA vac Low Risk Adult  Completed    Immunization History  Administered Date(s) Administered  . Influenza Split 10/14/2012  . Influenza Whole 01/19/2005  . Influenza, High Dose Seasonal PF 09/26/2014  . Influenza-Unspecified 10/11/2013  . Pneumococcal Conjugate-13 03/01/2013  . Pneumococcal Polysaccharide-23 01/19/2002, 07/20/2008  . Td 01/19/1997, 07/11/2007  . Zoster 10/20/2006    Patient Active Problem List   Diagnosis Date Noted  . Complete heart block ([place]) 04/09/2014    Priority: High  . STEMI, s/p PCI 11/2008 12/02/2012    Priority: High  . moderate COPD with emphysema 02/24/2012    Priority: High  . CAD S/P CFX BMS 2010 12/19/2008    Priority: High  . Cancer of lung, upper lobe, Right, in remission 08/16/2006    Priority: High  . Prostate cancer, in Remission, 12/1998 Seeds 08/16/2006    Priority: High  . Raynauds disease     Priority: Medium  . Essential hypertension 02/12/2010    Priority: Medium  . HYPERLIPIDEMIA-MIXED 12/19/2008    Priority: Medium  . PERIPHERAL  VASCULAR DISEASE 08/16/2006    Priority: Medium  . Status post placement of cardiac pacemaker 03/15/2015  . RBBB 04/09/2014  . Aortic calcification ([place]) 12/02/2012  . Hearing loss 12/02/2012  . Allergic rhinitis due to pollen   . Tinnitus of both ears   . HEMORRHOIDS 03/19/2010  . COLONIC POLYPS, ADENOMATOUS, HX OF 03/19/2010  . ABDOMINAL BRUIT 01/25/2009  . Tobacco abuse, in remission 12/19/2008  . AAA- 2.4 x 2/4 07/11/2007   Past Medical History  Diagnosis Date  . Prostate cancer ([place]) 2000  . Allergic rhinitis due to pollen   . Arthritis   . Cataract   . Hyperlipidemia   . Raynauds disease   . Tinnitus of both ears   . COPD with emphysema ([place])   . CAD (coronary artery disease) 11/2008    a. Inf STEMI 11/2008 => Mid to dist LAD 70-75%, mid CFX occluded, oRCA 50%, mRCA 50%, inf AK, EF 50% => PCI: overlapping BMSs to CFX;  b.  F/u LHC 01/2009:  mid LAD 50%, mid CFX stents patent. There was 50-70% stenosis at the prox edge of the stent; IVUS of the CFX and LAD with no hemodynamically significant stenoses=>Med Rx.;  c.  ETT-Myoview 8/14:  Inf and IL scar, no ischemia, low risk  . AAA (abdominal aortic aneurysm) ([place])     a. Abdominal US 01/2011:  2.4 x 2.4 cm. Follow up recommended in 2 years.;  b. Abdominal US (01/2013):  Infrarenal AAA 2.4 x 2.4 cm  . Cancer of lung, upper lobe, Right, in remission  2000  . Complete heart block ([place])     a. a/w syncope b. s/p STJ dual chamber pacemaker 03/2014   Past Surgical History  Procedure Laterality Date  . Angioplasty / stenting femoral    . Lung lobectomy Right 2000    Right upper lobectomy  . Transperineal implant of radiation seeds w/ ultrasound  2000    for cancer,rad.with seed plantation  . Polypectomy    . Colonoscopy  2007  . Permanent pacemaker insertion N/A 04/09/2014    Procedure: Earlean Polka WIRE;  Surgeon: Peter M Martinique, MD;  Location: [facility] CATH LAB;  Service: Cardiovascular;  Laterality: N/A;  . Permanent pacemaker insertion N/A  04/10/2014    STJ dual chamber pacemaker implanted by Dr Rayann Heman for CHB  . Cataract extraction, bilateral     Social History   Social History  . Marital Status: Married    Spouse Name: N/A  . Number of Children: N/A  . Years of Education: N/A   Occupational History  . Not on file.   Social History Main Topics  . Smoking status: Former Smoker -- 50 years    Types: Cigarettes    Quit date: 01/20/2007  . Smokeless tobacco: Never Used  . Alcohol Use: 6.0 oz/week    10 Cans of beer per week     Comment: moderate  . Drug Use: No  . Sexual Activity: Not on file   Other Topics Concern  . Not on file   Social History Narrative   Family History  Problem Relation Age of Onset  . Colon cancer Sister 61    rectal cancer  . Lung cancer Father 65    smoker and coal mining  . Congestive Heart Failure Mother   . CAD Brother   . Tuberculosis Brother   . Kidney failure Brother     ESRD on dialysis  . Coronary artery disease Brother     s/p CABG   Allergies  Allergen Reactions  . Amoxicillin Rash    Medication list has been reviewed and updated.   General: Denies fever, chills, sweats. No significant weight loss. Eyes: Denies blurring,significant itching ENT: Denies earache, sore throat, and hoarseness. Cardiovascular: Denies chest pains, palpitations, dyspnea on exertion Respiratory: Denies cough, dyspnea at rest,wheeezing Breast: no concerns about lumps GI: Denies nausea, vomiting, diarrhea, constipation, change in bowel habits, abdominal pain, melena, hematochezia GU: Denies penile discharge, ED, urinary flow / outflow problems. No STD concerns. Musculoskeletal: Denies back pain, joint pain Derm: Denies rash, itching Neuro: Denies  paresthesias, frequent falls, frequent headaches Psych: Denies depression, anxiety Endocrine: Denies cold intolerance, heat intolerance, polydipsia Heme: Denies enlarged lymph nodes Allergy: No hayfever  Objective:   BP 104/58 mmHg   Pulse 75  Temp(Src) 97.7 F (36.5 C) (Oral)  Ht '5\' 10"'  (1.778 m)  Wt 147 lb 8 oz (66.906 kg)  BMI 21.16 kg/m2  The patient completed a fall screen and PHQ-2 and PHQ-9 if necessary, which is documented in the EHR. The CMA/LPN/RN who assisted the patient verbally completed with them and documented results in [place].   Hearing Screening   Method: Audiometry   '125Hz'  '250Hz'  '500Hz'  '1000Hz'  '2000Hz'  '4000Hz'  '8000Hz'   Right ear:   20 25 40 0   Left ear:   '20 20 20 ' 0   Vision Screening Comments: Wears Glasses-Eye Exam with Dr. Gershon Crane 04/01/2015  GEN: well developed, well nourished, no acute distress Eyes: conjunctiva and lids normal, PERRLA, EOMI ENT: TM clear, nares clear, oral exam  WNL Neck: supple, no lymphadenopathy, no thyromegaly, no JVD Pulm: clear to auscultation and percussion, respiratory effort normal CV: regular rate and rhythm, S1-S2, no murmur, rub or gallop, no bruits, peripheral pulses normal and symmetric, no cyanosis, clubbing, edema or varicosities GI: soft, non-tender; no hepatosplenomegaly, masses; active bowel sounds all quadrants GU: no hernia, testicular mass, penile discharge Lymph: no cervical, axillary or inguinal adenopathy MSK: gait normal, muscle tone and strength WNL, no joint swelling, effusions, discoloration, crepitus  SKIN: clear, good turgor, color WNL, no rashes, lesions, or ulcerations Neuro: normal mental status, normal strength, sensation, and motion Psych: alert; oriented to person, place and time, normally interactive and not anxious or depressed in appearance.  All labs reviewed with patient.  Lipids:    Component Value Date/Time   CHOL 105 07/24/2015 1243   TRIG 95.0 07/24/2015 1243   HDL 38.60* 07/24/2015 1243   LDLDIRECT 50.8 08/14/2009 1057   VLDL 19.0 07/24/2015 1243   CHOLHDL 3 07/24/2015 1243   CBC: CBC Latest Ref Rng 07/24/2015 07/25/2014 04/10/2014  WBC 4.0 - 10.5 K/uL 10.0 9.9 12.8(H)  Hemoglobin 13.0 - 17.0 g/dL 13.3 14.5  12.6(L)  Hematocrit 39.0 - 52.0 % 40.3 43.4 38.2(L)  Platelets 150.0 - 400.0 K/uL 239.0 216.0 295    Basic Metabolic Panel:    Component Value Date/Time   NA 131* 07/24/2015 1243   K 4.7 07/24/2015 1243   CL 98 07/24/2015 1243   CO2 28 07/24/2015 1243   BUN 12 07/24/2015 1243   CREATININE 0.78 07/24/2015 1243   CREATININE 0.89 11/22/2013 0845   GLUCOSE 111* 07/24/2015 1243   CALCIUM 9.4 07/24/2015 1243   Hepatic Function Latest Ref Rng 07/24/2015 04/09/2014 11/22/2013  Total Protein 6.0 - 8.3 g/dL 7.1 7.3 7.3  Albumin 3.5 - 5.2 g/dL 4.2 3.9 4.3  AST 0 - 37 U/L '17 30 19  ' ALT 0 - 53 U/L '14 21 15  ' Alk Phosphatase 39 - 117 U/L 87 87 85  Total Bilirubin 0.2 - 1.2 mg/dL 0.6 0.8 0.6  Bilirubin, Direct 0.0 - 0.3 mg/dL 0.1 - -    Lab Results  Component Value Date   TSH 2.833 04/09/2014   Lab Results  Component Value Date   PSA 0.03 07/24/2015   PSA 0.02* 07/25/2014   PSA 0.02* 02/22/2013    Assessment and Plan:   Healthcare maintenance  He is doing well overall and in general. Very active. I agree with him that at 89 1/4 with multiple h/o cancers, COPD, CAD, pacemaker that if he wants to not have f/u colonoscopy, then that is reasonable choice.   Health Maintenance Exam: The patient's preventative maintenance and recommended screening tests for an annual wellness exam were reviewed in full today. Brought up to date unless services declined.  Counselled on the importance of diet, exercise, and its role in overall health and mortality. The patient's FH and SH was reviewed, including their home life, tobacco status, and drug and alcohol status.  I have personally reviewed the Medicare Annual Wellness questionnaire and have noted 1. The patient's medical and social history 2. Their use of alcohol, tobacco or illicit drugs 3. Their current medications and supplements 4. The patient's functional ability including ADL's, fall risks, home safety risks and hearing or visual              impairment. 5. Diet and physical activities 6. Evidence for depression or mood disorders 7. Reviewed Updated provider list, see scanned forms and [facility] Snapshot.  The patients weight, height, BMI and visual acuity have been recorded in the chart I have made referrals, counseling and provided education to the patient based review of the above and I have provided the pt with a written personalized care plan for preventive services.  I have provided the patient with a copy of your personalized plan for preventive services. Instructed to take the time to review along with their updated medication list.  Follow-up: No Follow-up on file. Or follow-up in 1 year for complete physical examination  Signed,  Frederico Hamman T. Aleph Nickson, MD   Patient's Medications  New Prescriptions   No medications on file  Previous Medications   ADVAIR DISKUS 250-50 MCG/DOSE AEPB    INHALE ONE PUFF INTO LUNGS TWICE DAILY   ASPIRIN EC 81 MG EC TABLET    Take 1 tablet (81 mg total) by mouth daily.   CRESTOR 40 MG TABLET    TAKE ONE-HALF TABLET BY MOUTH ONCE DAILY   LISINOPRIL (PRINIVIL,ZESTRIL) 2.5 MG TABLET    TAKE ONE TABLET BY MOUTH ONCE DAILY   LUTEIN 10 MG TABS    Take 10 mg by mouth 2 (two) times daily.     METOPROLOL TARTRATE (LOPRESSOR) 25 MG TABLET    TAKE ONE TABLET BY MOUTH TWICE DAILY   NITROGLYCERIN (NITROSTAT) 0.4 MG SL TABLET    Place 1 tablet (0.4 mg total) under the tongue every 5 (five) minutes as needed for chest pain.   SPIRIVA HANDIHALER 18 MCG INHALATION CAPSULE    INHALE ONE DOSE BY MOUTH ONCE DAILY  Modified Medications   No medications on file  Discontinued Medications   No medications on file

## 2015-07-31 NOTE — Progress Notes (Signed)
Pre visit review using our clinic review tool, if applicable. No additional management support is needed unless otherwise documented below in the visit note. 

## 2015-08-07 ENCOUNTER — Other Ambulatory Visit: Payer: Self-pay | Admitting: Cardiology

## 2015-08-07 NOTE — Telephone Encounter (Signed)
Rx(s) sent to pharmacy electronically.  

## 2015-09-05 ENCOUNTER — Other Ambulatory Visit: Payer: Self-pay | Admitting: Family Medicine

## 2015-09-18 ENCOUNTER — Encounter: Payer: Medicare Other | Admitting: Nurse Practitioner

## 2015-09-20 ENCOUNTER — Ambulatory Visit (INDEPENDENT_AMBULATORY_CARE_PROVIDER_SITE_OTHER): Payer: Medicare Other | Admitting: Nurse Practitioner

## 2015-09-20 ENCOUNTER — Encounter: Payer: Self-pay | Admitting: Internal Medicine

## 2015-09-20 ENCOUNTER — Encounter: Payer: Self-pay | Admitting: Nurse Practitioner

## 2015-09-20 VITALS — BP 116/68 | HR 96 | Ht 71.0 in | Wt 151.2 lb

## 2015-09-20 DIAGNOSIS — I2510 Atherosclerotic heart disease of native coronary artery without angina pectoris: Secondary | ICD-10-CM | POA: Diagnosis not present

## 2015-09-20 DIAGNOSIS — I442 Atrioventricular block, complete: Secondary | ICD-10-CM | POA: Diagnosis not present

## 2015-09-20 DIAGNOSIS — I10 Essential (primary) hypertension: Secondary | ICD-10-CM | POA: Diagnosis not present

## 2015-09-20 DIAGNOSIS — Z9861 Coronary angioplasty status: Secondary | ICD-10-CM

## 2015-09-20 NOTE — Progress Notes (Signed)
Electrophysiology Office Note Date: 09/20/2015  ID:  Patrick Sampson, DOB 09-24-1934, MRN [id number]  PCP: Owens Loffler, MD Primary Cardiologist: Stanford Breed Electrophysiologist: Allred  CC: Pacemaker follow-up  Patrick Sampson is a 81 y.o. male seen today for Dr Rayann Heman.  He presents today for routine electrophysiology followup.  Since last being seen in our clinic, the patient reports doing very well.  He denies chest pain, palpitations, dyspnea, PND, orthopnea, nausea, vomiting, dizziness, syncope, edema, weight gain, or early satiety.  Device History: STJ dual chamber PPM implanted 2016 for complete heart block    Past Medical History:  Diagnosis Date  . AAA (abdominal aortic aneurysm) ([place])    a. Abdominal US 01/2011:  2.4 x 2.4 cm. Follow up recommended in 2 years.;  b. Abdominal US (01/2013):  Infrarenal AAA 2.4 x 2.4 cm  . Allergic rhinitis due to pollen   . Arthritis   . CAD (coronary artery disease) 11/2008   a. Inf STEMI 11/2008 => Mid to dist LAD 70-75%, mid CFX occluded, oRCA 50%, mRCA 50%, inf AK, EF 50% => PCI: overlapping BMSs to CFX;  b.  F/u LHC 01/2009:  mid LAD 50%, mid CFX stents patent. There was 50-70% stenosis at the prox edge of the stent; IVUS of the CFX and LAD with no hemodynamically significant stenoses=>Med Rx.;  c.  ETT-Myoview 8/14:  Inf and IL scar, no ischemia, low risk  . Cancer of lung, upper lobe, Right, in remission 2000  . Cataract   . Complete heart block (HCC)    a. a/w syncope b. s/p STJ dual chamber pacemaker 03/2014  . COPD with emphysema ([place])   . Hyperlipidemia   . Prostate cancer ([place]) 2000  . Raynauds disease   . Tinnitus of both ears    Past Surgical History:  Procedure Laterality Date  . ANGIOPLASTY / STENTING FEMORAL    . CATARACT EXTRACTION, BILATERAL    . COLONOSCOPY  2007  . LUNG LOBECTOMY Right 2000   Right upper lobectomy  . PERMANENT PACEMAKER INSERTION N/A 04/09/2014   Procedure: Daneil Dan;  Surgeon: Peter M Martinique, MD;   Location[facility] CATH LAB;  Service: Cardiovascular;  Laterality: N/A;  . PERMANENT PACEMAKER INSERTION N/A 04/10/2014   STJ dual chamber pacemaker implanted by Dr Rayann Heman for CHB  . POLYPECTOMY    . TRANSPERINEAL IMPLANT OF RADIATION SEEDS W/ ULTRASOUND  2000   for cancer,rad.with seed plantation    Current Outpatient Prescriptions  Medication Sig Dispense Refill  . ADVAIR DISKUS 250-50 MCG/DOSE AEPB INHALE ONE PUFF INTO LUNGS TWICE DAILY 60 each 11  . aspirin EC 81 MG EC tablet Take 1 tablet (81 mg total) by mouth daily. 30 tablet 0  . CRESTOR 40 MG tablet TAKE ONE-HALF TABLET BY MOUTH ONCE DAILY 15 tablet 7  . lisinopril (PRINIVIL,ZESTRIL) 2.5 MG tablet TAKE ONE TABLET BY MOUTH ONCE DAILY 90 tablet 3  . Lutein 10 MG TABS Take 10 mg by mouth 2 (two) times daily.      . metoprolol tartrate (LOPRESSOR) 25 MG tablet TAKE ONE TABLET BY MOUTH TWICE DAILY 180 tablet 2  . nitroGLYCERIN (NITROSTAT) 0.4 MG SL tablet Place 1 tablet (0.4 mg total) under the tongue every 5 (five) minutes as needed for chest pain. 25 tablet 12  . SPIRIVA HANDIHALER 18 MCG inhalation capsule INHALE ONE DOSE BY MOUTH ONCE DAILY 30 capsule 11   No current facility-administered medications for this visit.     Allergies:   Amoxicillin  Social History: Social History   Social History  . Marital status: Married    Spouse name: N/A  . Number of children: N/A  . Years of education: N/A   Occupational History  . Not on file.   Social History Main Topics  . Smoking status: Former Smoker    Years: 50.00    Types: Cigarettes    Quit date: 01/20/2007  . Smokeless tobacco: Never Used  . Alcohol use 6.0 oz/week    10 Cans of beer per week     Comment: moderate  . Drug use: No  . Sexual activity: Not on file   Other Topics Concern  . Not on file   Social History Narrative  . No narrative on file    Family History: Family History  Problem Relation Age of Onset  . Colon cancer Sister 71    rectal cancer  .  Lung cancer Father 43    smoker and coal mining  . Congestive Heart Failure Mother   . CAD Brother   . Tuberculosis Brother   . Kidney failure Brother     ESRD on dialysis  . Coronary artery disease Brother     s/p CABG     Review of Systems: All other systems reviewed and are otherwise negative except as noted above.   Physical Exam: VS:  BP 116/68   Pulse 96   Ht '5\' 11"'$  (1.803 m)   Wt 151 lb 3.2 oz (68.6 kg)   BMI 21.09 kg/m  , BMI Body mass index is 21.09 kg/m.  GEN- The patient is well appearing, alert and oriented x 3 today.   HEENT: normocephalic, atraumatic; sclera clear, conjunctiva pink; hearing intact; oropharynx clear; neck supple  Lungs- Clear to ausculation bilaterally, normal work of breathing.  No wheezes, rales, rhonchi Heart- Regular rate and rhythm, no murmurs, rubs or gallops  GI- soft, non-tender, non-distended, bowel sounds present  Extremities- no clubbing, cyanosis, or edema  MS- no significant deformity or atrophy Skin- warm and dry, no rash or lesion; PPM pocket well healed Psych- euthymic mood, full affect Neuro- strength and sensation are intact  PPM Interrogation- reviewed in detail today,  See PACEART report  EKG:  EKG is not ordered today.  Recent Labs: 07/24/2015: ALT 14; BUN 12; Creatinine, Ser 0.78; Hemoglobin 13.3; Platelets 239.0; Potassium 4.7; Sodium 131   Wt Readings from Last 3 Encounters:  09/20/15 151 lb 3.2 oz (68.6 kg)  07/31/15 147 lb 8 oz (66.9 kg)  03/15/15 151 lb (68.5 kg)     Other studies Reviewed: Additional studies/ records that were reviewed today include: Dr Rayann Heman and Dr Jacalyn Lefevre office notes  Assessment and Plan:  1.  Complete heart block  Normal PPM function See Pace Art report No changes today I have discussed the available STJ firmware patch for cyberhacking with the patient today.  We have discussed the risks associated with the firmware upgrade as well as the theoretical risk for cyberhacking.  After  an informed discussion, the patient has decided not to install the firmware patch at this time.   2.  CAD No recent ischemic symptoms Continue medical therapy   3.  HTN Stable No change required today    Current medicines are reviewed at length with the patient today.   The patient does not have concerns regarding his medicines.  The following changes were made today:  none  Labs/ tests ordered today include: none No orders of the defined types were placed in  this encounter.    Disposition:   Follow up with Delilah Shan, Dr Rayann Heman 1 year     Signed, [name], NP 09/20/2015 11:36 AM  [facility] [address] [place]  [place] [address] [phone] (office) [phone] (fax

## 2015-09-20 NOTE — Patient Instructions (Signed)
Medication Instructions:  Your physician recommends that you continue on your current medications as directed. Please refer to the Current Medication list given to you today.   Labwork: None ordered  Testing/Procedures: None ordered  Follow-Up: Remote monitoring is used to monitor your Pacemaker of ICD from home. This monitoring reduces the number of office visits required to check your device to one time per year. It allows us to keep an eye on the functioning of your device to ensure it is working properly. You are scheduled for a device check from home on 12/20/15. You may send your transmission at any time that day. If you have a wireless device, the transmission will be sent automatically. After your physician reviews your transmission, you will receive a postcard with your next transmission date.   Your physician wants you to follow-up in: 1 year with Dr.Allred You will receive a reminder letter in the mail two months in advance. If you don't receive a letter, please call our office to schedule the follow-up appointment.    Any Other Special Instructions Will Be Listed Below (If Applicable).     If you need a refill on your cardiac medications before your next appointment, please call your pharmacy.   

## 2015-10-03 DIAGNOSIS — Z23 Encounter for immunization: Secondary | ICD-10-CM | POA: Diagnosis not present

## 2015-11-12 ENCOUNTER — Other Ambulatory Visit: Payer: Self-pay | Admitting: Family Medicine

## 2015-12-02 ENCOUNTER — Telehealth: Payer: Self-pay | Admitting: Cardiology

## 2015-12-02 ENCOUNTER — Telehealth: Payer: Self-pay | Admitting: *Deleted

## 2015-12-02 MED ORDER — ROSUVASTATIN CALCIUM 40 MG PO TABS: 20.0000 mg | ORAL_TABLET | Freq: Every day | ORAL | 3 refills | Status: DC

## 2015-12-02 NOTE — Telephone Encounter (Signed)
Absolutely - you can convert to either very easily. If he calls about a week before he needs refill, we will switch to Symbicort or Breo.

## 2015-12-02 NOTE — Telephone Encounter (Signed)
Mr. Wilden notified as instructed by telephone.  He would like for Dr. Lorelei Pont to go ahead a send in Rx.  Please advise which inhaler you would like to prescribe?

## 2015-12-02 NOTE — Telephone Encounter (Signed)
Spoke with pt, new generic script sent to the pharmacy.

## 2015-12-02 NOTE — Telephone Encounter (Signed)
Patient called stating that effective January his insurance company will not cover Advair. Patient stated that they will cover Breo and Symbicort. Patient wanted to let you know about this and fine out if you can change it at that time to one of these medications?

## 2015-12-02 NOTE — Telephone Encounter (Signed)
Pt requesting generic Crestor when it comes out in 2018, can he change? pls advise

## 2015-12-03 MED ORDER — BUDESONIDE-FORMOTEROL FUMARATE 160-4.5 MCG/ACT IN AERO: 2.0000 | INHALATION_SPRAY | Freq: Two times a day (BID) | RESPIRATORY_TRACT | 11 refills | Status: DC

## 2015-12-03 NOTE — Telephone Encounter (Signed)
Symbicort, 2 puffs inhaled BID.   Disp 1 or 3 mo supply, his preference.  1 year refills

## 2016-01-02 ENCOUNTER — Other Ambulatory Visit: Payer: Self-pay | Admitting: Cardiology

## 2016-01-07 DIAGNOSIS — L821 Other seborrheic keratosis: Secondary | ICD-10-CM | POA: Diagnosis not present

## 2016-01-07 DIAGNOSIS — D225 Melanocytic nevi of trunk: Secondary | ICD-10-CM | POA: Diagnosis not present

## 2016-01-07 DIAGNOSIS — Z85828 Personal history of other malignant neoplasm of skin: Secondary | ICD-10-CM | POA: Diagnosis not present

## 2016-01-07 DIAGNOSIS — D1801 Hemangioma of skin and subcutaneous tissue: Secondary | ICD-10-CM | POA: Diagnosis not present

## 2016-02-17 NOTE — Progress Notes (Signed)
HPI: FU CAD, HTN, HL, AAA. He presented to the hospital in 11/10 with an inferior STEMI. LHC 11/2008: Mid to distal LAD 70-75%, mid CFX occluded, ostial RCA 50%, mid RCA 50%, inferior AK, EF 50%. He underwent overlapping bare-metal stents to the CFX at that time. Follow up [facility] 01/2009 demonstrated mid LAD 50%, mid CFX stents patent. There was 50-70% stenosis at the proximal edge of the stent. IVUS of the CFX and LAD demonstrated no hemodynamically significant stenoses. Continued medical therapy was recommended. Nuclear study 8/14 showed inferior scar and no ischemia. Echocardiogram March 2016 showed normal LV function and mild mitral regurgitation. Patient had complete heart block at that time and had pacemaker inserted. Abdominal US 1/17: 2.3 x 2.4 cm. Since last seen, the patient has dyspnea with more extreme activities but not with routine activities. It is relieved with rest. It is not associated with chest pain. There is no orthopnea, PND or pedal edema. There is no syncope or palpitations. There is no exertional chest pain.   Current Outpatient Prescriptions  Medication Sig Dispense Refill  . aspirin EC 81 MG EC tablet Take 1 tablet (81 mg total) by mouth daily. 30 tablet 0  . budesonide-formoterol (SYMBICORT) 160-4.5 MCG/ACT inhaler Inhale 2 puffs into the lungs 2 (two) times daily.    Marland Kitchen lisinopril (PRINIVIL,ZESTRIL) 2.5 MG tablet TAKE ONE TABLET BY MOUTH ONCE DAILY 90 tablet 3  . Lutein 10 MG TABS Take 10 mg by mouth 2 (two) times daily.      . metoprolol tartrate (LOPRESSOR) 25 MG tablet TAKE ONE TABLET BY MOUTH TWICE DAILY 180 tablet 2  . nitroGLYCERIN (NITROSTAT) 0.4 MG SL tablet Place 1 tablet (0.4 mg total) under the tongue every 5 (five) minutes as needed for chest pain. 25 tablet 12  . rosuvastatin (CRESTOR) 40 MG tablet Take 0.5 tablets (20 mg total) by mouth daily. 45 tablet 3  . SPIRIVA HANDIHALER 18 MCG inhalation capsule INHALE ONE DOSE BY MOUTH ONCE DAILY 30 capsule 11    No current facility-administered medications for this visit.      Past Medical History:  Diagnosis Date  . AAA (abdominal aortic aneurysm) ([place])    a. Abdominal US 01/2011:  2.4 x 2.4 cm. Follow up recommended in 2 years.;  b. Abdominal US (01/2013):  Infrarenal AAA 2.4 x 2.4 cm  . Allergic rhinitis due to pollen   . Arthritis   . CAD (coronary artery disease) 11/2008   a. Inf STEMI 11/2008 => Mid to dist LAD 70-75%, mid CFX occluded, oRCA 50%, mRCA 50%, inf AK, EF 50% => PCI: overlapping BMSs to CFX;  b.  F/u LHC 01/2009:  mid LAD 50%, mid CFX stents patent. There was 50-70% stenosis at the prox edge of the stent; IVUS of the CFX and LAD with no hemodynamically significant stenoses=>Med Rx.;  c.  ETT-Myoview 8/14:  Inf and IL scar, no ischemia, low risk  . Cancer of lung, upper lobe, Right, in remission 2000  . Cataract   . Complete heart block (HCC)    a. a/w syncope b. s/p STJ dual chamber pacemaker 03/2014  . COPD with emphysema ([place])   . Hyperlipidemia   . Prostate cancer ([place]) 2000  . Raynauds disease   . Tinnitus of both ears     Past Surgical History:  Procedure Laterality Date  . ANGIOPLASTY / STENTING FEMORAL    . CATARACT EXTRACTION, BILATERAL    . COLONOSCOPY  2007  . LUNG  LOBECTOMY Right 2000   Right upper lobectomy  . PERMANENT PACEMAKER INSERTION N/A 04/09/2014   Procedure: Daneil Dan;  Surgeon: Peter M Martinique, MD;  Location: [facility] CATH LAB;  Service: Cardiovascular;  Laterality: N/A;  . PERMANENT PACEMAKER INSERTION N/A 04/10/2014   STJ dual chamber pacemaker implanted by Dr Rayann Heman for CHB  . POLYPECTOMY    . TRANSPERINEAL IMPLANT OF RADIATION SEEDS W/ ULTRASOUND  2000   for cancer,rad.with seed plantation    Social History   Social History  . Marital status: Married    Spouse name: N/A  . Number of children: N/A  . Years of education: N/A   Occupational History  . Not on file.   Social History Main Topics  . Smoking status: Former Smoker    Years: 50.00     Types: Cigarettes    Quit date: 01/20/2007  . Smokeless tobacco: Never Used  . Alcohol use 6.0 oz/week    10 Cans of beer per week     Comment: moderate  . Drug use: No  . Sexual activity: Not on file   Other Topics Concern  . Not on file   Social History Narrative  . No narrative on file    Family History  Problem Relation Age of Onset  . Colon cancer Sister 91    rectal cancer  . Lung cancer Father 69    smoker and coal mining  . Congestive Heart Failure Mother   . CAD Brother   . Tuberculosis Brother   . Kidney failure Brother     ESRD on dialysis  . Coronary artery disease Brother     s/p CABG    ROS: no fevers or chills, productive cough, hemoptysis, dysphasia, odynophagia, melena, hematochezia, dysuria, hematuria, rash, seizure activity, orthopnea, PND, pedal edema, claudication. Remaining systems are negative.  Physical Exam: Well-developed well-nourished in no acute distress.  Skin is warm and dry. Ecchymoses noted HEENT is normal.  Neck is supple. No bruits Chest is clear to auscultation with normal expansion.  Cardiovascular exam is regular rate and rhythm.  Abdominal exam nontender or distended. No masses palpated. Extremities show no edema. neuro grossly intact  ECG-Sinus rhythm with ventricular pacing.  A/P  1 coronary artery disease-continue aspirin and statin.  2 hypertension-blood pressure controlled. Continue present medications. Potassium and renal function monitored by primary care.  3 hyperlipidemia-continue statin. Lipids and liver monitored by primary care.  4 status post pacemaker-he is monitored by electrophysiology.  5 abdominal aortic aneurysm-plan repeat abdominal ultrasound January 2020.  Kirk Ruths, MD

## 2016-02-24 ENCOUNTER — Encounter: Payer: Self-pay | Admitting: Cardiology

## 2016-02-24 ENCOUNTER — Ambulatory Visit (INDEPENDENT_AMBULATORY_CARE_PROVIDER_SITE_OTHER): Payer: Medicare Other | Admitting: Cardiology

## 2016-02-24 VITALS — BP 134/72 | HR 72 | Ht 70.0 in | Wt 150.0 lb

## 2016-02-24 DIAGNOSIS — I2510 Atherosclerotic heart disease of native coronary artery without angina pectoris: Secondary | ICD-10-CM | POA: Diagnosis not present

## 2016-02-24 DIAGNOSIS — I10 Essential (primary) hypertension: Secondary | ICD-10-CM

## 2016-02-24 DIAGNOSIS — I714 Abdominal aortic aneurysm, without rupture, unspecified: Secondary | ICD-10-CM

## 2016-02-24 DIAGNOSIS — E7800 Pure hypercholesterolemia, unspecified: Secondary | ICD-10-CM

## 2016-02-24 NOTE — Patient Instructions (Signed)
Your physician wants you to follow-up in: ONE YEAR WITH DR CRENSHAW You will receive a reminder letter in the mail two months in advance. If you don't receive a letter, please call our office to schedule the follow-up appointment.   If you need a refill on your cardiac medications before your next appointment, please call your pharmacy.  

## 2016-04-06 DIAGNOSIS — Z961 Presence of intraocular lens: Secondary | ICD-10-CM | POA: Diagnosis not present

## 2016-04-20 ENCOUNTER — Ambulatory Visit (INDEPENDENT_AMBULATORY_CARE_PROVIDER_SITE_OTHER): Payer: Medicare Other | Admitting: *Deleted

## 2016-04-20 DIAGNOSIS — I442 Atrioventricular block, complete: Secondary | ICD-10-CM | POA: Diagnosis not present

## 2016-04-20 NOTE — Progress Notes (Signed)
Remote pacemaker transmission.   

## 2016-04-21 ENCOUNTER — Encounter: Payer: Self-pay | Admitting: Cardiology

## 2016-04-22 LAB — CUP PACEART REMOTE DEVICE CHECK
Battery Voltage: 2.99 V
Brady Statistic AP VP Percent: 1 %
Brady Statistic RA Percent Paced: 1 %
Brady Statistic RV Percent Paced: 99 %
Implantable Lead Implant Date: 20160322
Implantable Pulse Generator Implant Date: 20160322
Lead Channel Impedance Value: 760 Ohm
Lead Channel Pacing Threshold Amplitude: 0.75 V
Lead Channel Pacing Threshold Pulse Width: 0.4 ms
Lead Channel Setting Pacing Amplitude: 0.875
Lead Channel Setting Sensing Sensitivity: 2.5 mV
MDC IDC LEAD IMPLANT DT: 20160322
MDC IDC LEAD LOCATION: 753859
MDC IDC LEAD LOCATION: 753860
MDC IDC MSMT BATTERY REMAINING LONGEVITY: 131 mo
MDC IDC MSMT BATTERY REMAINING PERCENTAGE: 95.5 %
MDC IDC MSMT LEADCHNL RA IMPEDANCE VALUE: 390 Ohm
MDC IDC MSMT LEADCHNL RA SENSING INTR AMPL: 2.6 mV
MDC IDC MSMT LEADCHNL RV PACING THRESHOLD AMPLITUDE: 0.625 V
MDC IDC MSMT LEADCHNL RV PACING THRESHOLD PULSEWIDTH: 0.4 ms
MDC IDC MSMT LEADCHNL RV SENSING INTR AMPL: 12 mV
MDC IDC SESS DTM: 20180402060036
MDC IDC SET LEADCHNL RA PACING AMPLITUDE: 2 V
MDC IDC SET LEADCHNL RV PACING PULSEWIDTH: 0.4 ms
MDC IDC STAT BRADY AP VS PERCENT: 1 %
MDC IDC STAT BRADY AS VP PERCENT: 99 %
MDC IDC STAT BRADY AS VS PERCENT: 1 %
Pulse Gen Model: 2240
Pulse Gen Serial Number: 7731685

## 2016-04-23 ENCOUNTER — Other Ambulatory Visit: Payer: Self-pay | Admitting: Cardiology

## 2016-05-05 ENCOUNTER — Encounter: Payer: Self-pay | Admitting: Cardiology

## 2016-07-20 ENCOUNTER — Ambulatory Visit (INDEPENDENT_AMBULATORY_CARE_PROVIDER_SITE_OTHER): Payer: Medicare Other | Admitting: *Deleted

## 2016-07-20 DIAGNOSIS — I442 Atrioventricular block, complete: Secondary | ICD-10-CM | POA: Diagnosis not present

## 2016-07-20 NOTE — Progress Notes (Signed)
Remote pacemaker transmission.   

## 2016-07-21 ENCOUNTER — Encounter: Payer: Self-pay | Admitting: Cardiology

## 2016-07-21 LAB — CUP PACEART REMOTE DEVICE CHECK
Battery Remaining Longevity: 131 mo
Brady Statistic AP VS Percent: 1 %
Brady Statistic AS VP Percent: 99 %
Brady Statistic RA Percent Paced: 1 %
Implantable Lead Implant Date: 20160322
Implantable Lead Location: 753859
Lead Channel Impedance Value: 390 Ohm
Lead Channel Pacing Threshold Amplitude: 0.5 V
Lead Channel Pacing Threshold Pulse Width: 0.4 ms
Lead Channel Sensing Intrinsic Amplitude: 12 mV
Lead Channel Sensing Intrinsic Amplitude: 3.8 mV
Lead Channel Setting Pacing Amplitude: 0.75 V
Lead Channel Setting Pacing Amplitude: 2 V
MDC IDC LEAD IMPLANT DT: 20160322
MDC IDC LEAD LOCATION: 753860
MDC IDC MSMT BATTERY REMAINING PERCENTAGE: 95.5 %
MDC IDC MSMT BATTERY VOLTAGE: 2.99 V
MDC IDC MSMT LEADCHNL RA PACING THRESHOLD AMPLITUDE: 0.75 V
MDC IDC MSMT LEADCHNL RV IMPEDANCE VALUE: 740 Ohm
MDC IDC MSMT LEADCHNL RV PACING THRESHOLD PULSEWIDTH: 0.4 ms
MDC IDC PG IMPLANT DT: 20160322
MDC IDC PG SERIAL: 7731685
MDC IDC SESS DTM: 20180702064116
MDC IDC SET LEADCHNL RV PACING PULSEWIDTH: 0.4 ms
MDC IDC SET LEADCHNL RV SENSING SENSITIVITY: 2.5 mV
MDC IDC STAT BRADY AP VP PERCENT: 1 %
MDC IDC STAT BRADY AS VS PERCENT: 1 %
MDC IDC STAT BRADY RV PERCENT PACED: 99 %

## 2016-08-04 ENCOUNTER — Encounter: Payer: Self-pay | Admitting: Cardiology

## 2016-08-31 ENCOUNTER — Other Ambulatory Visit: Payer: Self-pay | Admitting: Family Medicine

## 2016-09-29 ENCOUNTER — Other Ambulatory Visit: Payer: Self-pay | Admitting: Cardiology

## 2016-10-05 ENCOUNTER — Encounter: Payer: Self-pay | Admitting: Internal Medicine

## 2016-10-05 ENCOUNTER — Ambulatory Visit (INDEPENDENT_AMBULATORY_CARE_PROVIDER_SITE_OTHER): Payer: Medicare Other | Admitting: Internal Medicine

## 2016-10-05 VITALS — BP 136/74 | HR 80 | Ht 71.0 in | Wt 142.8 lb

## 2016-10-05 DIAGNOSIS — I10 Essential (primary) hypertension: Secondary | ICD-10-CM

## 2016-10-05 DIAGNOSIS — I2510 Atherosclerotic heart disease of native coronary artery without angina pectoris: Secondary | ICD-10-CM

## 2016-10-05 DIAGNOSIS — I442 Atrioventricular block, complete: Secondary | ICD-10-CM

## 2016-10-05 NOTE — Patient Instructions (Addendum)
Medication Instructions:  Your physician recommends that you continue on your current medications as directed. Please refer to the Current Medication list given to you today.   Labwork: None ordered   Testing/Procedures: None ordered   Follow-Up: Your physician wants you to follow-up in: 12 months with [name], NP You will receive a reminder letter in the mail two months in advance. If you don't receive a letter, please call our office to schedule the follow-up appointment.   Remote monitoring is used to monitor your Pacemaker  from home. This monitoring reduces the number of office visits required to check your device to one time per year. It allows [place] to keep an eye on the functioning of your device to ensure it is working properly. You are scheduled for a device check from home on 01/04/17. You may send your transmission at any time that day. If you have a wireless device, the transmission will be sent automatically. After your physician reviews your transmission, you will receive a postcard with your next transmission date.  \   Any Other Special Instructions Will Be Listed Below (If Applicable).     If you need a refill on your cardiac medications before your next appointment, please call your pharmacy.

## 2016-10-05 NOTE — Progress Notes (Signed)
PCP: Owens Loffler, MD Primary Cardiologist:  Dr Stanford Breed Primary EP:  Dr Rayann Heman  Patrick Sampson is a 82 y.o. male who presents today for routine electrophysiology followup.  Since last being seen in our clinic, the patient reports doing very well.  Today, he denies symptoms of palpitations, chest pain, shortness of breath,  lower extremity edema, dizziness, presyncope, or syncope.  The patient is otherwise without complaint today.   Past Medical History:  Diagnosis Date  . AAA (abdominal aortic aneurysm) ([place])    a. Abdominal US 01/2011:  2.4 x 2.4 cm. Follow up recommended in 2 years.;  b. Abdominal US (01/2013):  Infrarenal AAA 2.4 x 2.4 cm  . Allergic rhinitis due to pollen   . Arthritis   . CAD (coronary artery disease) 11/2008   a. Inf STEMI 11/2008 => Mid to dist LAD 70-75%, mid CFX occluded, oRCA 50%, mRCA 50%, inf AK, EF 50% => PCI: overlapping BMSs to CFX;  b.  F/u LHC 01/2009:  mid LAD 50%, mid CFX stents patent. There was 50-70% stenosis at the prox edge of the stent; IVUS of the CFX and LAD with no hemodynamically significant stenoses=>Med Rx.;  c.  ETT-Myoview 8/14:  Inf and IL scar, no ischemia, low risk  . Cancer of lung, upper lobe, Right, in remission 2000  . Cataract   . Complete heart block (HCC)    a. a/w syncope b. s/p STJ dual chamber pacemaker 03/2014  . COPD with emphysema ([place])   . Hyperlipidemia   . Prostate cancer ([place]) 2000  . Raynauds disease   . Tinnitus of both ears    Past Surgical History:  Procedure Laterality Date  . ANGIOPLASTY / STENTING FEMORAL    . CATARACT EXTRACTION, BILATERAL    . COLONOSCOPY  2007  . LUNG LOBECTOMY Right 2000   Right upper lobectomy  . PERMANENT PACEMAKER INSERTION N/A 04/09/2014   Procedure: Daneil Dan;  Surgeon: Peter M Martinique, MD;  Location: [facility] CATH LAB;  Service: Cardiovascular;  Laterality: N/A;  . PERMANENT PACEMAKER INSERTION N/A 04/10/2014   STJ dual chamber pacemaker implanted by Dr Rayann Heman for CHB  . POLYPECTOMY     . TRANSPERINEAL IMPLANT OF RADIATION SEEDS W/ ULTRASOUND  2000   for cancer,rad.with seed plantation    ROS- all systems are reviewed and negative except as per HPI above  Current Outpatient Prescriptions  Medication Sig Dispense Refill  . aspirin EC 81 MG EC tablet Take 1 tablet (81 mg total) by mouth daily. 30 tablet 0  . budesonide-formoterol (SYMBICORT) 160-4.5 MCG/ACT inhaler Inhale 2 puffs into the lungs 2 (two) times daily.    Marland Kitchen lisinopril (PRINIVIL,ZESTRIL) 2.5 MG tablet TAKE ONE TABLET BY MOUTH ONCE DAILY 90 tablet 3  . Lutein 10 MG TABS Take 10 mg by mouth 2 (two) times daily.      . metoprolol tartrate (LOPRESSOR) 25 MG tablet TAKE ONE TABLET BY MOUTH TWICE DAILY 180 tablet 2  . nitroGLYCERIN (NITROSTAT) 0.4 MG SL tablet Place 1 tablet (0.4 mg total) under the tongue every 5 (five) minutes as needed for chest pain. 25 tablet 12  . rosuvastatin (CRESTOR) 40 MG tablet Take 0.5 tablets (20 mg total) by mouth daily. 45 tablet 3  . SPIRIVA HANDIHALER 18 MCG inhalation capsule INHALE ONE PUFF BY MOUTH ONCE DAILY 30 capsule 11   No current facility-administered medications for this visit.     Physical Exam: Vitals:   10/05/16 1220  BP: 136/74  Pulse:  80  SpO2: 99%  Weight: 142 lb 12.8 oz (64.8 kg)  Height: 5\' 11"  (1.803 m)    GEN- The patient is well appearing, alert and oriented x 3 today.   Head- normocephalic, atraumatic Eyes-  Sclera clear, conjunctiva pink Ears- hearing intact Oropharynx- clear Lungs- Clear to ausculation bilaterally, normal work of breathing Chest- pacemaker pocket is well healed Heart- Regular rate and rhythm, no murmurs, rubs or gallops, PMI not laterally displaced GI- soft, NT, ND, + BS Extremities- no clubbing, cyanosis, or edema  Pacemaker interrogation- reviewed in detail today,  See PACEART report   Assessment and Plan:  1. Symptomatic complete heart block Normal pacemaker function See Pace Art report No changes today  2. CAD No  ischemic symptoms No changes today  3. HTN Stable No change required today   [place] Return to see EP NP every year Follow-up with Dr Stanford Breed as scheduled  Thompson Grayer MD, [facility] 10/05/2016 12:34 PM

## 2016-10-07 ENCOUNTER — Telehealth (INDEPENDENT_AMBULATORY_CARE_PROVIDER_SITE_OTHER): Payer: Medicare Other

## 2016-10-07 DIAGNOSIS — I10 Essential (primary) hypertension: Secondary | ICD-10-CM

## 2016-10-07 DIAGNOSIS — E7849 Other hyperlipidemia: Secondary | ICD-10-CM

## 2016-10-07 DIAGNOSIS — Z8546 Personal history of malignant neoplasm of prostate: Secondary | ICD-10-CM

## 2016-10-07 DIAGNOSIS — E784 Other hyperlipidemia: Secondary | ICD-10-CM | POA: Diagnosis not present

## 2016-10-07 DIAGNOSIS — E7800 Pure hypercholesterolemia, unspecified: Secondary | ICD-10-CM

## 2016-10-07 NOTE — Telephone Encounter (Signed)
CPE labs ordered 

## 2016-10-08 ENCOUNTER — Ambulatory Visit (INDEPENDENT_AMBULATORY_CARE_PROVIDER_SITE_OTHER): Payer: Medicare Other

## 2016-10-08 VITALS — BP 104/64 | HR 67 | Temp 97.8°F | Ht 69.5 in | Wt 140.0 lb

## 2016-10-08 DIAGNOSIS — Z0000 Encounter for general adult medical examination without abnormal findings: Secondary | ICD-10-CM | POA: Diagnosis not present

## 2016-10-08 DIAGNOSIS — Z8546 Personal history of malignant neoplasm of prostate: Secondary | ICD-10-CM | POA: Diagnosis not present

## 2016-10-08 DIAGNOSIS — Z23 Encounter for immunization: Secondary | ICD-10-CM | POA: Diagnosis not present

## 2016-10-08 LAB — BASIC METABOLIC PANEL
BUN: 14 mg/dL (ref 6–23)
CALCIUM: 9.4 mg/dL (ref 8.4–10.5)
CO2: 28 mEq/L (ref 19–32)
Chloride: 97 mEq/L (ref 96–112)
Creatinine, Ser: 0.69 mg/dL (ref 0.40–1.50)
GFR: 117.33 mL/min (ref >60.00)
GLUCOSE: 93 mg/dL (ref 70–99)
POTASSIUM: 5 meq/L (ref 3.5–5.1)
SODIUM: 132 meq/L — AB (ref 135–145)

## 2016-10-08 LAB — LIPID PANEL
CHOLESTEROL: 103 mg/dL (ref 0–200)
HDL: 56.5 mg/dL (ref >39.00)
LDL CALC: 32 mg/dL (ref 0–99)
NonHDL: 46.99
Total CHOL/HDL Ratio: 2
Triglycerides: 76 mg/dL (ref 0.0–149.0)
VLDL: 15.2 mg/dL (ref 0.0–40.0)

## 2016-10-08 LAB — HEPATIC FUNCTION PANEL
ALBUMIN: 4.1 g/dL (ref 3.5–5.2)
ALT: 16 U/L (ref 0–53)
AST: 17 U/L (ref 0–37)
Alkaline Phosphatase: 72 U/L (ref 39–117)
BILIRUBIN TOTAL: 0.6 mg/dL (ref 0.2–1.2)
Bilirubin, Direct: 0.2 mg/dL (ref 0.0–0.3)
Total Protein: 6.9 g/dL (ref 6.0–8.3)

## 2016-10-08 LAB — CBC WITH DIFFERENTIAL/PLATELET
BASOS PCT: 0.5 % (ref 0.0–3.0)
Basophils Absolute: 0 10*3/uL (ref 0.0–0.1)
EOS ABS: 0.1 10*3/uL (ref 0.0–0.7)
EOS PCT: 1.3 % (ref 0.0–5.0)
HCT: 39.9 % (ref 39.0–52.0)
Hemoglobin: 13.7 g/dL (ref 13.0–17.0)
LYMPHS ABS: 1.1 10*3/uL (ref 0.7–4.0)
Lymphocytes Relative: 12.6 % (ref 12.0–46.0)
MCHC: 34.3 g/dL (ref 30.0–36.0)
MCV: 103.8 fl — ABNORMAL HIGH (ref 78.0–100.0)
MONO ABS: 0.9 10*3/uL (ref 0.1–1.0)
Monocytes Relative: 10.2 % (ref 3.0–12.0)
NEUTROS ABS: 6.5 10*3/uL (ref 1.4–7.7)
NEUTROS PCT: 75.4 % (ref 43.0–77.0)
Platelets: 237 10*3/uL (ref 150.0–400.0)
RBC: 3.85 Mil/uL — ABNORMAL LOW (ref 4.22–5.81)
RDW: 13.3 % (ref 11.5–15.5)
WBC: 8.6 10*3/uL (ref 4.0–10.5)

## 2016-10-08 NOTE — Progress Notes (Signed)
PCP notes:   Health maintenance:   Flu vaccine - administered (high dose per pt request)  Abnormal screenings:   Hearing - failed  Hearing Screening   125Hz  250Hz  500Hz  1000Hz  2000Hz  3000Hz  4000Hz  6000Hz  8000Hz   Right ear:   40 40 0  0    Left ear:   0 0 40  0     Patient concerns:   None  Nurse concerns:  None  Next PCP appt:   10/12/16 @ 1130

## 2016-10-08 NOTE — Progress Notes (Signed)
I reviewed health advisor's note, was available for consultation, and agree with documentation and plan.  

## 2016-10-08 NOTE — Progress Notes (Signed)
Pre visit review using our clinic review tool, if applicable. No additional management support is needed unless otherwise documented below in the visit note. 

## 2016-10-08 NOTE — Patient Instructions (Signed)
Patrick Sampson , Thank you for taking time to come for your Medicare Wellness Visit. I appreciate your ongoing commitment to your health goals. Please review the following plan we discussed and let me know if I can assist you in the future.   These are the goals we discussed: Goals    . Increase physical activity          Starting 10/08/2016, I will continue to exercise for at least 60 min daily.        This is a list of the screening recommended for you and due dates:  Health Maintenance  Topic Date Due  . Tetanus Vaccine  07/10/2017  . Flu Shot  Addressed  . Pneumonia vaccines  Completed   Preventive Care for Adults  A healthy lifestyle and preventive care can promote health and wellness. Preventive health guidelines for adults include the following key practices.  . A routine yearly physical is a good way to check with your health care provider about your health and preventive screening. It is a chance to share any concerns and updates on your health and to receive a thorough exam.  . Visit your dentist for a routine exam and preventive care every 6 months. Brush your teeth twice a day and floss once a day. Good oral hygiene prevents tooth decay and gum disease.  . The frequency of eye exams is based on your age, health, family medical history, use  of contact lenses, and other factors. Follow your health care provider's ecommendations for frequency of eye exams.  . Eat a healthy diet. Foods like vegetables, fruits, whole grains, low-fat dairy products, and lean protein foods contain the nutrients you need without too many calories. Decrease your intake of foods high in solid fats, added sugars, and salt. Eat the right amount of calories for you. Get information about a proper diet from your health care provider, if necessary.  . Regular physical exercise is one of the most important things you can do for your health. Most adults should get at least 150 minutes of moderate-intensity  exercise (any activity that increases your heart rate and causes you to sweat) each week. In addition, most adults need muscle-strengthening exercises on 2 or more days a week.  Silver Sneakers may be a benefit available to you. To determine eligibility, you may visit the website: www.silversneakers.com or contact program at 805-794-2863 Mon-Fri between 8AM-8PM.   . Maintain a healthy weight. The body mass index (BMI) is a screening tool to identify possible weight problems. It provides an estimate of body fat based on height and weight. Your health care provider can find your BMI and can help you achieve or maintain a healthy weight.   For adults 20 years and older: ? A BMI below 18.5 is considered underweight. ? A BMI of 18.5 to 24.9 is normal. ? A BMI of 25 to 29.9 is considered overweight. ? A BMI of 30 and above is considered obese.   . Maintain normal blood lipids and cholesterol levels by exercising and minimizing your intake of saturated fat. Eat a balanced diet with plenty of fruit and vegetables. Blood tests for lipids and cholesterol should begin at age 26 and be repeated every 5 years. If your lipid or cholesterol levels are high, you are over 50, or you are at high risk for heart disease, you may need your cholesterol levels checked more frequently. Ongoing high lipid and cholesterol levels should be treated with medicines if diet and exercise  are not working.  . If you smoke, find out from your health care provider how to quit. If you do not use tobacco, please do not start.  . If you choose to drink alcohol, please do not consume more than 2 drinks per day. One drink is considered to be 12 ounces (355 mL) of beer, 5 ounces (148 mL) of wine, or 1.5 ounces (44 mL) of liquor.  . If you are 75-29 years old, ask your health care provider if you should take aspirin to prevent strokes.  . Use sunscreen. Apply sunscreen liberally and repeatedly throughout the day. You should seek shade  when your shadow is shorter than you. Protect yourself by wearing long sleeves, pants, a wide-brimmed hat, and sunglasses year round, whenever you are outdoors.  . Once a month, do a whole body skin exam, using a mirror to look at the skin on your back. Tell your health care provider of new moles, moles that have irregular borders, moles that are larger than a pencil eraser, or moles that have changed in shape or color.

## 2016-10-08 NOTE — Progress Notes (Signed)
Subjective:   Patrick Sampson is a 82 y.o. male who presents for Medicare Annual/Subsequent preventive examination.  Review of Systems:  N/A Cardiac Risk Factors include: advanced age (>8men, >46 women);male gender;dyslipidemia;hypertension     Objective:    Vitals: BP 104/64 (BP Location: Right Arm, Patient Position: Sitting, Cuff Size: Normal)   Pulse 67   Temp 97.8 F (36.6 C) (Oral)   Ht 5' 9.5" (1.765 m) Comment: no shoes  Wt 140 lb (63.5 kg)   SpO2 99%   BMI 20.38 kg/m   Body mass index is 20.38 kg/m.  Tobacco History  Smoking Status  . Former Smoker  . Years: 50.00  . Types: Cigarettes  . Quit date: 01/20/2007  Smokeless Tobacco  . Never Used     Counseling given: No   Past Medical History:  Diagnosis Date  . AAA (abdominal aortic aneurysm) ([place])    a. Abdominal US 01/2011:  2.4 x 2.4 cm. Follow up recommended in 2 years.;  b. Abdominal US (01/2013):  Infrarenal AAA 2.4 x 2.4 cm  . Allergic rhinitis due to pollen   . Arthritis   . CAD (coronary artery disease) 11/2008   a. Inf STEMI 11/2008 => Mid to dist LAD 70-75%, mid CFX occluded, oRCA 50%, mRCA 50%, inf AK, EF 50% => PCI: overlapping BMSs to CFX;  b.  F/u LHC 01/2009:  mid LAD 50%, mid CFX stents patent. There was 50-70% stenosis at the prox edge of the stent; IVUS of the CFX and LAD with no hemodynamically significant stenoses=>Med Rx.;  c.  ETT-Myoview 8/14:  Inf and IL scar, no ischemia, low risk  . Cancer of lung, upper lobe, Right, in remission 2000  . Cataract   . Complete heart block (HCC)    a. a/w syncope b. s/p STJ dual chamber pacemaker 03/2014  . COPD with emphysema ([place])   . Hyperlipidemia   . Prostate cancer ([place]) 2000  . Raynauds disease   . Tinnitus of both ears    Past Surgical History:  Procedure Laterality Date  . ANGIOPLASTY / STENTING FEMORAL    . CATARACT EXTRACTION, BILATERAL    . COLONOSCOPY  2007  . LUNG LOBECTOMY Right 2000   Right upper lobectomy  . PERMANENT PACEMAKER  INSERTION N/A 04/09/2014   Procedure: Daneil Dan;  Surgeon: Peter M Martinique, MD;  Location: [facility] CATH LAB;  Service: Cardiovascular;  Laterality: N/A;  . PERMANENT PACEMAKER INSERTION N/A 04/10/2014   STJ dual chamber pacemaker implanted by Dr Rayann Heman for CHB  . POLYPECTOMY    . TRANSPERINEAL IMPLANT OF RADIATION SEEDS W/ ULTRASOUND  2000   for cancer,rad.with seed plantation   Family History  Problem Relation Age of Onset  . Colon cancer Sister 40       rectal cancer  . Lung cancer Father 40       smoker and coal mining  . Congestive Heart Failure Mother   . CAD Brother   . Tuberculosis Brother   . Kidney failure Brother        ESRD on dialysis  . Coronary artery disease Brother        s/p CABG   History  Sexual Activity  . Sexual activity: No    Outpatient Encounter Prescriptions as of 10/08/2016  Medication Sig  . aspirin EC 81 MG EC tablet Take 1 tablet (81 mg total) by mouth daily.  . budesonide-formoterol (SYMBICORT) 160-4.5 MCG/ACT inhaler Inhale 2 puffs into the lungs 2 (two) times daily.  Marland Kitchen  lisinopril (PRINIVIL,ZESTRIL) 2.5 MG tablet TAKE ONE TABLET BY MOUTH ONCE DAILY  . Lutein 10 MG TABS Take 10 mg by mouth 2 (two) times daily.    . metoprolol tartrate (LOPRESSOR) 25 MG tablet TAKE ONE TABLET BY MOUTH TWICE DAILY  . nitroGLYCERIN (NITROSTAT) 0.4 MG SL tablet Place 1 tablet (0.4 mg total) under the tongue every 5 (five) minutes as needed for chest pain.  . rosuvastatin (CRESTOR) 40 MG tablet Take 0.5 tablets (20 mg total) by mouth daily.  Marland Kitchen SPIRIVA HANDIHALER 18 MCG inhalation capsule INHALE ONE PUFF BY MOUTH ONCE DAILY   No facility-administered encounter medications on file as of 10/08/2016.     Activities of Daily Living In your present state of health, do you have any difficulty performing the following activities: 10/08/2016  Hearing? Y  Vision? N  Difficulty concentrating or making decisions? N  Walking or climbing stairs? Y  Dressing or bathing? N  Doing  errands, shopping? N  Preparing Food and eating ? N  Using the Toilet? N  In the past six months, have you accidently leaked urine? N  Do you have problems with loss of bowel control? N  Managing your Medications? N  Managing your Finances? N  Housekeeping or managing your Housekeeping? N  Some recent data might be hidden    Patient Care Team: Owens Loffler, MD as PCP - General Patsey Berthold, NP as Nurse Practitioner (Cardiology)   Assessment:     Hearing Screening   125Hz  250Hz  500Hz  1000Hz  2000Hz  3000Hz  4000Hz  6000Hz  8000Hz   Right ear:   40 40 0  0    Left ear:   0 0 40  0    Vision Screening Comments: Last vision exam in March 2018   Exercise Activities and Dietary recommendations Current Exercise Habits: Home exercise routine, Type of exercise: Other - see comments (stationary bike, cardio machine), Time (Minutes): 60, Frequency (Times/Week): 7, Weekly Exercise (Minutes/Week): 420, Intensity: Moderate, Exercise limited by: None identified  Goals    . Increase physical activity          Starting 10/08/2016, I will continue to exercise for at least 60 min daily.       Fall Risk Fall Risk  10/08/2016 07/31/2015 07/30/2014 03/01/2013 02/24/2012  Falls in the past year? No No Yes No No  Number falls in past yr: - - 1 - -  Injury with Fall? - - Yes - -   Depression Screen PHQ 2/9 Scores 10/08/2016 07/31/2015 07/30/2014 03/01/2013  PHQ - 2 Score 0 0 0 0  PHQ- 9 Score 0 - - -    Cognitive Function MMSE - Mini Mental State Exam 10/08/2016  Orientation to time 5  Orientation to Place 5  Registration 3  Attention/ Calculation 0  Recall 3  Language- name 2 objects 0  Language- repeat 1  Language- follow 3 step command 3  Language- read & follow direction 0  Write a sentence 0  Copy design 0  Total score 20   PLEASE NOTE: A Mini-Cog screen was completed. Maximum score is 20. A value of 0 denotes this part of [place] MMSE was not completed or the patient failed this part  of the Mini-Cog screening.   Mini-Cog Screening Orientation to Time - Max 5 pts Orientation to Place - Max 5 pts Registration - Max 3 pts Recall - Max 3 pts Language Repeat - Max 1 pts Language Follow 3 Step Command - Max 3 pts   Immunization History  Administered Date(s) Administered  . Influenza Split 10/14/2012  . Influenza Whole 01/19/2005  . Influenza, High Dose Seasonal PF 09/26/2014, 10/03/2015, 10/08/2016  . Influenza-Unspecified 10/11/2013  . Pneumococcal Conjugate-13 03/01/2013  . Pneumococcal Polysaccharide-23 01/19/2002, 07/20/2008  . Td 01/19/1997, 07/11/2007  . Zoster 10/20/2006   Screening Tests Health Maintenance  Topic Date Due  . TETANUS/TDAP  07/10/2017  . INFLUENZA VACCINE  Addressed  . PNA vac Low Risk Adult  Completed      Plan:     I have personally reviewed and addressed the Medicare Annual Wellness questionnaire and have noted the following in the patient's chart:  A. Medical and social history B. Use of alcohol, tobacco or illicit drugs  C. Current medications and supplements D. Functional ability and status E.  Nutritional status F.  Physical activity G. Advance directives H. List of other physicians I.  Hospitalizations, surgeries, and ER visits in previous 12 months J.  [place] to include hearing, vision, cognitive, depression L. Referrals and appointments - none  In addition, I have reviewed and discussed with patient certain preventive protocols, quality metrics, and best practice recommendations. A written personalized care plan for preventive services as well as general preventive health recommendations were provided to patient.  See attached scanned questionnaire for additional information.   Signed,   Lindell Noe, MHA, BS, LPN Health Coach

## 2016-10-09 LAB — PSA, TOTAL AND FREE: PSA, % Free: UNDETERMINED % (calc) (ref >25)

## 2016-10-12 ENCOUNTER — Ambulatory Visit (INDEPENDENT_AMBULATORY_CARE_PROVIDER_SITE_OTHER): Payer: Medicare Other | Admitting: Family Medicine

## 2016-10-12 ENCOUNTER — Encounter: Payer: Self-pay | Admitting: Family Medicine

## 2016-10-12 VITALS — BP 110/72 | HR 76 | Temp 97.4°F | Ht 69.5 in | Wt 143.5 lb

## 2016-10-12 DIAGNOSIS — I7300 Raynaud's syndrome without gangrene: Secondary | ICD-10-CM | POA: Diagnosis not present

## 2016-10-12 DIAGNOSIS — Z9861 Coronary angioplasty status: Secondary | ICD-10-CM

## 2016-10-12 DIAGNOSIS — I2510 Atherosclerotic heart disease of native coronary artery without angina pectoris: Secondary | ICD-10-CM | POA: Diagnosis not present

## 2016-10-12 DIAGNOSIS — C3411 Malignant neoplasm of upper lobe, right bronchus or lung: Secondary | ICD-10-CM | POA: Diagnosis not present

## 2016-10-12 DIAGNOSIS — J438 Other emphysema: Secondary | ICD-10-CM | POA: Diagnosis not present

## 2016-10-12 DIAGNOSIS — C61 Malignant neoplasm of prostate: Secondary | ICD-10-CM

## 2016-10-12 DIAGNOSIS — I10 Essential (primary) hypertension: Secondary | ICD-10-CM

## 2016-10-12 MED ORDER — ALBUTEROL SULFATE HFA 108 (90 BASE) MCG/ACT IN AERS: 1.0000 | INHALATION_SPRAY | Freq: Four times a day (QID) | RESPIRATORY_TRACT | 3 refills | Status: DC | PRN

## 2016-10-12 NOTE — Progress Notes (Signed)
Dr. Frederico Hamman T. Jireh Vinas, MD, [facility] [place], [address] Phone: [phone] Fax: [phone]  10/12/2016  Patient: Patrick Sampson, MRN: [id number], DOB: [birth year], 82 y.o.  Primary Physician:  Owens Loffler, MD   Chief Complaint  Patient presents with  . Annual Exam    Part 2   Subjective:   Patrick Sampson is a 82 y.o. very pleasant male patient who presents with the following:  Pleasant gentleman who has had a history of multiple medical problems including coronary disease, complete heart block, pacemaker implantation, prior STEMI, prior prostate cancer, prior lung cancer, as well as significant COPD.  He is doing fairly well, and as he is still able to play golf when the weather is nice.  HTN: Tolerating all medications without side effects Stable and at goal No CP, no sob. No HA.  BP Readings from Last 3 Encounters:  10/12/16 110/72  10/08/16 104/64  10/05/16 099/83    Basic Metabolic Panel:    Component Value Date/Time   NA 132 (L) 10/08/2016 1201   K 5.0 10/08/2016 1201   CL 97 10/08/2016 1201   CO2 28 10/08/2016 1201   BUN 14 10/08/2016 1201   CREATININE 0.69 10/08/2016 1201   CREATININE 0.89 11/22/2013 0845   GLUCOSE 93 10/08/2016 1201   CALCIUM 9.4 10/08/2016 1201    Lipids: Doing well, stable. Tolerating meds fine with no SE. Panel reviewed with patient.  Lipids:    Component Value Date/Time   CHOL 103 10/08/2016 1201   TRIG 76.0 10/08/2016 1201   HDL 56.50 10/08/2016 1201   LDLDIRECT 50.8 08/14/2009 1057   VLDL 15.2 10/08/2016 1201   CHOLHDL 2 10/08/2016 1201    Lab Results  Component Value Date   ALT 16 10/08/2016   AST 17 10/08/2016   ALKPHOS 72 10/08/2016   BILITOT 0.6 10/08/2016    His Raynaud's bothers him sometimes when it is cold outside.  He is very compliant with all his medications.  Past Medical History, Surgical History, Social History, Family History,  Problem List, Medications, and Allergies have been reviewed and updated if relevant.  Patient Active Problem List   Diagnosis Date Noted  . Complete heart block ([place]) 04/09/2014    Priority: High  . STEMI, s/p PCI 11/2008 12/02/2012    Priority: High  . moderate COPD with emphysema 02/24/2012    Priority: High  . CAD S/P CFX BMS 2010 12/19/2008    Priority: High  . Cancer of lung, upper lobe, Right, in remission 08/16/2006    Priority: High  . Prostate cancer, in Remission, 12/1998 Seeds 08/16/2006    Priority: High  . Raynauds disease     Priority: Medium  . Essential hypertension 02/12/2010    Priority: Medium  . HYPERLIPIDEMIA-MIXED 12/19/2008    Priority: Medium  . PERIPHERAL VASCULAR DISEASE 08/16/2006    Priority: Medium  . Status post placement of cardiac pacemaker 03/15/2015  . RBBB 04/09/2014  . Aortic calcification ([place]) 12/02/2012  . Hearing loss 12/02/2012  . Allergic rhinitis due to pollen   . Tinnitus of both ears   . HEMORRHOIDS 03/19/2010  . COLONIC POLYPS, ADENOMATOUS, HX OF 03/19/2010  . ABDOMINAL BRUIT 01/25/2009  . Tobacco abuse, in remission 12/19/2008  . AAA- 2.4 x 2/4 07/11/2007    Past Medical History:  Diagnosis Date  . AAA (abdominal aortic aneurysm) ([place])    a. Abdominal US 01/2011:  2.4 x 2.4 cm.  Follow up recommended in 2 years.;  b. Abdominal US (01/2013):  Infrarenal AAA 2.4 x 2.4 cm  . Allergic rhinitis due to pollen   . Arthritis   . CAD (coronary artery disease) 11/2008   a. Inf STEMI 11/2008 => Mid to dist LAD 70-75%, mid CFX occluded, oRCA 50%, mRCA 50%, inf AK, EF 50% => PCI: overlapping BMSs to CFX;  b.  F/u LHC 01/2009:  mid LAD 50%, mid CFX stents patent. There was 50-70% stenosis at the prox edge of the stent; IVUS of the CFX and LAD with no hemodynamically significant stenoses=>Med Rx.;  c.  ETT-Myoview 8/14:  Inf and IL scar, no ischemia, low risk  . Cancer of lung, upper lobe, Right, in remission 2000  . Cataract   . Complete  heart block (HCC)    a. a/w syncope b. s/p STJ dual chamber pacemaker 03/2014  . COPD with emphysema ([place])   . Hyperlipidemia   . Prostate cancer ([place]) 2000  . Raynauds disease   . Tinnitus of both ears     Past Surgical History:  Procedure Laterality Date  . ANGIOPLASTY / STENTING FEMORAL    . CATARACT EXTRACTION, BILATERAL    . COLONOSCOPY  2007  . LUNG LOBECTOMY Right 2000   Right upper lobectomy  . PERMANENT PACEMAKER INSERTION N/A 04/09/2014   Procedure: Daneil Dan;  Surgeon: Peter M Martinique, MD;  Location: [facility] CATH LAB;  Service: Cardiovascular;  Laterality: N/A;  . PERMANENT PACEMAKER INSERTION N/A 04/10/2014   STJ dual chamber pacemaker implanted by Dr Rayann Heman for CHB  . POLYPECTOMY    . TRANSPERINEAL IMPLANT OF RADIATION SEEDS W/ ULTRASOUND  2000   for cancer,rad.with seed plantation    Social History   Social History  . Marital status: Married    Spouse name: N/A  . Number of children: N/A  . Years of education: N/A   Occupational History  . Not on file.   Social History Main Topics  . Smoking status: Former Smoker    Years: 50.00    Types: Cigarettes    Quit date: 01/20/2007  . Smokeless tobacco: Never Used  . Alcohol use 6.0 oz/week    10 Cans of beer per week     Comment: moderate  . Drug use: No  . Sexual activity: No   Other Topics Concern  . Not on file   Social History Narrative  . No narrative on file    Family History  Problem Relation Age of Onset  . Colon cancer Sister 33       rectal cancer  . Lung cancer Father 1       smoker and coal mining  . Congestive Heart Failure Mother   . CAD Brother   . Tuberculosis Brother   . Kidney failure Brother        ESRD on dialysis  . Coronary artery disease Brother        s/p CABG    Allergies  Allergen Reactions  . Amoxicillin Rash    Medication list reviewed and updated in full in [place].   GEN: No acute illnesses, no fevers, chills. GI: No n/v/d, eating normally Pulm: No  SOB Interactive and getting along well at home.  Otherwise, ROS is as per the HPI.  Objective:   BP 110/72   Pulse 76   Temp (!) 97.4 F (36.3 C) (Oral)   Ht 5' 9.5" (1.765 m)   Wt 143 lb 8 oz (65.1 kg)  BMI 20.89 kg/m   GEN: WDWN, NAD, Non-toxic, A & O x 3 HEENT: Atraumatic, Normocephalic. Neck supple. No masses, No LAD. Ears and Nose: No external deformity. CV: RRR, No M/G/R. No JVD. No thrill. No extra heart sounds. PULM: CTA B, no wheezes, crackles, rhonchi. No retractions. No resp. distress. No accessory muscle use. EXTR: No c/c/e NEURO Normal gait.  PSYCH: Normally interactive. Conversant. Not depressed or anxious appearing.  Calm demeanor.   Laboratory and Imaging Data:  Assessment and Plan:   CAD S/P CFX BMS 2010  Malignant neoplasm of upper lobe of right lung (HCC)  Prostate cancer, in Remission, 12/1998 Seeds  Other emphysema ([place])  Essential hypertension  Raynaud's disease without gangrene  He is generally doing very well despite having had multiple obstacles in the past.  I did give him some albuterol to use, which I think would likely help him if he is more active and when he is playing golf and gets short of breath.  Follow-up: No Follow-up on file.  Future Appointments Date Time Provider [place]  01/04/2017 9:10 AM [facility] DEVICE REMOTES [facility]  10/11/2017 11:15 AM Pinson, Venetia Maxon, LPN [facility]  10/18/2017 2:45 PM Kaylie Ritter, Frederico Hamman, MD [facility]    Meds ordered this encounter  Medications  . DISCONTD: albuterol (PROVENTIL HFA;VENTOLIN HFA) 108 (90 Base) MCG/ACT inhaler    Sig: Inhale 1-2 puffs into the lungs every 6 (six) hours as needed for wheezing or shortness of breath.    Dispense:  1 Inhaler    Refill:  3  . albuterol (PROVENTIL HFA;VENTOLIN HFA) 108 (90 Base) MCG/ACT inhaler    Sig: Inhale 1-2 puffs into the lungs every 6 (six) hours as needed for wheezing or shortness of  breath.    Dispense:  1 Inhaler    Refill:  3   Medications Discontinued During This Encounter  Medication Reason  . albuterol (PROVENTIL HFA;VENTOLIN HFA) 108 (90 Base) MCG/ACT inhaler Reorder    Signed,  Frederico Hamman T. Brytni Dray, MD   Patient's Medications  New Prescriptions   ALBUTEROL (PROVENTIL HFA;VENTOLIN HFA) 108 (90 BASE) MCG/ACT INHALER    Inhale 1-2 puffs into the lungs every 6 (six) hours as needed for wheezing or shortness of breath.  Previous Medications   ASPIRIN EC 81 MG EC TABLET    Take 1 tablet (81 mg total) by mouth daily.   BUDESONIDE-FORMOTEROL (SYMBICORT) 160-4.5 MCG/ACT INHALER    Inhale 2 puffs into the lungs 2 (two) times daily.   LISINOPRIL (PRINIVIL,ZESTRIL) 2.5 MG TABLET    TAKE ONE TABLET BY MOUTH ONCE DAILY   LUTEIN 10 MG TABS    Take 10 mg by mouth 2 (two) times daily.     METOPROLOL TARTRATE (LOPRESSOR) 25 MG TABLET    TAKE ONE TABLET BY MOUTH TWICE DAILY   NITROGLYCERIN (NITROSTAT) 0.4 MG SL TABLET    Place 1 tablet (0.4 mg total) under the tongue every 5 (five) minutes as needed for chest pain.   ROSUVASTATIN (CRESTOR) 40 MG TABLET    Take 0.5 tablets (20 mg total) by mouth daily.   SPIRIVA HANDIHALER 18 MCG INHALATION CAPSULE    INHALE ONE PUFF BY MOUTH ONCE DAILY  Modified Medications   No medications on file  Discontinued Medications   No medications on file

## 2016-10-13 LAB — CUP PACEART INCLINIC DEVICE CHECK
Battery Remaining Longevity: 135 mo
Battery Voltage: 2.99 V
Date Time Interrogation Session: 20180917164055
Implantable Lead Implant Date: 20160322
Implantable Lead Implant Date: 20160322
Implantable Lead Location: 753860
Implantable Pulse Generator Implant Date: 20160322
Lead Channel Impedance Value: 375 Ohm
Lead Channel Pacing Threshold Amplitude: 0.5 V
Lead Channel Pacing Threshold Amplitude: 0.75 V
Lead Channel Pacing Threshold Amplitude: 0.75 V
Lead Channel Pacing Threshold Pulse Width: 0.4 ms
Lead Channel Pacing Threshold Pulse Width: 0.4 ms
Lead Channel Sensing Intrinsic Amplitude: 12 mV
Lead Channel Sensing Intrinsic Amplitude: 4.7 mV
Lead Channel Setting Pacing Amplitude: 2 V
Lead Channel Setting Sensing Sensitivity: 2.5 mV
MDC IDC LEAD LOCATION: 753859
MDC IDC MSMT LEADCHNL RA PACING THRESHOLD PULSEWIDTH: 0.4 ms
MDC IDC MSMT LEADCHNL RV IMPEDANCE VALUE: 725 Ohm
MDC IDC MSMT LEADCHNL RV PACING THRESHOLD AMPLITUDE: 0.5 V
MDC IDC MSMT LEADCHNL RV PACING THRESHOLD PULSEWIDTH: 0.4 ms
MDC IDC PG SERIAL: 7731685
MDC IDC SET LEADCHNL RV PACING AMPLITUDE: 0.75 V
MDC IDC SET LEADCHNL RV PACING PULSEWIDTH: 0.4 ms
MDC IDC STAT BRADY RA PERCENT PACED: 0.22 %
MDC IDC STAT BRADY RV PERCENT PACED: 99.96 %

## 2016-10-19 ENCOUNTER — Telehealth: Payer: Self-pay | Admitting: *Deleted

## 2016-10-19 NOTE — Telephone Encounter (Signed)
Patient called stating that he is confused because he got a call over the weekend from [facility] stating that they have a script for [employer] for him to pickup. Patient stated that he does not think that he needs this and wants [facility] cancelled from [facility]. Patient stated that [facility] not use [facility] and his preferred Patient stated that he did pick up his other inhaler at [facility].  pharmacy is [facility].

## 2016-10-20 NOTE — Telephone Encounter (Signed)
I do not see where we have sent in a Rx for ProAir to [place].  Preferred Pharmacy changed to [facility] on [address].  Patrick Sampson notified just not to worry about picking [facility] and I have tried to update his pharmacy to [facility] [address] and [facility] but sometime in Epic the pharmacies are showing back up in the chart even after we remove them.

## 2016-10-20 NOTE — Telephone Encounter (Signed)
[facility] in [place] and he then later told to send to [facility] and we forgot to alert [facility]

## 2016-11-11 ENCOUNTER — Ambulatory Visit (INDEPENDENT_AMBULATORY_CARE_PROVIDER_SITE_OTHER): Payer: Medicare Other | Admitting: Family Medicine

## 2016-11-11 ENCOUNTER — Encounter: Payer: Self-pay | Admitting: Family Medicine

## 2016-11-11 VITALS — BP 130/70 | HR 80 | Temp 97.4°F | Ht 69.5 in | Wt 144.2 lb

## 2016-11-11 DIAGNOSIS — Z9861 Coronary angioplasty status: Secondary | ICD-10-CM

## 2016-11-11 DIAGNOSIS — L723 Sebaceous cyst: Secondary | ICD-10-CM

## 2016-11-11 DIAGNOSIS — L089 Local infection of the skin and subcutaneous tissue, unspecified: Secondary | ICD-10-CM

## 2016-11-11 DIAGNOSIS — I2510 Atherosclerotic heart disease of native coronary artery without angina pectoris: Secondary | ICD-10-CM

## 2016-11-11 DIAGNOSIS — L02212 Cutaneous abscess of back [any part, except buttock]: Secondary | ICD-10-CM

## 2016-11-11 MED ORDER — SULFAMETHOXAZOLE-TRIMETHOPRIM 800-160 MG PO TABS: 2.0000 | ORAL_TABLET | Freq: Two times a day (BID) | ORAL | 0 refills | Status: DC

## 2016-11-11 NOTE — Progress Notes (Signed)
Dr. Frederico Hamman T. Genetta Fiero, MD, [facility] Primary [facility] and Sports Medicine [place], [address] Phone: [phone] Fax: [phone]  11/11/2016  Patient: Patrick Sampson, MRN: [id number], DOB: 09/10/1934, 82 y.o.  Primary Physician:  Owens Loffler, MD   Chief Complaint  Patient presents with  . Cyst on Back   Subjective:   Patrick Sampson is a 82 y.o. very pleasant male patient who presents with the following:  Inf sebaceous cyst: the patient has had a cyst in the midportion of his back for many years and has never caused him any current problems at all.  Over the last few days it has been progressively having some pain and discomfort as well as significant pressure in this region.  It is now turned reddened and is elevated with some fluctuance and pain to palpation.  It is also warm.  He is here for evaluation and definitive management.  I and D Septra 9 cc purulent, pus  Recheck monday  Past Medical History, Surgical History, Social History, Family History, Problem List, Medications, and Allergies have been reviewed and updated if relevant.  Patient Active Problem List   Diagnosis Date Noted  . Complete heart block ([place]) 04/09/2014    Priority: High  . STEMI, s/p PCI 11/2008 12/02/2012    Priority: High  . moderate COPD with emphysema 02/24/2012    Priority: High  . CAD S/P CFX BMS 2010 12/19/2008    Priority: High  . Cancer of lung, upper lobe, Right, in remission 08/16/2006    Priority: High  . Prostate cancer, in Remission, 12/1998 Seeds 08/16/2006    Priority: High  . Raynauds disease     Priority: Medium  . Essential hypertension 02/12/2010    Priority: Medium  . HYPERLIPIDEMIA-MIXED 12/19/2008    Priority: Medium  . PERIPHERAL VASCULAR DISEASE 08/16/2006    Priority: Medium  . Status post placement of cardiac pacemaker 03/15/2015  . RBBB 04/09/2014  . Aortic calcification ([place]) 12/02/2012  . Allergic rhinitis due to pollen   .  HEMORRHOIDS 03/19/2010  . COLONIC POLYPS, ADENOMATOUS, HX OF 03/19/2010  . Tobacco abuse, in remission 12/19/2008  . AAA- 2.4 x 2/4 07/11/2007    Past Medical History:  Diagnosis Date  . AAA (abdominal aortic aneurysm) ([place])    a. Abdominal US 01/2011:  2.4 x 2.4 cm. Follow up recommended in 2 years.;  b. Abdominal US (01/2013):  Infrarenal AAA 2.4 x 2.4 cm  . Allergic rhinitis due to pollen   . Arthritis   . CAD (coronary artery disease) 11/2008   a. Inf STEMI 11/2008 => Mid to dist LAD 70-75%, mid CFX occluded, oRCA 50%, mRCA 50%, inf AK, EF 50% => PCI: overlapping BMSs to CFX;  b.  F/u LHC 01/2009:  mid LAD 50%, mid CFX stents patent. There was 50-70% stenosis at the prox edge of the stent; IVUS of the CFX and LAD with no hemodynamically significant stenoses=>Med Rx.;  c.  ETT-Myoview 8/14:  Inf and IL scar, no ischemia, low risk  . Cancer of lung, upper lobe, Right, in remission 2000  . Cataract   . Complete heart block (HCC)    a. a/w syncope b. s/p STJ dual chamber pacemaker 03/2014  . COPD with emphysema ([employer])   . Hyperlipidemia   . Prostate cancer ([place]) 2000  . Raynauds disease   . Tinnitus of both ears     Past Surgical History:  Procedure Laterality Date  . ANGIOPLASTY /  STENTING FEMORAL    . CATARACT EXTRACTION, BILATERAL    . COLONOSCOPY  2007  . LUNG LOBECTOMY Right 2000   Right upper lobectomy  . PERMANENT PACEMAKER INSERTION N/A 04/09/2014   Procedure: Daneil Dan;  Surgeon: Peter M Martinique, MD;  Location: [facility] CATH LAB;  Service: Cardiovascular;  Laterality: N/A;  . PERMANENT PACEMAKER INSERTION N/A 04/10/2014   STJ dual chamber pacemaker implanted by Dr Rayann Heman for CHB  . POLYPECTOMY    . TRANSPERINEAL IMPLANT OF RADIATION SEEDS W/ ULTRASOUND  2000   for cancer,rad.with seed plantation    Social History   Social History  . Marital status: Married    Spouse name: N/A  . Number of children: N/A  . Years of education: N/A   Occupational History  . Not on file.    Social History Main Topics  . Smoking status: Former Smoker    Years: 50.00    Types: Cigarettes    Quit date: 01/20/2007  . Smokeless tobacco: Never Used  . Alcohol use 6.0 oz/week    10 Cans of beer per week     Comment: moderate  . Drug use: No  . Sexual activity: No   Other Topics Concern  . Not on file   Social History Narrative  . No narrative on file    Family History  Problem Relation Age of Onset  . Colon cancer Sister 57       rectal cancer  . Lung cancer Father 16       smoker and coal mining  . Congestive Heart Failure Mother   . CAD Brother   . Tuberculosis Brother   . Kidney failure Brother        ESRD on dialysis  . Coronary artery disease Brother        s/p CABG    Allergies  Allergen Reactions  . Amoxicillin Rash    Medication list reviewed and updated in full in [place].   GEN: no fevers, chills. GI: No n/v/d, eating normally Pulm: No SOB Interactive and getting along well at home.  Otherwise, ROS is as per the HPI.  Objective:   BP 130/70   Pulse 80   Temp (!) 97.4 F (36.3 C) (Oral)   Ht 5' 9.5" (1.765 m)   Wt 144 lb 4 oz (65.4 kg)   BMI 21.00 kg/m   GEN: WDWN, NAD, Non-toxic, A & O x 3 HEENT: Atraumatic, Normocephalic. Neck supple. No masses, No LAD. Ears and Nose: No external deformity. CV: RRR, No M/G/R. No JVD. No thrill. No extra heart sounds. PULM: CTA B, no wheezes, crackles, rhonchi. No retractions. No resp. distress. No accessory muscle use. EXTR: No c/c/e NEURO Normal gait.  PSYCH: Normally interactive. Conversant. Not depressed or anxious appearing.  Calm demeanor.   On the posterior aspect of the patient's back, there is an area that is approximate 5 x 5 centimeters across that is notably elevated with some areas of fluctuance.  This is pinkish to reddish in coloration and also there are some few areas of whitish discoloration near the surface of the skin.  Laboratory and Imaging Data:  Assessment and  Plan:   Infected sebaceous cyst  Back abscess - Plan: WOUND CULTURE  By history and clinical appearance of infected sebaceous cyst.  We will attempt to I&D this in the office today and placed the patient on Septra for coverage of skin flora including MRSA.  Close follow-up on Monday with me for reexamination.  I&D Indication: suspect abscess Pt complaints of: erythema, pain, swelling Location: back Size: 5 x 5 cm Verbal informed consent obtained.  Pt aware of risks not limited to but including infection, bleeding, damage to near by organs. Prep: etoh/chloraprep Anesthesia: 1%lidocaine with epi, good effect Incision made with #11 blade Would explored and loculations removed Amount expressed: 9 cc of pus and loculated purulent and odorous material Wound packed with iodoform gauze Tolerated well Routine postprocedure instructions d/w pt- remove packing in 24-48h, keep area clean and bandaged, follow up if concerns/spreading erythema/pain.   Future Appointments Date Time Provider [place]  11/16/2016 10:15 AM Owens Loffler, MD [facility]  01/04/2017 9:10 AM [facility] DEVICE REMOTES [facility]  10/11/2017 11:15 AM Pinson, Venetia Maxon, LPN [facility]  06/25/43 2:45 PM Shirlette Scarber, Frederico Hamman, MD [facility]    Meds ordered this encounter  Medications  . sulfamethoxazole-trimethoprim (BACTRIM DS,SEPTRA DS) 800-160 MG tablet    Sig: Take 2 tablets by mouth 2 (two) times daily.    Dispense:  40 tablet    Refill:  0   There are no discontinued medications. Orders Placed This Encounter  Procedures  . WOUND CULTURE    Signed,  Deandria Klute T. Armand Preast, MD   Allergies as of 11/11/2016      Reactions   Amoxicillin Rash      Medication List       Accurate as of 11/11/16 11:59 PM. Always use your most recent med list.          albuterol 108 (90 Base) MCG/ACT inhaler Commonly known as:  PROVENTIL HFA;VENTOLIN HFA Inhale 1-2 puffs into the lungs every  6 (six) hours as needed for wheezing or shortness of breath.   aspirin 81 MG EC tablet Take 1 tablet (81 mg total) by mouth daily.   lisinopril 2.5 MG tablet Commonly known as:  PRINIVIL,ZESTRIL TAKE ONE TABLET BY MOUTH ONCE DAILY   Lutein 10 MG Tabs Take 10 mg by mouth 2 (two) times daily.   metoprolol tartrate 25 MG tablet Commonly known as:  LOPRESSOR TAKE ONE TABLET BY MOUTH TWICE DAILY   nitroGLYCERIN 0.4 MG SL tablet Commonly known as:  NITROSTAT Place 1 tablet (0.4 mg total) under the tongue every 5 (five) minutes as needed for chest pain.   rosuvastatin 40 MG tablet Commonly known as:  CRESTOR Take 0.5 tablets (20 mg total) by mouth daily.   SPIRIVA HANDIHALER 18 MCG inhalation capsule Generic drug:  tiotropium INHALE ONE PUFF BY MOUTH ONCE DAILY   sulfamethoxazole-trimethoprim 800-160 MG tablet Commonly known as:  BACTRIM DS,SEPTRA DS Take 2 tablets by mouth 2 (two) times daily.   SYMBICORT 160-4.5 MCG/ACT inhaler Generic drug:  budesonide-formoterol Inhale 2 puffs into the lungs 2 (two) times daily.

## 2016-11-14 LAB — WOUND CULTURE
MICRO NUMBER: 81190980
RESULT:: NO GROWTH
SPECIMEN QUALITY: ADEQUATE

## 2016-11-16 ENCOUNTER — Encounter: Payer: Self-pay | Admitting: Family Medicine

## 2016-11-16 ENCOUNTER — Ambulatory Visit (INDEPENDENT_AMBULATORY_CARE_PROVIDER_SITE_OTHER): Payer: Medicare Other | Admitting: Family Medicine

## 2016-11-16 VITALS — BP 120/72 | HR 79 | Temp 97.4°F | Ht 69.5 in | Wt 142.5 lb

## 2016-11-16 DIAGNOSIS — L723 Sebaceous cyst: Secondary | ICD-10-CM

## 2016-11-16 DIAGNOSIS — L089 Local infection of the skin and subcutaneous tissue, unspecified: Secondary | ICD-10-CM

## 2016-11-16 NOTE — Progress Notes (Signed)
   Dr. Frederico Hamman T. Naama Sappington, MD, [facility] Primary [facility] and Sports Medicine [place], [address] Phone: [phone] Fax: [phone]  11/16/2016  Patient: Patrick Sampson, MRN: [id number], DOB: [birth year], 82 y.o.  Primary Physician:  Owens Loffler, MD   Chief Complaint  Patient presents with  . Follow-up    I&D   Subjective:   [name] is a 82 y.o. very pleasant male patient who presents with the following:  F/U I and D: sebaceous cyst?  Results for orders placed or performed in visit on 11/11/16  WOUND CULTURE  Result Value Ref Range   MICRO NUMBER: [id number]    SPECIMEN QUALITY: ADEQUATE    SOURCE: WOUND    STATUS: FINAL    GRAM STAIN:      Moderate White blood cells seen No epithelial cells seen Few Gram positive cocci in pairs   RESULT: No Growth      Infected sebaceous cyst  Wound looks clean, not infected. I used pick-ups to remove 2 small pieces of sebaceous cyst material.   Finish ABX, I recovered with large band-aid.  No concerns.  A UNIVERSAL PROCEDURE CHARGE HAS BEEN APPLIED IN THE CARE OF THIS INJURY.    Follow-up: prn  Future Appointments Date Time Provider [place]  01/04/2017 9:10 AM [facility]  10/11/2017 11:15 AM Pinson, Venetia Maxon, LPN [facility]  9/38/1829 2:45 PM Scott Vanderveer, Frederico Hamman, MD [facility]   Signed,  Maud Deed. Tyquan Carmickle, MD   Allergies as of 11/16/2016      Reactions   Amoxicillin Rash      Medication List       Accurate as of 11/16/16 10:45 AM. Always use your most recent med list.          albuterol 108 (90 Base) MCG/ACT inhaler Commonly known as:  PROVENTIL HFA;VENTOLIN HFA Inhale 1-2 puffs into the lungs every 6 (six) hours as needed for wheezing or shortness of breath.   aspirin 81 MG EC tablet Take 1 tablet (81 mg total) by mouth daily.   lisinopril 2.5 MG tablet Commonly known as:  PRINIVIL,ZESTRIL TAKE ONE TABLET BY MOUTH  ONCE DAILY   Lutein 10 MG Tabs Take 10 mg by mouth 2 (two) times daily.   metoprolol tartrate 25 MG tablet Commonly known as:  LOPRESSOR TAKE ONE TABLET BY MOUTH TWICE DAILY   nitroGLYCERIN 0.4 MG SL tablet Commonly known as:  NITROSTAT Place 1 tablet (0.4 mg total) under the tongue every 5 (five) minutes as needed for chest pain.   rosuvastatin 40 MG tablet Commonly known as:  CRESTOR Take 0.5 tablets (20 mg total) by mouth daily.   SPIRIVA HANDIHALER 18 MCG inhalation capsule Generic drug:  tiotropium INHALE ONE PUFF BY MOUTH ONCE DAILY   sulfamethoxazole-trimethoprim 800-160 MG tablet Commonly known as:  BACTRIM DS,SEPTRA DS Take 2 tablets by mouth 2 (two) times daily.   SYMBICORT 160-4.5 MCG/ACT inhaler Generic drug:  budesonide-formoterol Inhale 2 puffs into the lungs 2 (two) times daily.

## 2016-11-17 ENCOUNTER — Telehealth: Payer: Self-pay | Admitting: Family Medicine

## 2016-11-17 NOTE — Telephone Encounter (Signed)
Copied from [place] #[id number]. Topic: Quick Communication - See Telephone Encounter >> Nov 17, 2016 12:33 PM Ether Griffins B wrote: CRM for notification. See Telephone encounter for:  11/17/16. Pt. States he wants to stop taking the sulfur med. For his infection because its making him sick and decreasing appetite

## 2016-11-17 NOTE — Telephone Encounter (Signed)
I think that is ok - he was looking good yesterday. Sulfa can sometimes cause nausea.   Take care.

## 2016-11-17 NOTE — Telephone Encounter (Signed)
Call to patient- patient states he has been using the Bactrim( generic) religiously- but yesterday he vomited and today he vomited. He has decided to stop the antibiotic. He is willing to take somerthing else if required. Please advise.

## 2016-11-17 NOTE — Telephone Encounter (Signed)
Mr. Mogel notified as instructed by telephone.

## 2016-11-30 ENCOUNTER — Other Ambulatory Visit: Payer: Self-pay | Admitting: Cardiology

## 2016-11-30 NOTE — Telephone Encounter (Signed)
REFILL 

## 2016-12-11 ENCOUNTER — Other Ambulatory Visit: Payer: Self-pay | Admitting: Family Medicine

## 2017-01-04 ENCOUNTER — Telehealth: Payer: Self-pay | Admitting: Cardiology

## 2017-01-04 ENCOUNTER — Ambulatory Visit (INDEPENDENT_AMBULATORY_CARE_PROVIDER_SITE_OTHER): Payer: Medicare Other | Admitting: *Deleted

## 2017-01-04 DIAGNOSIS — I442 Atrioventricular block, complete: Secondary | ICD-10-CM | POA: Diagnosis not present

## 2017-01-04 NOTE — Progress Notes (Signed)
Remote pacemaker transmission.   

## 2017-01-04 NOTE — Telephone Encounter (Signed)
Spoke with pt and reminded pt of remote transmission that is due today. Pt verbalized understanding.   

## 2017-01-06 ENCOUNTER — Encounter: Payer: Self-pay | Admitting: Cardiology

## 2017-01-06 LAB — CUP PACEART REMOTE DEVICE CHECK
Battery Remaining Longevity: 130 mo
Battery Remaining Percentage: 95.5 %
Brady Statistic AP VS Percent: 1 %
Brady Statistic AS VP Percent: 99 %
Date Time Interrogation Session: 20181217175630
Implantable Lead Implant Date: 20160322
Implantable Lead Location: 753859
Lead Channel Impedance Value: 360 Ohm
Lead Channel Pacing Threshold Amplitude: 0.5 V
Lead Channel Pacing Threshold Pulse Width: 0.4 ms
Lead Channel Pacing Threshold Pulse Width: 0.4 ms
Lead Channel Sensing Intrinsic Amplitude: 12 mV
Lead Channel Sensing Intrinsic Amplitude: 2.1 mV
Lead Channel Setting Pacing Amplitude: 2 V
Lead Channel Setting Pacing Pulse Width: 0.4 ms
MDC IDC LEAD IMPLANT DT: 20160322
MDC IDC LEAD LOCATION: 753860
MDC IDC MSMT BATTERY VOLTAGE: 2.99 V
MDC IDC MSMT LEADCHNL RA PACING THRESHOLD AMPLITUDE: 0.75 V
MDC IDC MSMT LEADCHNL RV IMPEDANCE VALUE: 730 Ohm
MDC IDC PG IMPLANT DT: 20160322
MDC IDC PG SERIAL: 7731685
MDC IDC SET LEADCHNL RV PACING AMPLITUDE: 0.75 V
MDC IDC SET LEADCHNL RV SENSING SENSITIVITY: 2.5 mV
MDC IDC STAT BRADY AP VP PERCENT: 1 %
MDC IDC STAT BRADY AS VS PERCENT: 1 %
MDC IDC STAT BRADY RA PERCENT PACED: 1 %
MDC IDC STAT BRADY RV PERCENT PACED: 99 %

## 2017-02-03 DIAGNOSIS — L821 Other seborrheic keratosis: Secondary | ICD-10-CM | POA: Diagnosis not present

## 2017-02-03 DIAGNOSIS — D1801 Hemangioma of skin and subcutaneous tissue: Secondary | ICD-10-CM | POA: Diagnosis not present

## 2017-02-03 DIAGNOSIS — D229 Melanocytic nevi, unspecified: Secondary | ICD-10-CM | POA: Diagnosis not present

## 2017-02-03 DIAGNOSIS — L814 Other melanin hyperpigmentation: Secondary | ICD-10-CM | POA: Diagnosis not present

## 2017-02-09 ENCOUNTER — Telehealth: Payer: Self-pay | Admitting: Family Medicine

## 2017-02-09 NOTE — Telephone Encounter (Signed)
I spoke with Patrick Sampson and he has spoken with ins co; Patrick Sampson wants once a day inhaler such as Trelegy or Brieo Ellipta which is most affordable for Patrick Sampson. Patrick Sampson wants to know if could try Brieo Ellipta instead of the Spiriva and symbicort. If Brieo does not work Patrick Sampson said he would go back to spiriva and symbicort and spend the money. [facility]  [place]  Patrick Sampson request cb after Dr Lorelei Pont reviews.

## 2017-02-09 NOTE — Telephone Encounter (Signed)
Copied from [place]. Topic: Quick Communication - Rx Refill/Question >> Feb 09, 2017 10:38 AM Oliver Pila B wrote: Medication: SPIRIVA HANDIHALER 18 MCG inhalation capsule [[id number]], SYMBICORT 160-4.5 MCG/ACT inhaler [[id number]]  Pt states the prices are getting too steep, would like a cheaper alternative if available, contact pt to advise

## 2017-02-11 NOTE — Telephone Encounter (Signed)
Patient is checking status, please advise Call back [phone]

## 2017-02-12 ENCOUNTER — Other Ambulatory Visit: Payer: Self-pay | Admitting: Family Medicine

## 2017-02-12 ENCOUNTER — Encounter: Payer: Self-pay | Admitting: Family Medicine

## 2017-02-12 MED ORDER — FLUTICASONE FUROATE-VILANTEROL 200-25 MCG/INH IN AEPB: 1.0000 | INHALATION_SPRAY | Freq: Every day | RESPIRATORY_TRACT | 1 refills | Status: DC

## 2017-02-12 NOTE — Telephone Encounter (Signed)
Patrick Sampson notified by MyChart.

## 2017-02-12 NOTE — Telephone Encounter (Signed)
I sent in [place]. I just received message today.

## 2017-03-12 NOTE — Progress Notes (Signed)
HPI: FU CAD, HTN, HL, AAA. He presented to the hospital in 11/10 with an inferior STEMI. LHC 11/2008: Mid to distal LAD 70-75%, mid CFX occluded, ostial RCA 50%, mid RCA 50%, inferior AK, EF 50%. He underwent overlapping bare-metal stents to the CFX at that time. Follow up [facility] 01/2009 demonstrated mid LAD 50%, mid CFX stents patent. There was 50-70% stenosis at the proximal edge of the stent. IVUS of the CFX and LAD demonstrated no hemodynamically significant stenoses. Continued medical therapy was recommended. Nuclear study 8/14 showed inferior scar and no ischemia. Echocardiogram March 2016 showed normal LV function and mild mitral regurgitation. Patient had complete heart block at that time and had pacemaker inserted. Abdominal US 1/17: 2.3 x 2.4 cm. Since last seen, the patient has dyspnea with more extreme activities but not with routine activities. It is relieved with rest. It is not associated with chest pain. There is no orthopnea, PND or pedal edema. There is no syncope or palpitations. There is no exertional chest pain.   Current Outpatient Medications  Medication Sig Dispense Refill  . aspirin EC 81 MG EC tablet Take 1 tablet (81 mg total) by mouth daily. 30 tablet 0  . fluticasone furoate-vilanterol (BREO ELLIPTA) 200-25 MCG/INH AEPB Inhale 1 puff into the lungs daily. Disp 3 mo supply (3 packs equivalent) 3 each 1  . lisinopril (PRINIVIL,ZESTRIL) 2.5 MG tablet TAKE ONE TABLET BY MOUTH ONCE DAILY 90 tablet 3  . Lutein 10 MG TABS Take 10 mg by mouth 2 (two) times daily.      . metoprolol tartrate (LOPRESSOR) 25 MG tablet TAKE ONE TABLET BY MOUTH TWICE DAILY 180 tablet 2  . nitroGLYCERIN (NITROSTAT) 0.4 MG SL tablet Place 1 tablet (0.4 mg total) under the tongue every 5 (five) minutes as needed for chest pain. 25 tablet 12  . rosuvastatin (CRESTOR) 40 MG tablet TAKE ONE-HALF TABLET BY MOUTH ONCE DAILY 45 tablet 3   No current facility-administered medications for this visit.       Past Medical History:  Diagnosis Date  . AAA (abdominal aortic aneurysm) ([place])    a. Abdominal US 01/2011:  2.4 x 2.4 cm. Follow up recommended in 2 years.;  b. Abdominal US (01/2013):  Infrarenal AAA 2.4 x 2.4 cm  . Allergic rhinitis due to pollen   . Arthritis   . CAD (coronary artery disease) 11/2008   a. Inf STEMI 11/2008 => Mid to dist LAD 70-75%, mid CFX occluded, oRCA 50%, mRCA 50%, inf AK, EF 50% => PCI: overlapping BMSs to CFX;  b.  F/u LHC 01/2009:  mid LAD 50%, mid CFX stents patent. There was 50-70% stenosis at the prox edge of the stent; IVUS of the CFX and LAD with no hemodynamically significant stenoses=>Med Rx.;  c.  ETT-Myoview 8/14:  Inf and IL scar, no ischemia, low risk  . Cancer of lung, upper lobe, Right, in remission 2000  . Cataract   . Complete heart block (HCC)    a. a/w syncope b. s/p STJ dual chamber pacemaker 03/2014  . COPD with emphysema ([place])   . Hyperlipidemia   . Prostate cancer ([place]) 2000  . Raynauds disease   . Tinnitus of both ears     Past Surgical History:  Procedure Laterality Date  . ANGIOPLASTY / STENTING FEMORAL    . CATARACT EXTRACTION, BILATERAL    . COLONOSCOPY  2007  . LUNG LOBECTOMY Right 2000   Right upper lobectomy  . PERMANENT PACEMAKER INSERTION  N/A 04/09/2014   Procedure: Daneil Dan;  Surgeon: Peter M Martinique, MD;  Location: [facility] CATH LAB;  Service: Cardiovascular;  Laterality: N/A;  . PERMANENT PACEMAKER INSERTION N/A 04/10/2014   STJ dual chamber pacemaker implanted by Dr Rayann Heman for CHB  . POLYPECTOMY    . 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 Marital status: Married    Spouse name: Not on file  . Number of children: Not on file  . Years of education: Not on file  . Highest education level: Not on file  Social Needs  . Financial resource strain: Not on file  . Food insecurity - worry: Not on file  . Food insecurity  - inability: Not on file  . Transportation needs - medical: Not on file  . Transportation needs - non-medical: Not on file  Occupational History  . Not on file  Tobacco Use  . Smoking status: Former Smoker    Years: 50.00    Types: Cigarettes    Last attempt to quit: 01/20/2007    Years since quitting: 10.1  . Smokeless tobacco: Never Used  Substance and Sexual Activity  . Alcohol use: Yes    Alcohol/week: 6.0 oz    Types: 10 Cans of beer per week    Comment: moderate  . Drug use: No  . Sexual activity: No  Other Topics Concern  . Not on file  Social History Narrative  . Not on file    Family History  Problem Relation Age of Onset  . Colon cancer Sister 9       rectal cancer  . Lung cancer Father 48       smoker and coal mining  . Congestive Heart Failure Mother   . CAD Brother   . Tuberculosis Brother   . Kidney failure Brother        ESRD on dialysis  . Coronary artery disease Brother        s/p CABG    ROS: no fevers or chills, productive cough, hemoptysis, dysphasia, odynophagia, melena, hematochezia, dysuria, hematuria, rash, seizure activity, orthopnea, PND, pedal edema, claudication. Remaining systems are negative.  Physical Exam: Well-developed well-nourished in no acute distress.  Skin is warm and dry.  HEENT is normal.  Neck is supple.  Chest is clear to auscultation with normal expansion.  Cardiovascular exam is regular rate and rhythm.  Abdominal exam nontender or distended. No masses palpated. Extremities show no edema. neuro grossly intact  ECG-normal sinus rhythm with ventricular pacing.  Personally reviewed  A/P  1 coronary artery disease-continue aspirin and statin.  2 abdominal aortic aneurysm-plan repeat abdominal ultrasound January 2020.  3 hypertension-blood pressure is controlled.  Continue present medications.  4 hyperlipidemia-continue statin.  Lipids and liver monitored by primary care.  5 prior pacemaker-followed by  electrophysiology.  Kirk Ruths, MD

## 2017-03-22 ENCOUNTER — Ambulatory Visit (INDEPENDENT_AMBULATORY_CARE_PROVIDER_SITE_OTHER): Payer: Medicare Other | Admitting: Cardiology

## 2017-03-22 ENCOUNTER — Encounter: Payer: Self-pay | Admitting: Cardiology

## 2017-03-22 VITALS — BP 128/80 | HR 68 | Ht 69.5 in | Wt 143.0 lb

## 2017-03-22 DIAGNOSIS — I713 Abdominal aortic aneurysm, ruptured, unspecified: Secondary | ICD-10-CM

## 2017-03-22 DIAGNOSIS — E7800 Pure hypercholesterolemia, unspecified: Secondary | ICD-10-CM | POA: Diagnosis not present

## 2017-03-22 DIAGNOSIS — I2510 Atherosclerotic heart disease of native coronary artery without angina pectoris: Secondary | ICD-10-CM | POA: Diagnosis not present

## 2017-03-22 DIAGNOSIS — I10 Essential (primary) hypertension: Secondary | ICD-10-CM

## 2017-03-22 NOTE — Patient Instructions (Signed)
Your physician wants you to follow-up in: ONE YEAR WITH DR CRENSHAW You will receive a reminder letter in the mail two months in advance. If you don't receive a letter, please call our office to schedule the follow-up appointment.   If you need a refill on your cardiac medications before your next appointment, please call your pharmacy.  

## 2017-04-05 ENCOUNTER — Ambulatory Visit (INDEPENDENT_AMBULATORY_CARE_PROVIDER_SITE_OTHER): Payer: Medicare Other | Admitting: *Deleted

## 2017-04-05 DIAGNOSIS — I442 Atrioventricular block, complete: Secondary | ICD-10-CM

## 2017-04-05 NOTE — Progress Notes (Signed)
Remote pacemaker transmission.   

## 2017-04-06 LAB — CUP PACEART REMOTE DEVICE CHECK
Battery Remaining Longevity: 131 mo
Battery Remaining Percentage: 95.5 %
Battery Voltage: 2.99 V
Brady Statistic RA Percent Paced: 1 %
Date Time Interrogation Session: 20190318060014
Implantable Lead Implant Date: 20160322
Implantable Lead Location: 753859
Implantable Lead Model: 1948
Lead Channel Impedance Value: 380 Ohm
Lead Channel Impedance Value: 700 Ohm
Lead Channel Pacing Threshold Pulse Width: 0.4 ms
Lead Channel Pacing Threshold Pulse Width: 0.4 ms
Lead Channel Sensing Intrinsic Amplitude: 2.7 mV
MDC IDC LEAD IMPLANT DT: 20160322
MDC IDC LEAD LOCATION: 753860
MDC IDC MSMT LEADCHNL RA PACING THRESHOLD AMPLITUDE: 0.75 V
MDC IDC MSMT LEADCHNL RV PACING THRESHOLD AMPLITUDE: 0.625 V
MDC IDC MSMT LEADCHNL RV SENSING INTR AMPL: 12 mV
MDC IDC PG IMPLANT DT: 20160322
MDC IDC SET LEADCHNL RA PACING AMPLITUDE: 2 V
MDC IDC SET LEADCHNL RV PACING AMPLITUDE: 0.875
MDC IDC SET LEADCHNL RV PACING PULSEWIDTH: 0.4 ms
MDC IDC SET LEADCHNL RV SENSING SENSITIVITY: 2.5 mV
MDC IDC STAT BRADY AP VP PERCENT: 1 %
MDC IDC STAT BRADY AP VS PERCENT: 1 %
MDC IDC STAT BRADY AS VP PERCENT: 99 %
MDC IDC STAT BRADY AS VS PERCENT: 1 %
MDC IDC STAT BRADY RV PERCENT PACED: 99 %
Pulse Gen Model: 2240
Pulse Gen Serial Number: 7731685

## 2017-04-07 ENCOUNTER — Other Ambulatory Visit: Payer: Self-pay | Admitting: Cardiology

## 2017-04-07 ENCOUNTER — Encounter: Payer: Self-pay | Admitting: Cardiology

## 2017-04-16 DIAGNOSIS — Z961 Presence of intraocular lens: Secondary | ICD-10-CM | POA: Diagnosis not present

## 2017-04-16 DIAGNOSIS — H353131 Nonexudative age-related macular degeneration, bilateral, early dry stage: Secondary | ICD-10-CM | POA: Diagnosis not present

## 2017-06-17 ENCOUNTER — Other Ambulatory Visit: Payer: Self-pay | Admitting: Cardiology

## 2017-06-17 NOTE — Telephone Encounter (Signed)
Rx sent to pharmacy   

## 2017-07-05 ENCOUNTER — Ambulatory Visit (INDEPENDENT_AMBULATORY_CARE_PROVIDER_SITE_OTHER): Payer: Medicare Other | Admitting: *Deleted

## 2017-07-05 DIAGNOSIS — I442 Atrioventricular block, complete: Secondary | ICD-10-CM | POA: Diagnosis not present

## 2017-07-05 NOTE — Progress Notes (Signed)
Remote pacemaker transmission.   

## 2017-07-16 LAB — CUP PACEART REMOTE DEVICE CHECK
Battery Remaining Longevity: 131 mo
Battery Voltage: 2.99 V
Brady Statistic RA Percent Paced: 1 %
Date Time Interrogation Session: 20190617060012
Implantable Lead Implant Date: 20160322
Implantable Lead Location: 753859
Implantable Lead Model: 1948
Lead Channel Impedance Value: 380 Ohm
Lead Channel Pacing Threshold Amplitude: 0.75 V
Lead Channel Pacing Threshold Pulse Width: 0.4 ms
Lead Channel Sensing Intrinsic Amplitude: 12 mV
Lead Channel Setting Pacing Amplitude: 2 V
MDC IDC LEAD IMPLANT DT: 20160322
MDC IDC LEAD LOCATION: 753860
MDC IDC MSMT BATTERY REMAINING PERCENTAGE: 95.5 %
MDC IDC MSMT LEADCHNL RA SENSING INTR AMPL: 3.5 mV
MDC IDC MSMT LEADCHNL RV IMPEDANCE VALUE: 740 Ohm
MDC IDC MSMT LEADCHNL RV PACING THRESHOLD AMPLITUDE: 0.625 V
MDC IDC MSMT LEADCHNL RV PACING THRESHOLD PULSEWIDTH: 0.4 ms
MDC IDC PG IMPLANT DT: 20160322
MDC IDC SET LEADCHNL RV PACING AMPLITUDE: 0.875
MDC IDC SET LEADCHNL RV PACING PULSEWIDTH: 0.4 ms
MDC IDC SET LEADCHNL RV SENSING SENSITIVITY: 2.5 mV
MDC IDC STAT BRADY AP VP PERCENT: 1 %
MDC IDC STAT BRADY AP VS PERCENT: 1 %
MDC IDC STAT BRADY AS VP PERCENT: 99 %
MDC IDC STAT BRADY AS VS PERCENT: 1 %
MDC IDC STAT BRADY RV PERCENT PACED: 99 %
Pulse Gen Model: 2240
Pulse Gen Serial Number: 7731685

## 2017-07-20 ENCOUNTER — Other Ambulatory Visit: Payer: Self-pay | Admitting: *Deleted

## 2017-08-17 ENCOUNTER — Other Ambulatory Visit: Payer: Self-pay | Admitting: Family Medicine

## 2017-09-02 DIAGNOSIS — H43811 Vitreous degeneration, right eye: Secondary | ICD-10-CM | POA: Diagnosis not present

## 2017-09-02 DIAGNOSIS — H353211 Exudative age-related macular degeneration, right eye, with active choroidal neovascularization: Secondary | ICD-10-CM | POA: Diagnosis not present

## 2017-09-02 DIAGNOSIS — H353132 Nonexudative age-related macular degeneration, bilateral, intermediate dry stage: Secondary | ICD-10-CM | POA: Diagnosis not present

## 2017-09-06 DIAGNOSIS — H353211 Exudative age-related macular degeneration, right eye, with active choroidal neovascularization: Secondary | ICD-10-CM | POA: Diagnosis not present

## 2017-10-04 ENCOUNTER — Ambulatory Visit (INDEPENDENT_AMBULATORY_CARE_PROVIDER_SITE_OTHER): Payer: Medicare Other | Admitting: *Deleted

## 2017-10-04 DIAGNOSIS — I442 Atrioventricular block, complete: Secondary | ICD-10-CM

## 2017-10-04 NOTE — Progress Notes (Signed)
Remote pacemaker transmission.   

## 2017-10-06 DIAGNOSIS — Z23 Encounter for immunization: Secondary | ICD-10-CM | POA: Diagnosis not present

## 2017-10-11 ENCOUNTER — Ambulatory Visit: Payer: Medicare Other

## 2017-10-11 DIAGNOSIS — H353211 Exudative age-related macular degeneration, right eye, with active choroidal neovascularization: Secondary | ICD-10-CM | POA: Diagnosis not present

## 2017-10-11 DIAGNOSIS — H353132 Nonexudative age-related macular degeneration, bilateral, intermediate dry stage: Secondary | ICD-10-CM | POA: Diagnosis not present

## 2017-10-11 DIAGNOSIS — Z961 Presence of intraocular lens: Secondary | ICD-10-CM | POA: Diagnosis not present

## 2017-10-11 DIAGNOSIS — H43811 Vitreous degeneration, right eye: Secondary | ICD-10-CM | POA: Diagnosis not present

## 2017-10-12 ENCOUNTER — Ambulatory Visit (INDEPENDENT_AMBULATORY_CARE_PROVIDER_SITE_OTHER): Payer: Medicare Other

## 2017-10-12 VITALS — BP 124/70 | HR 79 | Temp 97.5°F | Ht 70.25 in | Wt 133.5 lb

## 2017-10-12 DIAGNOSIS — I739 Peripheral vascular disease, unspecified: Secondary | ICD-10-CM

## 2017-10-12 DIAGNOSIS — I2510 Atherosclerotic heart disease of native coronary artery without angina pectoris: Secondary | ICD-10-CM

## 2017-10-12 DIAGNOSIS — Z9861 Coronary angioplasty status: Secondary | ICD-10-CM | POA: Diagnosis not present

## 2017-10-12 DIAGNOSIS — I10 Essential (primary) hypertension: Secondary | ICD-10-CM

## 2017-10-12 DIAGNOSIS — Z0000 Encounter for general adult medical examination without abnormal findings: Secondary | ICD-10-CM

## 2017-10-12 LAB — HEPATIC FUNCTION PANEL
ALK PHOS: 79 U/L (ref 39–117)
ALT: 15 U/L (ref 0–53)
AST: 16 U/L (ref 0–37)
Albumin: 4.1 g/dL (ref 3.5–5.2)
BILIRUBIN DIRECT: 0.1 mg/dL (ref 0.0–0.3)
BILIRUBIN TOTAL: 0.5 mg/dL (ref 0.2–1.2)
TOTAL PROTEIN: 7.5 g/dL (ref 6.0–8.3)

## 2017-10-12 LAB — BASIC METABOLIC PANEL
BUN: 17 mg/dL (ref 6–23)
CO2: 29 mEq/L (ref 19–32)
CREATININE: 0.69 mg/dL (ref 0.40–1.50)
Calcium: 9.7 mg/dL (ref 8.4–10.5)
Chloride: 94 mEq/L — ABNORMAL LOW (ref 96–112)
GFR: 117.03 mL/min (ref >60.00)
Glucose, Bld: 76 mg/dL (ref 70–99)
Potassium: 5.5 mEq/L — ABNORMAL HIGH (ref 3.5–5.1)
Sodium: 127 mEq/L — ABNORMAL LOW (ref 135–145)

## 2017-10-12 LAB — CBC WITH DIFFERENTIAL/PLATELET
BASOS ABS: 0.1 10*3/uL (ref 0.0–0.1)
Basophils Relative: 0.6 % (ref 0.0–3.0)
Eosinophils Absolute: 0.1 10*3/uL (ref 0.0–0.7)
Eosinophils Relative: 1.2 % (ref 0.0–5.0)
HCT: 41.1 % (ref 39.0–52.0)
Hemoglobin: 14 g/dL (ref 13.0–17.0)
LYMPHS ABS: 1.2 10*3/uL (ref 0.7–4.0)
Lymphocytes Relative: 11.4 % — ABNORMAL LOW (ref 12.0–46.0)
MCHC: 34.1 g/dL (ref 30.0–36.0)
MCV: 102.2 fl — AB (ref 78.0–100.0)
MONO ABS: 1.2 10*3/uL — AB (ref 0.1–1.0)
MONOS PCT: 11 % (ref 3.0–12.0)
NEUTROS ABS: 8.2 10*3/uL — AB (ref 1.4–7.7)
NEUTROS PCT: 75.8 % (ref 43.0–77.0)
PLATELETS: 231 10*3/uL (ref 150.0–400.0)
RBC: 4.02 Mil/uL — ABNORMAL LOW (ref 4.22–5.81)
RDW: 13.5 % (ref 11.5–15.5)
WBC: 10.8 10*3/uL — ABNORMAL HIGH (ref 4.0–10.5)

## 2017-10-12 LAB — LIPID PANEL
Cholesterol: 96 mg/dL (ref 0–200)
HDL: 48.4 mg/dL (ref >39.00)
LDL CALC: 33 mg/dL (ref 0–99)
NonHDL: 47.79
TRIGLYCERIDES: 72 mg/dL (ref 0.0–149.0)
Total CHOL/HDL Ratio: 2
VLDL: 14.4 mg/dL (ref 0.0–40.0)

## 2017-10-12 NOTE — Progress Notes (Signed)
I reviewed health advisor's note, was available for consultation, and agree with documentation and plan.  

## 2017-10-12 NOTE — Progress Notes (Signed)
Subjective:   Patrick Sampson is a 83 y.o. male who presents for Medicare Annual/Subsequent preventive examination.  Review of Systems:  N/A Cardiac Risk Factors include: advanced age (>26men, >23 women);male gender;dyslipidemia;hypertension     Objective:    Vitals: BP 124/70 (BP Location: Right Arm)   Pulse 79   Temp (!) 97.5 F (36.4 C) (Oral)   Ht 5' 10.25" (1.784 m) Comment: shoes  Wt 133 lb 8 oz (60.6 kg)   SpO2 96%   BMI 19.02 kg/m   Body mass index is 19.02 kg/m.  Advanced Directives 10/12/2017 10/08/2016 04/09/2014 04/09/2014  Does Patient Have a Medical Advance Directive? Yes Yes Yes No  Type of Paramedic of [place];Living will [place];Living will [place];Living will -  Does patient want to make changes to medical advance directive? - - No - Patient declined -  Copy of [place] in Chart? No - copy requested No - copy requested No - copy requested -    Tobacco Social History   Tobacco Use  Smoking Status Former Smoker  . Years: 50.00  . Types: Cigarettes  . Last attempt to quit: 01/20/2007  . Years since quitting: 10.7  Smokeless Tobacco Never Used     Counseling given: No   Clinical Intake:  Pre-visit preparation completed: Yes  Pain : No/denies pain Pain Score: 0-No pain     Nutritional Status: BMI of 19-24  Normal Nutritional Risks: None Diabetes: No  How often do you need to have someone help you when you read instructions, pamphlets, or other written materials from your doctor or pharmacy?: 1 - Never What is the last grade level you completed in school?: Bachelor degree + 2 yrs grad school  Interpreter Needed?: No  Comments: pt lives with spouse Information entered by :: LPinson, LPN  Past Medical History:  Diagnosis Date  . AAA (abdominal aortic aneurysm) ([place])    a. Abdominal US 01/2011:  2.4 x 2.4 cm. Follow up recommended in 2 years.;  b. Abdominal US  (01/2013):  Infrarenal AAA 2.4 x 2.4 cm  . Allergic rhinitis due to pollen   . Arthritis   . CAD (coronary artery disease) 11/2008   a. Inf STEMI 11/2008 => Mid to dist LAD 70-75%, mid CFX occluded, oRCA 50%, mRCA 50%, inf AK, EF 50% => PCI: overlapping BMSs to CFX;  b.  F/u LHC 01/2009:  mid LAD 50%, mid CFX stents patent. There was 50-70% stenosis at the prox edge of the stent; IVUS of the CFX and LAD with no hemodynamically significant stenoses=>Med Rx.;  c.  ETT-Myoview 8/14:  Inf and IL scar, no ischemia, low risk  . Cancer of lung, upper lobe, Right, in remission 2000  . Cataract   . Complete heart block (HCC)    a. a/w syncope b. s/p STJ dual chamber pacemaker 03/2014  . COPD with emphysema ([place])   . Hyperlipidemia   . Prostate cancer ([place]) 2000  . Raynauds disease   . Tinnitus of both ears    Past Surgical History:  Procedure Laterality Date  . ANGIOPLASTY / STENTING FEMORAL    . CATARACT EXTRACTION, BILATERAL    . COLONOSCOPY  2007  . LUNG LOBECTOMY Right 2000   Right upper lobectomy  . PERMANENT PACEMAKER INSERTION N/A 04/09/2014   Procedure: Daneil Dan;  Surgeon: Peter M Martinique, MD;  Location: [facility] CATH LAB;  Service: Cardiovascular;  Laterality: N/A;  . PERMANENT PACEMAKER  INSERTION N/A 04/10/2014   STJ dual chamber pacemaker implanted by Dr Rayann Heman for CHB  . POLYPECTOMY    . TRANSPERINEAL IMPLANT OF RADIATION SEEDS W/ ULTRASOUND  2000   for cancer,rad.with seed plantation   Family History  Problem Relation Age of Onset  . Colon cancer Sister 70       rectal cancer  . Lung cancer Father 69       smoker and coal mining  . Congestive Heart Failure Mother   . CAD Brother   . Tuberculosis Brother   . Kidney failure Brother        ESRD on dialysis  . Coronary artery disease Brother        s/p CABG   Social History   Socioeconomic History  . Marital status: Married    Spouse name: Not on file  . Number of children: Not on file  . Years of education: Not on file  .  Highest education level: Not on file  Occupational History  . Not on file  Social Needs  . Financial resource strain: Not on file  . Food insecurity:    Worry: Not on file    Inability: Not on file  . Transportation needs:    Medical: Not on file    Non-medical: Not on file  Tobacco Use  . Smoking status: Former Smoker    Years: 50.00    Types: Cigarettes    Last attempt to quit: 01/20/2007    Years since quitting: 10.7  . Smokeless tobacco: Never Used  Substance and Sexual Activity  . Alcohol use: Yes    Alcohol/week: 10.0 standard drinks    Types: 10 Cans of beer per week    Comment: moderate  . Drug use: No  . Sexual activity: Never  Lifestyle  . Physical activity:    Days per week: Not on file    Minutes per session: Not on file  . Stress: Not on file  Relationships  . Social connections:    Talks on phone: Not on file    Gets together: Not on file    Attends religious service: Not on file    Active member of club or organization: Not on file    Attends meetings of clubs or organizations: Not on file    Relationship status: Not on file  Other Topics Concern  . Not on file  Social History Narrative  . Not on file    Outpatient Encounter Medications as of 10/12/2017  Medication Sig  . aspirin EC 81 MG EC tablet Take 1 tablet (81 mg total) by mouth daily.  Marland Kitchen BREO ELLIPTA 200-25 MCG/INH AEPB INHALE 1 PUFF BY MOUTH ONCE DAILY  . lisinopril (PRINIVIL,ZESTRIL) 2.5 MG tablet TAKE 1 TABLET BY MOUTH ONCE DAILY  . Lutein 10 MG TABS Take 10 mg by mouth 2 (two) times daily.    . metoprolol tartrate (LOPRESSOR) 25 MG tablet TAKE 1 TABLET BY MOUTH TWICE DAILY  . nitroGLYCERIN (NITROSTAT) 0.4 MG SL tablet Place 1 tablet (0.4 mg total) under the tongue every 5 (five) minutes as needed for chest pain.  . rosuvastatin (CRESTOR) 40 MG tablet TAKE ONE-HALF TABLET BY MOUTH ONCE DAILY  . VENTOLIN HFA 108 (90 Base) MCG/ACT inhaler INHALE 1 TO 2 PUFFS BY MOUTH EVERY 6 HOURS AS NEEDED FOR  WHEEZING AND FOR SHORTNESS OF BREATH   No facility-administered encounter medications on file as of 10/12/2017.     Activities of Daily Living In your present state  of health, do you have any difficulty performing the following activities: 10/12/2017  Hearing? N  Vision? Y  Difficulty concentrating or making decisions? N  Walking or climbing stairs? Y  Dressing or bathing? N  Doing errands, shopping? N  Preparing Food and eating ? N  Using the Toilet? N  In the past six months, have you accidently leaked urine? N  Do you have problems with loss of bowel control? N  Managing your Medications? N  Managing your Finances? N  Housekeeping or managing your Housekeeping? N  Some recent data might be hidden    Patient Care Team: Owens Loffler, MD as PCP - General Patsey Berthold, NP as Nurse Practitioner (Cardiology)   Assessment:   This is a routine wellness examination for Donte.   Hearing Screening   125Hz  250Hz  500Hz  1000Hz  2000Hz  3000Hz  4000Hz  6000Hz  8000Hz   Right ear:   40 40 40  0    Left ear:   40 40 40  0    Vision Screening Comments: Eye injections every 5 weeks due to macular degeneration; Dr. Zadie Rhine    Exercise Activities and Dietary recommendations Current Exercise Habits: Home exercise routine, Type of exercise: Other - see comments(stationary bike), Time (Minutes): 60, Frequency (Times/Week): 7, Weekly Exercise (Minutes/Week): 420, Intensity: Moderate, Exercise limited by: None identified  Goals    . Increase physical activity     Starting 10/12/2017, I will continue to exercise for at least 60 minutes daily.        Fall Risk Fall Risk  10/12/2017 10/08/2016 07/31/2015 07/30/2014 03/01/2013  Falls in the past year? No No No Yes No  Number falls in past yr: - - - 1 -  Injury with Fall? - - - Yes -   Depression Screen PHQ 2/9 Scores 10/12/2017 10/08/2016 07/31/2015 07/30/2014  PHQ - 2 Score 0 0 0 0  PHQ- 9 Score 0 0 - -    Cognitive Function MMSE - Mini Mental  State Exam 10/12/2017 10/08/2016  Orientation to time 5 5  Orientation to Place 5 5  Registration 3 3  Attention/ Calculation 0 0  Recall 3 3  Language- name 2 objects 0 0  Language- repeat 1 1  Language- follow 3 step command 3 3  Language- read & follow direction 0 0  Write a sentence 0 0  Copy design 0 0  Total score 20 20     PLEASE NOTE: A Mini-Cog screen was completed. Maximum score is 20. A value of 0 denotes this part of Folstein MMSE was not completed or the patient failed this part of the Mini-Cog screening.   Mini-Cog Screening Orientation to Time - Max 5 pts Orientation to Place - Max 5 pts Registration - Max 3 pts Recall - Max 3 pts Language Repeat - Max 1 pts Language Follow 3 Step Command - Max 3 pts     Immunization History  Administered Date(s) Administered  . Influenza Split 10/14/2012  . Influenza Whole 01/19/2005  . Influenza, High Dose Seasonal PF 09/26/2014, 10/03/2015, 10/08/2016, 10/06/2017  . Influenza-Unspecified 10/11/2013  . Pneumococcal Conjugate-13 03/01/2013  . Pneumococcal Polysaccharide-23 01/19/2002, 07/20/2008  . Td 01/19/1997, 07/11/2007  . Zoster 10/20/2006  . Zoster Recombinat (Shingrix) 04/05/2017, 07/18/2017    Screening Tests Health Maintenance  Topic Date Due  . TETANUS/TDAP  01/19/2019 (Originally 07/10/2017)  . INFLUENZA VACCINE  Completed  . PNA vac Low Risk Adult  Completed     Plan:     I have personally  reviewed, addressed, and noted the following in the patient's chart:  A. Medical and social history B. Use of alcohol, tobacco or illicit drugs  C. Current medications and supplements D. Functional ability and status E.  Nutritional status F.  Physical activity G. Advance directives H. List of other physicians I.  Hospitalizations, surgeries, and ER visits in previous 12 months J.  [place] to include hearing, vision, cognitive, depression L. Referrals and appointments - none  In addition, I have  reviewed and discussed with patient certain preventive protocols, quality metrics, and best practice recommendations. A written personalized care plan for preventive services as well as general preventive health recommendations were provided to patient.  See attached scanned questionnaire for additional information.   Signed,   Lindell Noe, MHA, BS, LPN Health Coach

## 2017-10-12 NOTE — Progress Notes (Signed)
PCP notes:   Health maintenance:  Tetanus vaccine - postponed/insurance  Abnormal screenings:   Hearing - failed  Hearing Screening   125Hz  250Hz  500Hz  1000Hz  2000Hz  3000Hz  4000Hz  6000Hz  8000Hz   Right ear:   40 40 40  0    Left ear:   40 40 40  0     Patient concerns:   None  Nurse concerns:  None  Next PCP appt:   10/18/2017 @ 1440

## 2017-10-12 NOTE — Patient Instructions (Signed)
[name] , Thank you for taking time to come for your Medicare Wellness Visit. I appreciate your ongoing commitment to your health goals. Please review the following plan we discussed and let me know if I can assist you in the future.   These are the goals we discussed: Goals    . Increase physical activity     Starting 10/12/2017, I will continue to exercise for at least 60 minutes daily.        This is a list of the screening recommended for you and due dates:  Health Maintenance  Topic Date Due  . Tetanus Vaccine  01/19/2019*  . Flu Shot  Completed  . Pneumonia vaccines  Completed  *Topic was postponed. The date shown is not the original due date.   Preventive Care for Adults  A healthy lifestyle and preventive care can promote health and wellness. Preventive health guidelines for adults include the following key practices.  . A routine yearly physical is a good way to check with your health care provider about your health and preventive screening. It is a chance to share any concerns and updates on your health and to receive a thorough exam.  . Visit your dentist for a routine exam and preventive care every 6 months. Brush your teeth twice a day and floss once a day. Good oral hygiene prevents tooth decay and gum disease.  . The frequency of eye exams is based on your age, health, family medical history, use  of contact lenses, and other factors. Follow your health care provider's recommendations for frequency of eye exams.  . Eat a healthy diet. Foods like vegetables, fruits, whole grains, low-fat dairy products, and lean protein foods contain the nutrients you need without too many calories. Decrease your intake of foods high in solid fats, added sugars, and salt. Eat the right amount of calories for you. Get information about a proper diet from your health care provider, if necessary.  . Regular physical exercise is one of the most important things you can do for your health.  Most adults should get at least 150 minutes of moderate-intensity exercise (any activity that increases your heart rate and causes you to sweat) each week. In addition, most adults need muscle-strengthening exercises on 2 or more days a week.  Silver Sneakers may be a benefit available to you. To determine eligibility, you may visit the website: www.silversneakers.com or contact program at 618-320-5077 Mon-Fri between 8AM-8PM.   . Maintain a healthy weight. The body mass index (BMI) is a screening tool to identify possible weight problems. It provides an estimate of body fat based on height and weight. Your health care provider can find your BMI and can help you achieve or maintain a healthy weight.   For adults 20 years and older: ? A BMI below 18.5 is considered underweight. ? A BMI of 18.5 to 24.9 is normal. ? A BMI of 25 to 29.9 is considered overweight. ? A BMI of 30 and above is considered obese.   . Maintain normal blood lipids and cholesterol levels by exercising and minimizing your intake of saturated fat. Eat a balanced diet with plenty of fruit and vegetables. Blood tests for lipids and cholesterol should begin at age 34 and be repeated every 5 years. If your lipid or cholesterol levels are high, you are over 50, or you are at high risk for heart disease, you may need your cholesterol levels checked more frequently. Ongoing high lipid and cholesterol levels should  be treated with medicines if diet and exercise are not working.  . If you smoke, find out from your health care provider how to quit. If you do not use tobacco, please do not start.  . If you choose to drink alcohol, please do not consume more than 2 drinks per day. One drink is considered to be 12 ounces (355 mL) of beer, 5 ounces (148 mL) of wine, or 1.5 ounces (44 mL) of liquor.  . If you are 19-20 years old, ask your health care provider if you should take aspirin to prevent strokes.  . Use sunscreen. Apply sunscreen  liberally and repeatedly throughout the day. You should seek shade when your shadow is shorter than you. Protect yourself by wearing long sleeves, pants, a wide-brimmed hat, and sunglasses year round, whenever you are outdoors.  . Once a month, do a whole body skin exam, using a mirror to look at the skin on your back. Tell your health care provider of new moles, moles that have irregular borders, moles that are larger than a pencil eraser, or moles that have changed in shape or color.

## 2017-10-13 ENCOUNTER — Other Ambulatory Visit: Payer: Self-pay | Admitting: *Deleted

## 2017-10-13 MED ORDER — VENTOLIN HFA 108 (90 BASE) MCG/ACT IN AERS: INHALATION_SPRAY | RESPIRATORY_TRACT | 3 refills | Status: DC

## 2017-10-15 ENCOUNTER — Ambulatory Visit (INDEPENDENT_AMBULATORY_CARE_PROVIDER_SITE_OTHER): Payer: Medicare Other | Admitting: Internal Medicine

## 2017-10-15 ENCOUNTER — Encounter: Payer: Self-pay | Admitting: Internal Medicine

## 2017-10-15 VITALS — BP 116/68 | HR 70 | Ht 71.0 in | Wt 131.8 lb

## 2017-10-15 DIAGNOSIS — I2510 Atherosclerotic heart disease of native coronary artery without angina pectoris: Secondary | ICD-10-CM

## 2017-10-15 DIAGNOSIS — I442 Atrioventricular block, complete: Secondary | ICD-10-CM | POA: Diagnosis not present

## 2017-10-15 DIAGNOSIS — Z950 Presence of cardiac pacemaker: Secondary | ICD-10-CM

## 2017-10-15 DIAGNOSIS — Z9861 Coronary angioplasty status: Secondary | ICD-10-CM | POA: Diagnosis not present

## 2017-10-15 DIAGNOSIS — I10 Essential (primary) hypertension: Secondary | ICD-10-CM

## 2017-10-15 NOTE — Patient Instructions (Addendum)
Medication Instructions:  Your physician recommends that you continue on your current medications as directed. Please refer to the Current Medication list given to you today.  Labwork: None ordered.  Testing/Procedures: None ordered.  Follow-Up: Your physician wants you to follow-up in: one year with Tommye Standard, PA.   You will receive a reminder letter in the mail two months in advance. If you don't receive a letter, please call our office to schedule the follow-up appointment.  Remote monitoring is used to monitor your Pacemaker from home. This monitoring reduces the number of office visits required to check your device to one time per year. It allows [place] to keep an eye on the functioning of your device to ensure it is working properly. You are scheduled for a device check from home on 01/03/2018. You may send your transmission at any time that day. If you have a wireless device, the transmission will be sent automatically. After your physician reviews your transmission, you will receive a postcard with your next transmission date.  Any Other Special Instructions Will Be Listed Below (If Applicable).  If you need a refill on your cardiac medications before your next appointment, please call your pharmacy.

## 2017-10-15 NOTE — Progress Notes (Signed)
PCP: Owens Loffler, MD Primary Cardiologist: Dr Stanford Breed Primary EP:  Dr Rayann Heman  Patrick Sampson is a 83 y.o. male who presents today for routine electrophysiology followup.  Since last being seen in our clinic, the patient reports doing very well.  Today, he denies symptoms of palpitations, chest pain, shortness of breath,  lower extremity edema, dizziness, presyncope, or syncope.  The patient is otherwise without complaint today.   Past Medical History:  Diagnosis Date  . AAA (abdominal aortic aneurysm) ([place])    a. Abdominal US 01/2011:  2.4 x 2.4 cm. Follow up recommended in 2 years.;  b. Abdominal US (01/2013):  Infrarenal AAA 2.4 x 2.4 cm  . Allergic rhinitis due to pollen   . Arthritis   . CAD (coronary artery disease) 11/2008   a. Inf STEMI 11/2008 => Mid to dist LAD 70-75%, mid CFX occluded, oRCA 50%, mRCA 50%, inf AK, EF 50% => PCI: overlapping BMSs to CFX;  b.  F/u LHC 01/2009:  mid LAD 50%, mid CFX stents patent. There was 50-70% stenosis at the prox edge of the stent; IVUS of the CFX and LAD with no hemodynamically significant stenoses=>Med Rx.;  c.  ETT-Myoview 8/14:  Inf and IL scar, no ischemia, low risk  . Cancer of lung, upper lobe, Right, in remission 2000  . Cataract   . Complete heart block (HCC)    a. a/w syncope b. s/p STJ dual chamber pacemaker 03/2014  . COPD with emphysema ([place])   . Hyperlipidemia   . Prostate cancer ([place]) 2000  . Raynauds disease   . Tinnitus of both ears    Past Surgical History:  Procedure Laterality Date  . ANGIOPLASTY / STENTING FEMORAL    . CATARACT EXTRACTION, BILATERAL    . COLONOSCOPY  2007  . LUNG LOBECTOMY Right 2000   Right upper lobectomy  . PERMANENT PACEMAKER INSERTION N/A 04/09/2014   Procedure: Daneil Dan;  Surgeon: Peter M Martinique, MD;  Location[facility] C[facility] LAB;  Service: Cardiovascular;  Laterality: N/A;  . PERMANENT PACEMAKER INSERTION N/A 04/10/2014   STJ dual chamber pacemaker implanted by Dr Rayann Heman for CHB  . POLYPECTOMY     . TRANSPERINEAL IMPLANT OF RADIATION SEEDS W/ ULTRASOUND  2000   for cancer,rad.with seed plantation    ROS- all systems are reviewed and negative except as per HPI above  Current Outpatient Medications  Medication Sig Dispense Refill  . aspirin EC 81 MG EC tablet Take 1 tablet (81 mg total) by mouth daily. 30 tablet 0  . BREO ELLIPTA 200-25 MCG/INH AEPB INHALE 1 PUFF BY MOUTH ONCE DAILY 180 each 0  . lisinopril (PRINIVIL,ZESTRIL) 2.5 MG tablet TAKE 1 TABLET BY MOUTH ONCE DAILY 90 tablet 3  . Lutein 10 MG TABS Take 10 mg by mouth 2 (two) times daily.      . metoprolol tartrate (LOPRESSOR) 25 MG tablet TAKE 1 TABLET BY MOUTH TWICE DAILY 180 tablet 2  . nitroGLYCERIN (NITROSTAT) 0.4 MG SL tablet Place 1 tablet (0.4 mg total) under the tongue every 5 (five) minutes as needed for chest pain. 25 tablet 12  . rosuvastatin (CRESTOR) 40 MG tablet TAKE ONE-HALF TABLET BY MOUTH ONCE DAILY 45 tablet 3  . VENTOLIN HFA 108 (90 Base) MCG/ACT inhaler INHALE 1 TO 2 PUFFS BY MOUTH EVERY 6 HOURS AS NEEDED FOR WHEEZING AND FOR SHORTNESS OF BREATH 18 g 3   No current facility-administered medications for this visit.     Physical Exam: Vitals:  10/15/17 1146  BP: 116/68  Pulse: 70  SpO2: 98%  Weight: 131 lb 12.8 oz (59.8 kg)  Height: 5\' 11"  (1.803 m)    GEN- The patient is well appearing, alert and oriented x 3 today.   Head- normocephalic, atraumatic Eyes-  Sclera clear, conjunctiva pink Ears- hearing intact Oropharynx- clear Lungs- Clear to ausculation bilaterally, normal work of breathing Chest- pacemaker pocket is well healed Heart- Regular rate and rhythm, no murmurs, rubs or gallops, PMI not laterally displaced GI- soft, NT, ND, + BS Extremities- no clubbing, cyanosis, or edema  Pacemaker interrogation- reviewed in detail today,  See PACEART report  ekg tracing ordered today is personally reviewed and shows sinus with V pacing  Assessment and Plan:  1. Symptomatic complete heart  block Normal pacemaker function See Pace Art report No changes today  2. CAD No ischemic symptoms  3. HTN Stable No change required today  Merlin Return to see EP PA ever year Follow-up with Dr Stanford Breed as scheduled  Thompson Grayer MD, [facility] 10/15/2017 11:55 AM

## 2017-10-18 ENCOUNTER — Ambulatory Visit (INDEPENDENT_AMBULATORY_CARE_PROVIDER_SITE_OTHER): Payer: Medicare Other | Admitting: Family Medicine

## 2017-10-18 ENCOUNTER — Encounter: Payer: Self-pay | Admitting: Family Medicine

## 2017-10-18 VITALS — BP 100/60 | HR 68 | Temp 97.7°F | Ht 70.25 in | Wt 134.5 lb

## 2017-10-18 DIAGNOSIS — E875 Hyperkalemia: Secondary | ICD-10-CM

## 2017-10-18 DIAGNOSIS — I10 Essential (primary) hypertension: Secondary | ICD-10-CM | POA: Diagnosis not present

## 2017-10-18 DIAGNOSIS — H3530 Unspecified macular degeneration: Secondary | ICD-10-CM | POA: Insufficient documentation

## 2017-10-18 DIAGNOSIS — Z9861 Coronary angioplasty status: Secondary | ICD-10-CM

## 2017-10-18 DIAGNOSIS — I2510 Atherosclerotic heart disease of native coronary artery without angina pectoris: Secondary | ICD-10-CM

## 2017-10-18 DIAGNOSIS — C61 Malignant neoplasm of prostate: Secondary | ICD-10-CM | POA: Diagnosis not present

## 2017-10-18 DIAGNOSIS — J438 Other emphysema: Secondary | ICD-10-CM | POA: Diagnosis not present

## 2017-10-18 HISTORY — DX: Unspecified macular degeneration: H35.30

## 2017-10-18 LAB — CUP PACEART INCLINIC DEVICE CHECK
Brady Statistic RV Percent Paced: 99.94 %
Implantable Lead Implant Date: 20160322
Implantable Lead Location: 753859
Lead Channel Pacing Threshold Amplitude: 0.75 V
Lead Channel Pacing Threshold Pulse Width: 0.4 ms
Lead Channel Sensing Intrinsic Amplitude: 12 mV
Lead Channel Sensing Intrinsic Amplitude: 4 mV
Lead Channel Setting Pacing Pulse Width: 0.4 ms
MDC IDC LEAD IMPLANT DT: 20160322
MDC IDC LEAD LOCATION: 753860
MDC IDC MSMT BATTERY REMAINING LONGEVITY: 135 mo
MDC IDC MSMT BATTERY VOLTAGE: 2.99 V
MDC IDC MSMT LEADCHNL RA IMPEDANCE VALUE: 425 Ohm
MDC IDC MSMT LEADCHNL RV IMPEDANCE VALUE: 800 Ohm
MDC IDC MSMT LEADCHNL RV PACING THRESHOLD AMPLITUDE: 0.75 V
MDC IDC MSMT LEADCHNL RV PACING THRESHOLD PULSEWIDTH: 0.4 ms
MDC IDC PG IMPLANT DT: 20160322
MDC IDC PG SERIAL: 7731685
MDC IDC SESS DTM: 20190927154507
MDC IDC SET LEADCHNL RA PACING AMPLITUDE: 2 V
MDC IDC SET LEADCHNL RV PACING AMPLITUDE: 0.875
MDC IDC SET LEADCHNL RV SENSING SENSITIVITY: 2.5 mV
MDC IDC STAT BRADY RA PERCENT PACED: 0.31 %
Pulse Gen Model: 2240

## 2017-10-18 NOTE — Progress Notes (Signed)
Dr. Frederico Hamman T. Celesta Funderburk, MD, [facility] [name], [address] Phone: [phone] Fax: [phone]  10/18/2017  Patient: Patrick Sampson, MRN: [id number], DOB: September 04, 1934, 83 y.o.  Primary Physician:  Owens Loffler, MD   Chief Complaint  Patient presents with  . Annual Exam    Part 2   Subjective:   Patrick Sampson is a 83 y.o. very pleasant male patient who presents with the following:  F/u mutiple med problems.  Macular degneration, R and to a lesser extent the L.   Recheck bmp Rest of labs look good  He has h/o prostate and lung cancer, CAD, s/p pacemaker.  HTN: Tolerating all medications without side effects Stable and at goal No CP, no sob. No HA.  BP Readings from Last 3 Encounters:  10/18/17 100/60  10/15/17 116/68  10/12/17 248/25    Basic Metabolic Panel:    Component Value Date/Time   NA 127 (L) 10/12/2017 1159   K 5.5 No hemolysis seen.. (H) 10/12/2017 1159   CL 94 (L) 10/12/2017 1159   CO2 29 10/12/2017 1159   BUN 17 10/12/2017 1159   CREATININE 0.69 10/12/2017 1159   CREATININE 0.89 11/22/2013 0845   GLUCOSE 76 10/12/2017 1159   CALCIUM 9.7 10/12/2017 1159    Lipids: Doing well, stable. Tolerating meds fine with no SE. Panel reviewed with patient.  Lipids:    Component Value Date/Time   CHOL 96 10/12/2017 1159   TRIG 72.0 10/12/2017 1159   HDL 48.40 10/12/2017 1159   LDLDIRECT 50.8 08/14/2009 1057   VLDL 14.4 10/12/2017 1159   CHOLHDL 2 10/12/2017 1159    Lab Results  Component Value Date   ALT 15 10/12/2017   AST 16 10/12/2017   ALKPHOS 79 10/12/2017   BILITOT 0.5 10/12/2017     Past Medical History, Surgical History, Social History, Family History, Problem List, Medications, and Allergies have been reviewed and updated if relevant.  Patient Active Problem List   Diagnosis Date Noted  . Complete heart block ([place]) 04/09/2014    Priority: High  . STEMI, s/p PCI  11/2008 12/02/2012    Priority: High  . moderate COPD with emphysema 02/24/2012    Priority: High  . CAD S/P CFX BMS 2010 12/19/2008    Priority: High  . Cancer of lung, upper lobe, Right, in remission 08/16/2006    Priority: High  . Prostate cancer, in Remission, 12/1998 Seeds 08/16/2006    Priority: High  . Raynauds disease     Priority: Medium  . Essential hypertension 02/12/2010    Priority: Medium  . HYPERLIPIDEMIA-MIXED 12/19/2008    Priority: Medium  . PERIPHERAL VASCULAR DISEASE 08/16/2006    Priority: Medium  . Status post placement of cardiac pacemaker 03/15/2015  . RBBB 04/09/2014  . Aortic calcification ([place]) 12/02/2012  . Allergic rhinitis due to pollen   . HEMORRHOIDS 03/19/2010  . COLONIC POLYPS, ADENOMATOUS, HX OF 03/19/2010  . Tobacco abuse, in remission 12/19/2008  . AAA- 2.4 x 2/4 07/11/2007    Past Medical History:  Diagnosis Date  . AAA (abdominal aortic aneurysm) ([place])    a. Abdominal US 01/2011:  2.4 x 2.4 cm. Follow up recommended in 2 years.;  b. Abdominal US (01/2013):  Infrarenal AAA 2.4 x 2.4 cm  . Allergic rhinitis due to pollen   . Arthritis   . CAD (coronary artery disease) 11/2008   a. Inf STEMI 11/2008 => Mid to dist LAD  70-75%, mid CFX occluded, oRCA 50%, mRCA 50%, inf AK, EF 50% => PCI: overlapping BMSs to CFX;  b.  F/u LHC 01/2009:  mid LAD 50%, mid CFX stents patent. There was 50-70% stenosis at the prox edge of the stent; IVUS of the CFX and LAD with no hemodynamically significant stenoses=>Med Rx.;  c.  ETT-Myoview 8/14:  Inf and IL scar, no ischemia, low risk  . Cancer of lung, upper lobe, Right, in remission 2000  . Cataract   . Complete heart block (H[facility])    a. a/w syncope b. s/p STJ dual chamber pacemaker 03/2014  . COPD with emphysema ([place])   . Hyperlipidemia   . Prostate cancer ([place]) 2000  . Raynauds disease   . Tinnitus of both ears     Past Surgical History:  Procedure Laterality Date  . ANGIOPLASTY / STENTING FEMORAL    .  CATARACT EXTRACTION, BILATERAL    . COLONOSCOPY  2007  . LUNG LOBECTOMY Right 2000   Right upper lobectomy  . PERMANENT PACEMAKER INSERTION N/A 04/09/2014   Procedure: Daneil Dan;  Surgeon: Peter M Martinique, MD;  Location: [facility] CATH LAB;  Service: Cardiovascular;  Laterality: N/A;  . PERMANENT PACEMAKER INSERTION N/A 04/10/2014   STJ dual chamber pacemaker implanted by Dr Rayann Heman for CHB  . POLYPECTOMY    . 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 Marital status: Married    Spouse name: Not on file  . Number of children: Not on file  . Years of education: Not on file  . Highest education level: Not on file  Occupational History  . Not on file  Social Needs  . Financial resource strain: Not on file  . Food insecurity:    Worry: Not on file    Inability: Not on file  . Transportation needs:    Medical: Not on file    Non-medical: Not on file  Tobacco Use  . Smoking status: Former Smoker    Years: 50.00    Types: Cigarettes    Last attempt to quit: 01/20/2007    Years since quitting: 10.7  . Smokeless tobacco: Never Used  Substance and Sexual Activity  . Alcohol use: Yes    Alcohol/week: 10.0 standard drinks    Types: 10 Cans of beer per week    Comment: moderate  . Drug use: No  . Sexual activity: Never  Lifestyle  . Physical activity:    Days per week: Not on file    Minutes per session: Not on file  . Stress: Not on file  Relationships  . Social connections:    Talks on phone: Not on file    Gets together: Not on file    Attends religious service: Not on file    Active member of club or organization: Not on file    Attends meetings of clubs or organizations: Not on file    Relationship status: Not on file  . Intimate partner violence:    Fear of current or ex partner: Not on file    Emotionally abused: Not on file    Physically abused: Not on file    Forced sexual  activity: Not on file  Other Topics Concern  . Not on file  Social History Narrative  . Not on file    Family History  Problem Relation Age of Onset  . Colon cancer Sister 23  rectal cancer  . Lung cancer Father 53       smoker and coal mining  . Congestive Heart Failure Mother   . CAD Brother   . Tuberculosis Brother   . Kidney failure Brother        ESRD on dialysis  . Coronary artery disease Brother        s/p CABG    Allergies  Allergen Reactions  . Amoxicillin Rash    Medication list reviewed and updated in full in [place].   GEN: No acute illnesses, no fevers, chills. GI: No n/v/d, eating normally Pulm: No SOB Interactive and getting along well at home.  Otherwise, ROS is as per the HPI.  Objective:   BP 100/60   Pulse 68   Temp 97.7 F (36.5 C) (Oral)   Ht 5' 10.25" (1.784 m)   Wt 134 lb 8 oz (61 kg)   BMI 19.16 kg/m   GEN: WDWN, NAD, Non-toxic, A & O x 3 HEENT: Atraumatic, Normocephalic. Neck supple. No masses, No LAD. Ears and Nose: No external deformity. CV: RRR, No M/G/R. No JVD. No thrill. No extra heart sounds. PULM: CTA B, no wheezes, crackles, rhonchi. No retractions. No resp. distress. No accessory muscle use. EXTR: No c/c/e NEURO Normal gait.  PSYCH: Normally interactive. Conversant. Not depressed or anxious appearing.  Calm demeanor.   Laboratory and Imaging Data:  Assessment and Plan:   Hyperkalemia - Plan: Basic metabolic panel  Prostate cancer, in Remission, 12/1998 Seeds - Plan: PSA, total and free  CAD S/P CFX BMS 2010  Other emphysema ([place])  Essential hypertension  Macular degeneration (senile) of retina  Recheck BMP  He is doing well overall.   Follow-up: 1 yr  Orders Placed This Encounter  Procedures  . Basic metabolic panel  . PSA, total and free    Signed,  Zollie Ellery T. Zaeda Mcferran, MD   Allergies as of 10/18/2017      Reactions   Amoxicillin Rash      Medication List        Accurate  as of 10/18/17  5:06 PM. Always use your most recent med list.          aspirin 81 MG EC tablet Take 1 tablet (81 mg total) by mouth daily.   BREO ELLIPTA 200-25 MCG/INH Aepb Generic drug:  fluticasone furoate-vilanterol INHALE 1 PUFF BY MOUTH ONCE DAILY   lisinopril 2.5 MG tablet Commonly known as:  PRINIVIL,ZESTRIL TAKE 1 TABLET BY MOUTH ONCE DAILY   Lutein 10 MG Tabs Take 10 mg by mouth 2 (two) times daily.   metoprolol tartrate 25 MG tablet Commonly known as:  LOPRESSOR TAKE 1 TABLET BY MOUTH TWICE DAILY   nitroGLYCERIN 0.4 MG SL tablet Commonly known as:  NITROSTAT Place 1 tablet (0.4 mg total) under the tongue every 5 (five) minutes as needed for chest pain.   rosuvastatin 40 MG tablet Commonly known as:  CRESTOR TAKE ONE-HALF TABLET BY MOUTH ONCE DAILY   VENTOLIN HFA 108 (90 Base) MCG/ACT inhaler Generic drug:  albuterol INHALE 1 TO 2 PUFFS BY MOUTH EVERY 6 HOURS AS NEEDED FOR WHEEZING AND FOR SHORTNESS OF BREATH

## 2017-10-19 ENCOUNTER — Other Ambulatory Visit (INDEPENDENT_AMBULATORY_CARE_PROVIDER_SITE_OTHER): Payer: Medicare Other

## 2017-10-19 DIAGNOSIS — E875 Hyperkalemia: Secondary | ICD-10-CM

## 2017-10-19 DIAGNOSIS — C61 Malignant neoplasm of prostate: Secondary | ICD-10-CM | POA: Diagnosis not present

## 2017-10-19 LAB — BASIC METABOLIC PANEL
BUN: 13 mg/dL (ref 6–23)
CHLORIDE: 95 meq/L — AB (ref 96–112)
CO2: 27 mEq/L (ref 19–32)
CREATININE: 0.78 mg/dL (ref 0.40–1.50)
Calcium: 9.8 mg/dL (ref 8.4–10.5)
GFR: 101.59 mL/min (ref >60.00)
Glucose, Bld: 162 mg/dL — ABNORMAL HIGH (ref 70–99)
POTASSIUM: 5.2 meq/L — AB (ref 3.5–5.1)
Sodium: 129 mEq/L — ABNORMAL LOW (ref 135–145)

## 2017-10-21 LAB — PSA, TOTAL AND FREE
PSA, % Free: UNDETERMINED % (calc) (ref >25)
PSA, Free: <0.1 ng/mL
PSA, Total: <0.1 ng/mL (ref <=4.0)

## 2017-11-02 LAB — CUP PACEART REMOTE DEVICE CHECK
Battery Remaining Percentage: 95.5 %
Battery Voltage: 2.99 V
Brady Statistic AP VP Percent: 1 %
Brady Statistic AP VS Percent: 1 %
Brady Statistic RV Percent Paced: 99 %
Date Time Interrogation Session: 20190916075721
Implantable Lead Implant Date: 20160322
Implantable Lead Implant Date: 20160322
Implantable Lead Location: 753860
Implantable Lead Model: 1948
Implantable Pulse Generator Implant Date: 20160322
Lead Channel Impedance Value: 750 Ohm
Lead Channel Pacing Threshold Amplitude: 0.5 V
Lead Channel Sensing Intrinsic Amplitude: 0.8 mV
Lead Channel Setting Pacing Amplitude: 0.75 V
Lead Channel Setting Pacing Pulse Width: 0.4 ms
Lead Channel Setting Sensing Sensitivity: 2.5 mV
MDC IDC LEAD LOCATION: 753859
MDC IDC MSMT BATTERY REMAINING LONGEVITY: 131 mo
MDC IDC MSMT LEADCHNL RA IMPEDANCE VALUE: 390 Ohm
MDC IDC MSMT LEADCHNL RA PACING THRESHOLD AMPLITUDE: 0.75 V
MDC IDC MSMT LEADCHNL RA PACING THRESHOLD PULSEWIDTH: 0.4 ms
MDC IDC MSMT LEADCHNL RV PACING THRESHOLD PULSEWIDTH: 0.4 ms
MDC IDC MSMT LEADCHNL RV SENSING INTR AMPL: 12 mV
MDC IDC SET LEADCHNL RA PACING AMPLITUDE: 2 V
MDC IDC STAT BRADY AS VP PERCENT: 99 %
MDC IDC STAT BRADY AS VS PERCENT: 1 %
MDC IDC STAT BRADY RA PERCENT PACED: 1 %
Pulse Gen Model: 2240
Pulse Gen Serial Number: 7731685

## 2017-11-15 ENCOUNTER — Other Ambulatory Visit: Payer: Self-pay | Admitting: Family Medicine

## 2017-11-15 DIAGNOSIS — H353132 Nonexudative age-related macular degeneration, bilateral, intermediate dry stage: Secondary | ICD-10-CM | POA: Diagnosis not present

## 2017-11-15 DIAGNOSIS — H43811 Vitreous degeneration, right eye: Secondary | ICD-10-CM | POA: Diagnosis not present

## 2017-11-15 DIAGNOSIS — H353211 Exudative age-related macular degeneration, right eye, with active choroidal neovascularization: Secondary | ICD-10-CM | POA: Diagnosis not present

## 2017-11-22 ENCOUNTER — Other Ambulatory Visit: Payer: Self-pay | Admitting: Cardiology

## 2017-12-27 DIAGNOSIS — H43811 Vitreous degeneration, right eye: Secondary | ICD-10-CM | POA: Diagnosis not present

## 2017-12-27 DIAGNOSIS — H353211 Exudative age-related macular degeneration, right eye, with active choroidal neovascularization: Secondary | ICD-10-CM | POA: Diagnosis not present

## 2017-12-27 DIAGNOSIS — H353132 Nonexudative age-related macular degeneration, bilateral, intermediate dry stage: Secondary | ICD-10-CM | POA: Diagnosis not present

## 2018-01-03 ENCOUNTER — Ambulatory Visit (INDEPENDENT_AMBULATORY_CARE_PROVIDER_SITE_OTHER): Payer: Medicare Other

## 2018-01-03 DIAGNOSIS — I442 Atrioventricular block, complete: Secondary | ICD-10-CM | POA: Diagnosis not present

## 2018-01-03 NOTE — Progress Notes (Signed)
Remote pacemaker transmission.   

## 2018-01-18 ENCOUNTER — Other Ambulatory Visit: Payer: Self-pay | Admitting: *Deleted

## 2018-01-18 DIAGNOSIS — I714 Abdominal aortic aneurysm, without rupture, unspecified: Secondary | ICD-10-CM

## 2018-01-31 ENCOUNTER — Ambulatory Visit (HOSPITAL_COMMUNITY): Admission: RE | Admit: 2018-01-31 | Payer: Medicare Other | Source: Ambulatory Visit

## 2018-01-31 ENCOUNTER — Ambulatory Visit (HOSPITAL_COMMUNITY)
Admission: RE | Admit: 2018-01-31 | Discharge: 2018-01-31 | Disposition: A | Discharge status: Home / Self Care | Payer: Medicare Other | Source: Ambulatory Visit | Attending: Cardiology | Admitting: Cardiology

## 2018-01-31 ENCOUNTER — Encounter (HOSPITAL_COMMUNITY): Payer: Self-pay

## 2018-01-31 DIAGNOSIS — I714 Abdominal aortic aneurysm, without rupture, unspecified: Secondary | ICD-10-CM

## 2018-02-12 LAB — CUP PACEART REMOTE DEVICE CHECK
Battery Remaining Longevity: 131 mo
Battery Remaining Percentage: 95.5 %
Battery Voltage: 2.99 V
Brady Statistic AP VP Percent: 1 %
Brady Statistic AP VS Percent: 1 %
Brady Statistic AS VP Percent: 99 %
Brady Statistic AS VS Percent: 1 %
Brady Statistic RA Percent Paced: 1 %
Brady Statistic RV Percent Paced: 99 %
Date Time Interrogation Session: 20191216083605
Implantable Lead Implant Date: 20160322
Implantable Lead Implant Date: 20160322
Implantable Lead Location: 753859
Implantable Lead Location: 753860
Implantable Lead Model: 1948
Implantable Pulse Generator Implant Date: 20160322
Lead Channel Impedance Value: 360 Ohm
Lead Channel Impedance Value: 750 Ohm
Lead Channel Pacing Threshold Amplitude: 0.5 V
Lead Channel Pacing Threshold Amplitude: 0.75 V
Lead Channel Pacing Threshold Pulse Width: 0.4 ms
Lead Channel Pacing Threshold Pulse Width: 0.4 ms
Lead Channel Sensing Intrinsic Amplitude: 12 mV
Lead Channel Sensing Intrinsic Amplitude: 4.6 mV
Lead Channel Setting Pacing Amplitude: 2 V
Lead Channel Setting Sensing Sensitivity: 2.5 mV
MDC IDC PG SERIAL: 7731685
MDC IDC SET LEADCHNL RV PACING AMPLITUDE: 0.75 V
MDC IDC SET LEADCHNL RV PACING PULSEWIDTH: 0.4 ms

## 2018-02-14 DIAGNOSIS — H43811 Vitreous degeneration, right eye: Secondary | ICD-10-CM | POA: Diagnosis not present

## 2018-02-14 DIAGNOSIS — H353211 Exudative age-related macular degeneration, right eye, with active choroidal neovascularization: Secondary | ICD-10-CM | POA: Diagnosis not present

## 2018-02-14 DIAGNOSIS — H353132 Nonexudative age-related macular degeneration, bilateral, intermediate dry stage: Secondary | ICD-10-CM | POA: Diagnosis not present

## 2018-02-14 DIAGNOSIS — Z961 Presence of intraocular lens: Secondary | ICD-10-CM | POA: Diagnosis not present

## 2018-02-25 ENCOUNTER — Other Ambulatory Visit: Payer: Self-pay | Admitting: Cardiology

## 2018-03-08 ENCOUNTER — Encounter: Payer: Self-pay | Admitting: Cardiology

## 2018-03-22 NOTE — Progress Notes (Signed)
HPI: FU CAD, HTN, HL, AAA. He presented to the hospital in 11/10 with an inferior STEMI. LHC 11/2008: Mid to distal LAD 70-75%, mid CFX occluded, ostial RCA 50%, mid RCA 50%, inferior AK, EF 50%. He underwent overlapping bare-metal stents to the CFX at that time. Follow up [facility] 01/2009 demonstrated mid LAD 50%, mid CFX stents patent. There was 50-70% stenosis at the proximal edge of the stent. IVUS of the CFX and LAD demonstrated no hemodynamically significant stenoses. Continued medical therapy was recommended. Nuclear study 8/14 showed inferior scar and no ischemia. Echocardiogram March 2016 showed normal LV function and mild mitral regurgitation. Patient had complete heart block at that time and had pacemaker inserted. Abdominal US January 2020 showed abdominal aorta measuring 2.3 cm.  Since last seen,he has some dyspnea on exertion unchanged.  No orthopnea, PND, pedal edema, palpitations, syncope or chest pain.  Current Outpatient Medications  Medication Sig Dispense Refill  . aspirin EC 81 MG EC tablet Take 1 tablet (81 mg total) by mouth daily. 30 tablet 0  . BREO ELLIPTA 200-25 MCG/INH AEPB INHALE 1 PUFF BY MOUTH ONCE DAILY 180 each 3  . lisinopril (PRINIVIL,ZESTRIL) 2.5 MG tablet TAKE 1 TABLET BY MOUTH ONCE DAILY 90 tablet 3  . Lutein 10 MG TABS Take 10 mg by mouth 2 (two) times daily.      . metoprolol tartrate (LOPRESSOR) 25 MG tablet TAKE 1 TABLET BY MOUTH TWICE DAILY 180 tablet 0  . nitroGLYCERIN (NITROSTAT) 0.4 MG SL tablet Place 1 tablet (0.4 mg total) under the tongue every 5 (five) minutes as needed for chest pain. 25 tablet 12  . rosuvastatin (CRESTOR) 40 MG tablet TAKE 1/2 (ONE-HALF) TABLET BY MOUTH ONCE DAILY 45 tablet 3  . VENTOLIN HFA 108 (90 Base) MCG/ACT inhaler INHALE 1 TO 2 PUFFS BY MOUTH EVERY 6 HOURS AS NEEDED FOR WHEEZING AND FOR SHORTNESS OF BREATH 18 g 3   No current facility-administered medications for this visit.      Past Medical History:  Diagnosis Date    . AAA (abdominal aortic aneurysm) ([place])    a. Abdominal US 01/2011:  2.4 x 2.4 cm. Follow up recommended in 2 years.;  b. Abdominal US (01/2013):  Infrarenal AAA 2.4 x 2.4 cm  . Allergic rhinitis due to pollen   . Arthritis   . CAD (coronary artery disease) 11/2008   a. Inf STEMI 11/2008 => Mid to dist LAD 70-75%, mid CFX occluded, oRCA 50%, mRCA 50%, inf AK, EF 50% => PCI: overlapping BMSs to CFX;  b.  F/u LHC 01/2009:  mid LAD 50%, mid CFX stents patent. There was 50-70% stenosis at the prox edge of the stent; IVUS of the CFX and LAD with no hemodynamically significant stenoses=>Med Rx.;  c.  ETT-Myoview 8/14:  Inf and IL scar, no ischemia, low risk  . Cancer of lung, upper lobe, Right, in remission 2000  . Cataract   . Complete heart block (HCC)    a. a/w syncope b. s/p STJ dual chamber pacemaker 03/2014  . COPD with emphysema ([place])   . Hyperlipidemia   . Macular degeneration (senile) of retina 10/18/2017  . Prostate cancer ([place]) 2000  . Raynauds disease   . Tinnitus of both ears     Past Surgical History:  Procedure Laterality Date  . ANGIOPLASTY / STENTING FEMORAL    . CATARACT EXTRACTION, BILATERAL    . COLONOSCOPY  2007  . LUNG LOBECTOMY Right 2000   Right  upper lobectomy  . PERMANENT PACEMAKER INSERTION N/A 04/09/2014   Procedure: Daneil Dan;  Surgeon: Peter M Martinique, MD;  Location: [facility] CATH LAB;  Service: Cardiovascular;  Laterality: N/A;  . PERMANENT PACEMAKER INSERTION N/A 04/10/2014   STJ dual chamber pacemaker implanted by Dr Rayann Heman for CHB  . POLYPECTOMY    . 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 Marital status: Married    Spouse name: Not on file  . Number of children: Not on file  . Years of education: Not on file  . Highest education level: Not on file  Occupational History  . Not on file  Social Needs  . Financial resource strain: Not on file  . Food  insecurity:    Worry: Not on file    Inability: Not on file  . Transportation needs:    Medical: Not on file    Non-medical: Not on file  Tobacco Use  . Smoking status: Former Smoker    Years: 50.00    Types: Cigarettes    Last attempt to quit: 01/20/2007    Years since quitting: 11.1  . Smokeless tobacco: Never Used  Substance and Sexual Activity  . Alcohol use: Yes    Alcohol/week: 10.0 standard drinks    Types: 10 Cans of beer per week    Comment: moderate  . Drug use: No  . Sexual activity: Never  Lifestyle  . Physical activity:    Days per week: Not on file    Minutes per session: Not on file  . Stress: Not on file  Relationships  . Social connections:    Talks on phone: Not on file    Gets together: Not on file    Attends religious service: Not on file    Active member of club or organization: Not on file    Attends meetings of clubs or organizations: Not on file    Relationship status: Not on file  . Intimate partner violence:    Fear of current or ex partner: Not on file    Emotionally abused: Not on file    Physically abused: Not on file    Forced sexual activity: Not on file  Other Topics Concern  . Not on file  Social History Narrative  . Not on file    Family History  Problem Relation Age of Onset  . Colon cancer Sister 74       rectal cancer  . Lung cancer Father 21       smoker and coal mining  . Congestive Heart Failure Mother   . CAD Brother   . Tuberculosis Brother   . Kidney failure Brother        ESRD on dialysis  . Coronary artery disease Brother        s/p CABG    ROS: no fevers or chills, productive cough, hemoptysis, dysphasia, odynophagia, melena, hematochezia, dysuria, hematuria, rash, seizure activity, orthopnea, PND, pedal edema, claudication. Remaining systems are negative.  Physical Exam: Well-developed well-nourished in no acute distress.  Skin is warm and dry.  HEENT is normal.  Neck is supple.  Chest with mildly  diminished breath sounds and mild expiratory wheeze. Cardiovascular exam is regular rate and rhythm.  Abdominal exam nontender or distended. No masses palpated. Extremities show no edema. neuro grossly intact  A/P  1 coronary artery disease-patient doing well with no chest pain.  Continue  medical therapy with aspirin and statin.  2 abdominal aortic aneurysm-2.3 cm on most recent ultrasound.  Will repeat study in 2 years.  3 hypertension-blood pressure is controlled.  Continue present medications and follow.  4 hyperlipidemia-continue statin.  Lipids and liver monitored by primary care.  5 prior pacemaker-followed by electrophysiology.  Kirk Ruths, MD

## 2018-03-25 ENCOUNTER — Ambulatory Visit (INDEPENDENT_AMBULATORY_CARE_PROVIDER_SITE_OTHER): Payer: Medicare Other | Admitting: Cardiology

## 2018-03-25 ENCOUNTER — Encounter: Payer: Self-pay | Admitting: Cardiology

## 2018-03-25 VITALS — BP 130/64 | HR 75 | Ht 71.0 in | Wt 140.0 lb

## 2018-03-25 DIAGNOSIS — I2510 Atherosclerotic heart disease of native coronary artery without angina pectoris: Secondary | ICD-10-CM

## 2018-03-25 DIAGNOSIS — I714 Abdominal aortic aneurysm, without rupture, unspecified: Secondary | ICD-10-CM

## 2018-03-25 DIAGNOSIS — I10 Essential (primary) hypertension: Secondary | ICD-10-CM | POA: Diagnosis not present

## 2018-03-25 DIAGNOSIS — E7800 Pure hypercholesterolemia, unspecified: Secondary | ICD-10-CM | POA: Diagnosis not present

## 2018-03-25 NOTE — Patient Instructions (Signed)

## 2018-04-04 ENCOUNTER — Other Ambulatory Visit: Payer: Self-pay

## 2018-04-04 ENCOUNTER — Ambulatory Visit (INDEPENDENT_AMBULATORY_CARE_PROVIDER_SITE_OTHER): Payer: Medicare Other | Admitting: *Deleted

## 2018-04-04 DIAGNOSIS — I442 Atrioventricular block, complete: Secondary | ICD-10-CM

## 2018-04-07 ENCOUNTER — Other Ambulatory Visit: Payer: Self-pay | Admitting: Cardiology

## 2018-04-07 LAB — CUP PACEART REMOTE DEVICE CHECK
Battery Remaining Longevity: 132 mo
Battery Remaining Percentage: 95.5 %
Brady Statistic AP VP Percent: 1 %
Brady Statistic AP VS Percent: 1 %
Brady Statistic AS VP Percent: 99 %
Brady Statistic AS VS Percent: 1 %
Brady Statistic RA Percent Paced: 1 %
Brady Statistic RV Percent Paced: 99 %
Date Time Interrogation Session: 20200316072122
Implantable Lead Implant Date: 20160322
Implantable Lead Implant Date: 20160322
Implantable Lead Location: 753859
Implantable Lead Model: 1948
Implantable Pulse Generator Implant Date: 20160322
Lead Channel Impedance Value: 430 Ohm
Lead Channel Impedance Value: 760 Ohm
Lead Channel Pacing Threshold Amplitude: 0.5 V
Lead Channel Pacing Threshold Pulse Width: 0.4 ms
Lead Channel Pacing Threshold Pulse Width: 0.4 ms
Lead Channel Sensing Intrinsic Amplitude: 12 mV
Lead Channel Sensing Intrinsic Amplitude: 5 mV
Lead Channel Setting Pacing Amplitude: 0.75 V
Lead Channel Setting Pacing Amplitude: 2 V
Lead Channel Setting Pacing Pulse Width: 0.4 ms
Lead Channel Setting Sensing Sensitivity: 2.5 mV
MDC IDC LEAD LOCATION: 753860
MDC IDC MSMT BATTERY VOLTAGE: 2.99 V
MDC IDC MSMT LEADCHNL RA PACING THRESHOLD AMPLITUDE: 0.75 V
Pulse Gen Serial Number: 7731685

## 2018-04-11 DIAGNOSIS — H353132 Nonexudative age-related macular degeneration, bilateral, intermediate dry stage: Secondary | ICD-10-CM | POA: Diagnosis not present

## 2018-04-11 DIAGNOSIS — H353211 Exudative age-related macular degeneration, right eye, with active choroidal neovascularization: Secondary | ICD-10-CM | POA: Diagnosis not present

## 2018-04-11 DIAGNOSIS — H43811 Vitreous degeneration, right eye: Secondary | ICD-10-CM | POA: Diagnosis not present

## 2018-04-11 NOTE — Progress Notes (Signed)
Remote pacemaker transmission.   

## 2018-06-03 DIAGNOSIS — H524 Presbyopia: Secondary | ICD-10-CM | POA: Diagnosis not present

## 2018-06-03 DIAGNOSIS — H353131 Nonexudative age-related macular degeneration, bilateral, early dry stage: Secondary | ICD-10-CM | POA: Diagnosis not present

## 2018-06-03 DIAGNOSIS — Z961 Presence of intraocular lens: Secondary | ICD-10-CM | POA: Diagnosis not present

## 2018-06-03 DIAGNOSIS — H52203 Unspecified astigmatism, bilateral: Secondary | ICD-10-CM | POA: Diagnosis not present

## 2018-06-14 ENCOUNTER — Other Ambulatory Visit: Payer: Self-pay

## 2018-06-14 DIAGNOSIS — H353211 Exudative age-related macular degeneration, right eye, with active choroidal neovascularization: Secondary | ICD-10-CM | POA: Diagnosis not present

## 2018-06-14 DIAGNOSIS — Z961 Presence of intraocular lens: Secondary | ICD-10-CM | POA: Diagnosis not present

## 2018-06-14 DIAGNOSIS — H353132 Nonexudative age-related macular degeneration, bilateral, intermediate dry stage: Secondary | ICD-10-CM | POA: Diagnosis not present

## 2018-06-14 DIAGNOSIS — H43811 Vitreous degeneration, right eye: Secondary | ICD-10-CM | POA: Diagnosis not present

## 2018-06-14 MED ORDER — METOPROLOL TARTRATE 25 MG PO TABS: 25.0000 mg | ORAL_TABLET | Freq: Two times a day (BID) | ORAL | 0 refills | Status: DC

## 2018-06-20 ENCOUNTER — Telehealth: Payer: Self-pay | Admitting: Cardiology

## 2018-06-20 NOTE — Telephone Encounter (Signed)
New Message      Pt is calling and says he wants to update his pharmacy information  [facility] on [address]

## 2018-06-20 NOTE — Telephone Encounter (Signed)
[place]

## 2018-07-04 ENCOUNTER — Other Ambulatory Visit: Payer: Self-pay | Admitting: Cardiology

## 2018-07-04 ENCOUNTER — Ambulatory Visit (INDEPENDENT_AMBULATORY_CARE_PROVIDER_SITE_OTHER): Payer: Medicare Other | Admitting: *Deleted

## 2018-07-04 DIAGNOSIS — I442 Atrioventricular block, complete: Secondary | ICD-10-CM

## 2018-07-04 LAB — CUP PACEART REMOTE DEVICE CHECK
Battery Remaining Longevity: 133 mo
Battery Remaining Percentage: 95.5 %
Battery Voltage: 2.99 V
Brady Statistic AP VP Percent: 1 %
Brady Statistic AP VS Percent: 1 %
Brady Statistic AS VP Percent: 99 %
Brady Statistic AS VS Percent: 1 %
Brady Statistic RA Percent Paced: 1 %
Brady Statistic RV Percent Paced: 99 %
Date Time Interrogation Session: 20200615060015
Implantable Lead Implant Date: 20160322
Implantable Lead Implant Date: 20160322
Implantable Lead Location: 753859
Implantable Lead Location: 753860
Implantable Lead Model: 1948
Implantable Pulse Generator Implant Date: 20160322
Lead Channel Impedance Value: 440 Ohm
Lead Channel Impedance Value: 810 Ohm
Lead Channel Pacing Threshold Amplitude: 0.625 V
Lead Channel Pacing Threshold Amplitude: 0.75 V
Lead Channel Pacing Threshold Pulse Width: 0.4 ms
Lead Channel Pacing Threshold Pulse Width: 0.4 ms
Lead Channel Sensing Intrinsic Amplitude: 3.7 mV
Lead Channel Sensing Intrinsic Amplitude: 4.7 mV
Lead Channel Setting Pacing Amplitude: 0.875
Lead Channel Setting Pacing Amplitude: 2 V
Lead Channel Setting Pacing Pulse Width: 0.4 ms
Lead Channel Setting Sensing Sensitivity: 2.5 mV
Pulse Gen Model: 2240
Pulse Gen Serial Number: 7731685

## 2018-07-04 MED ORDER — LISINOPRIL 2.5 MG PO TABS: 2.5000 mg | ORAL_TABLET | Freq: Every day | ORAL | 3 refills | Status: DC

## 2018-07-04 NOTE — Telephone Encounter (Signed)
°*  STAT* If patient is at the pharmacy, call can be transferred to refill team.   1. Which medications need to be refilled? (please list name of each medication and dose if known) Lisinopril 2.5 mg  2. Which pharmacy/location (including street and city if local pharmacy) is medication to be sent to? [place]  3. Do they need a 30 day or 90 day supply? 90 he thinks

## 2018-07-10 ENCOUNTER — Encounter: Payer: Self-pay | Admitting: Cardiology

## 2018-07-10 NOTE — Progress Notes (Signed)
Remote pacemaker transmission.   

## 2018-08-28 DIAGNOSIS — Z23 Encounter for immunization: Secondary | ICD-10-CM | POA: Diagnosis not present

## 2018-09-05 ENCOUNTER — Other Ambulatory Visit: Payer: Self-pay | Admitting: Cardiology

## 2018-09-13 DIAGNOSIS — H353211 Exudative age-related macular degeneration, right eye, with active choroidal neovascularization: Secondary | ICD-10-CM | POA: Diagnosis not present

## 2018-09-13 DIAGNOSIS — H35361 Drusen (degenerative) of macula, right eye: Secondary | ICD-10-CM | POA: Diagnosis not present

## 2018-09-13 DIAGNOSIS — H43811 Vitreous degeneration, right eye: Secondary | ICD-10-CM | POA: Diagnosis not present

## 2018-09-13 DIAGNOSIS — H353132 Nonexudative age-related macular degeneration, bilateral, intermediate dry stage: Secondary | ICD-10-CM | POA: Diagnosis not present

## 2018-10-03 ENCOUNTER — Ambulatory Visit (INDEPENDENT_AMBULATORY_CARE_PROVIDER_SITE_OTHER): Payer: Medicare Other | Admitting: *Deleted

## 2018-10-03 DIAGNOSIS — I4510 Unspecified right bundle-branch block: Secondary | ICD-10-CM

## 2018-10-03 DIAGNOSIS — I442 Atrioventricular block, complete: Secondary | ICD-10-CM | POA: Diagnosis not present

## 2018-10-03 LAB — CUP PACEART REMOTE DEVICE CHECK
Battery Remaining Longevity: 131 mo
Battery Remaining Percentage: 95.5 %
Battery Voltage: 2.98 V
Brady Statistic AP VP Percent: 1 %
Brady Statistic AP VS Percent: 1 %
Brady Statistic AS VP Percent: 99 %
Brady Statistic AS VS Percent: 1 %
Brady Statistic RA Percent Paced: 1 %
Brady Statistic RV Percent Paced: 99 %
Date Time Interrogation Session: 20200914060015
Implantable Lead Implant Date: 20160322
Implantable Lead Implant Date: 20160322
Implantable Lead Location: 753859
Implantable Lead Location: 753860
Implantable Lead Model: 1948
Implantable Pulse Generator Implant Date: 20160322
Lead Channel Impedance Value: 400 Ohm
Lead Channel Impedance Value: 740 Ohm
Lead Channel Pacing Threshold Amplitude: 0.625 V
Lead Channel Pacing Threshold Amplitude: 0.75 V
Lead Channel Pacing Threshold Pulse Width: 0.4 ms
Lead Channel Pacing Threshold Pulse Width: 0.4 ms
Lead Channel Sensing Intrinsic Amplitude: 4.9 mV
Lead Channel Sensing Intrinsic Amplitude: 7.4 mV
Lead Channel Setting Pacing Amplitude: 0.875
Lead Channel Setting Pacing Amplitude: 2 V
Lead Channel Setting Pacing Pulse Width: 0.4 ms
Lead Channel Setting Sensing Sensitivity: 2.5 mV
Pulse Gen Model: 2240
Pulse Gen Serial Number: 7731685

## 2018-10-11 ENCOUNTER — Other Ambulatory Visit: Payer: Self-pay | Admitting: Family Medicine

## 2018-10-11 DIAGNOSIS — E785 Hyperlipidemia, unspecified: Secondary | ICD-10-CM

## 2018-10-11 DIAGNOSIS — Z79899 Other long term (current) drug therapy: Secondary | ICD-10-CM

## 2018-10-11 DIAGNOSIS — Z125 Encounter for screening for malignant neoplasm of prostate: Secondary | ICD-10-CM

## 2018-10-14 ENCOUNTER — Ambulatory Visit (INDEPENDENT_AMBULATORY_CARE_PROVIDER_SITE_OTHER): Payer: Medicare Other

## 2018-10-14 ENCOUNTER — Other Ambulatory Visit: Payer: Self-pay

## 2018-10-14 ENCOUNTER — Other Ambulatory Visit (INDEPENDENT_AMBULATORY_CARE_PROVIDER_SITE_OTHER): Payer: Medicare Other

## 2018-10-14 ENCOUNTER — Telehealth: Payer: Self-pay

## 2018-10-14 ENCOUNTER — Ambulatory Visit: Payer: Medicare Other

## 2018-10-14 DIAGNOSIS — Z125 Encounter for screening for malignant neoplasm of prostate: Secondary | ICD-10-CM | POA: Diagnosis not present

## 2018-10-14 DIAGNOSIS — Z0000 Encounter for general adult medical examination without abnormal findings: Secondary | ICD-10-CM

## 2018-10-14 DIAGNOSIS — Z79899 Other long term (current) drug therapy: Secondary | ICD-10-CM

## 2018-10-14 DIAGNOSIS — E785 Hyperlipidemia, unspecified: Secondary | ICD-10-CM

## 2018-10-14 LAB — CBC WITH DIFFERENTIAL/PLATELET
Basophils Absolute: 0.1 10*3/uL (ref 0.0–0.1)
Basophils Relative: 1 % (ref 0.0–3.0)
Eosinophils Absolute: 0.1 10*3/uL (ref 0.0–0.7)
Eosinophils Relative: 1.4 % (ref 0.0–5.0)
HCT: 40.2 % (ref 39.0–52.0)
Hemoglobin: 13.8 g/dL (ref 13.0–17.0)
Lymphocytes Relative: 12.8 % (ref 12.0–46.0)
Lymphs Abs: 1.2 10*3/uL (ref 0.7–4.0)
MCHC: 34.3 g/dL (ref 30.0–36.0)
MCV: 103.1 fl — ABNORMAL HIGH (ref 78.0–100.0)
Monocytes Absolute: 0.9 10*3/uL (ref 0.1–1.0)
Monocytes Relative: 10 % (ref 3.0–12.0)
Neutro Abs: 6.9 10*3/uL (ref 1.4–7.7)
Neutrophils Relative %: 74.8 % (ref 43.0–77.0)
Platelets: 246 10*3/uL (ref 150.0–400.0)
RBC: 3.9 Mil/uL — ABNORMAL LOW (ref 4.22–5.81)
RDW: 13.9 % (ref 11.5–15.5)
WBC: 9.3 10*3/uL (ref 4.0–10.5)

## 2018-10-14 LAB — BASIC METABOLIC PANEL
BUN: 13 mg/dL (ref 6–23)
CO2: 27 mEq/L (ref 19–32)
Calcium: 9.7 mg/dL (ref 8.4–10.5)
Chloride: 93 mEq/L — ABNORMAL LOW (ref 96–112)
Creatinine, Ser: 0.68 mg/dL (ref 0.40–1.50)
GFR: 111.7 mL/min (ref >60.00)
Glucose, Bld: 103 mg/dL — ABNORMAL HIGH (ref 70–99)
Potassium: 5.2 mEq/L — ABNORMAL HIGH (ref 3.5–5.1)
Sodium: 127 mEq/L — ABNORMAL LOW (ref 135–145)

## 2018-10-14 LAB — LIPID PANEL
Cholesterol: 98 mg/dL (ref 0–200)
HDL: 52.6 mg/dL (ref >39.00)
LDL Cholesterol: 32 mg/dL (ref 0–99)
NonHDL: 45.48
Total CHOL/HDL Ratio: 2
Triglycerides: 65 mg/dL (ref 0.0–149.0)
VLDL: 13 mg/dL (ref 0.0–40.0)

## 2018-10-14 LAB — HEPATIC FUNCTION PANEL
ALT: 13 U/L (ref 0–53)
AST: 19 U/L (ref 0–37)
Albumin: 4.1 g/dL (ref 3.5–5.2)
Alkaline Phosphatase: 82 U/L (ref 39–117)
Bilirubin, Direct: 0.1 mg/dL (ref 0.0–0.3)
Total Bilirubin: 0.6 mg/dL (ref 0.2–1.2)
Total Protein: 7.4 g/dL (ref 6.0–8.3)

## 2018-10-14 NOTE — Progress Notes (Signed)
Subjective:   Patrick Sampson is a 84 y.o. male who presents for Medicare Annual/Subsequent preventive examination.  Review of Systems:    This visit is being conducted through telemedicine via telephone at the nurse health advisor's home address due to the COVID-19 pandemic. This patient has given me verbal consent via doximity to conduct this visit, patient states they are participating from their home address. Some vital signs may be absent or patient reported.    Patient identification: identified by name, DOB, and current address  Cardiac Risk Factors include: advanced age (>64men, >66 women);male gender     Objective:    Vitals: There were no vitals taken for this visit.  There is no height or weight on file to calculate BMI.  Advanced Directives 10/14/2018 10/12/2017 10/08/2016 04/09/2014 04/09/2014  Does Patient Have a Medical Advance Directive? Yes Yes Yes Yes No  Type of Paramedic of [place];Living will [place];Living will [place];Living will [place];Living will -  Does patient want to make changes to medical advance directive? - - - No - Patient declined -  Copy of [place] in Chart? Yes - validated most recent copy scanned in chart (See row information) No - copy requested No - copy requested No - copy requested -    Tobacco Social History   Tobacco Use  Smoking Status Former Smoker  . Years: 50.00  . Types: Cigarettes  . Quit date: 01/20/2007  . Years since quitting: 11.7  Smokeless Tobacco Never Used     Counseling given: Not Answered   Clinical Intake:  Pre-visit preparation completed: Yes  Pain : No/denies pain     Nutritional Risks: None Diabetes: No  How often do you need to have someone help you when you read instructions, pamphlets, or other written materials from your doctor or pharmacy?: 1 - Never What is the last grade level you completed in  school?: Graduate degree Chemistry and Math  Interpreter Needed?: No  Information entered by :: CJohnson, LPN  Past Medical History:  Diagnosis Date  . AAA (abdominal aortic aneurysm) ([place])    a. Abdominal US 01/2011:  2.4 x 2.4 cm. Follow up recommended in 2 years.;  b. Abdominal US (01/2013):  Infrarenal AAA 2.4 x 2.4 cm  . Allergic rhinitis due to pollen   . Arthritis   . CAD (coronary artery disease) 11/2008   a. Inf STEMI 11/2008 => Mid to dist LAD 70-75%, mid CFX occluded, oRCA 50%, mRCA 50%, inf AK, EF 50% => PCI: overlapping BMSs to CFX;  b.  F/u LHC 01/2009:  mid LAD 50%, mid CFX stents patent. There was 50-70% stenosis at the prox edge of the stent; IVUS of the CFX and LAD with no hemodynamically significant stenoses=>Med Rx.;  c.  ETT-Myoview 8/14:  Inf and IL scar, no ischemia, low risk  . Cancer of lung, upper lobe, Right, in remission 2000  . Cataract   . Complete heart block (HCC)    a. a/w syncope b. s/p STJ dual chamber pacemaker 03/2014  . COPD with emphysema ([place])   . Hyperlipidemia   . Macular degeneration (senile) of retina 10/18/2017  . Prostate cancer ([place]) 2000  . Raynauds disease   . Tinnitus of both ears    Past Surgical History:  Procedure Laterality Date  . ANGIOPLASTY / STENTING FEMORAL    . CATARACT EXTRACTION, BILATERAL    . COLONOSCOPY  2007  .  LUNG LOBECTOMY Right 2000   Right upper lobectomy  . PERMANENT PACEMAKER INSERTION N/A 04/09/2014   Procedure: Daneil Dan;  Surgeon: Peter M Martinique, MD;  Location: [facility] CATH LAB;  Service: Cardiovascular;  Laterality: N/A;  . PERMANENT PACEMAKER INSERTION N/A 04/10/2014   STJ dual chamber pacemaker implanted by Dr Rayann Heman for CHB  . POLYPECTOMY    . TRANSPERINEAL IMPLANT OF RADIATION SEEDS W/ ULTRASOUND  2000   for cancer,rad.with seed plantation   Family History  Problem Relation Age of Onset  . Colon cancer Sister 51       rectal cancer  . Lung cancer Father 32       smoker and coal mining  . Congestive  Heart Failure Mother   . CAD Brother   . Tuberculosis Brother   . Kidney failure Brother        ESRD on dialysis  . Coronary artery disease Brother        s/p CABG   Social History   Socioeconomic History  . Marital status: Married    Spouse name: Not on file  . Number of children: Not on file  . Years of education: Not on file  . Highest education level: Not on file  Occupational History  . Not on file  Social Needs  . Financial resource strain: Not hard at all  . Food insecurity    Worry: Never true    Inability: Never true  . Transportation needs    Medical: No    Non-medical: No  Tobacco Use  . Smoking status: Former Smoker    Years: 50.00    Types: Cigarettes    Quit date: 01/20/2007    Years since quitting: 11.7  . Smokeless tobacco: Never Used  Substance and Sexual Activity  . Alcohol use: Yes    Alcohol/week: 10.0 standard drinks    Types: 10 Cans of beer per week    Comment: moderate  . Drug use: No  . Sexual activity: Never  Lifestyle  . Physical activity    Days per week: 7 days    Minutes per session: 60 min  . Stress: Not at all  Relationships  . Social Herbalist on phone: Not on file    Gets together: Not on file    Attends religious service: Not on file    Active member of club or organization: Not on file    Attends meetings of clubs or organizations: Not on file    Relationship status: Not on file  Other Topics Concern  . Not on file  Social History Narrative  . Not on file    Outpatient Encounter Medications as of 10/14/2018  Medication Sig  . aspirin EC 81 MG EC tablet Take 1 tablet (81 mg total) by mouth daily.  Marland Kitchen BREO ELLIPTA 200-25 MCG/INH AEPB INHALE 1 PUFF BY MOUTH ONCE DAILY  . lisinopril (ZESTRIL) 2.5 MG tablet Take 1 tablet (2.5 mg total) by mouth daily.  . Lutein 10 MG TABS Take 10 mg by mouth 2 (two) times daily.    . metoprolol tartrate (LOPRESSOR) 25 MG tablet Take 1 tablet by mouth twice daily  . nitroGLYCERIN  (NITROSTAT) 0.4 MG SL tablet Place 1 tablet (0.4 mg total) under the tongue every 5 (five) minutes as needed for chest pain.  . rosuvastatin (CRESTOR) 40 MG tablet TAKE 1/2 (ONE-HALF) TABLET BY MOUTH ONCE DAILY  . VENTOLIN HFA 108 (90 Base) MCG/ACT inhaler INHALE 1 TO 2 PUFFS BY  MOUTH EVERY 6 HOURS AS NEEDED FOR WHEEZING AND FOR SHORTNESS OF BREATH   No facility-administered encounter medications on file as of 10/14/2018.     Activities of Daily Living In your present state of health, do you have any difficulty performing the following activities: 10/14/2018  Hearing? Y  Comment ringing in ears  Vision? Y  Comment macular degeneration  Difficulty concentrating or making decisions? N  Walking or climbing stairs? N  Dressing or bathing? N  Doing errands, shopping? N  Preparing Food and eating ? N  Using the Toilet? N  In the past six months, have you accidently leaked urine? N  Do you have problems with loss of bowel control? N  Managing your Medications? N  Managing your Finances? N  Housekeeping or managing your Housekeeping? N  Some recent data might be hidden    Patient Care Team: Owens Loffler, MD as PCP - General Patsey Berthold, NP as Nurse Practitioner (Cardiology)   Assessment:   This is a routine wellness examination for Leron.  Exercise Activities and Dietary recommendations Current Exercise Habits: Home exercise routine, Type of exercise: Other - see comments(stationary bike), Time (Minutes): 60, Frequency (Times/Week): 7, Weekly Exercise (Minutes/Week): 420, Intensity: Moderate, Exercise limited by: None identified  Goals    . Increase physical activity     Starting 10/12/2017, I will continue to exercise for at least 60 minutes daily.     . Patient Stated     10/14/2018, Patient wants to maintain and take medications as prescribed.        Fall Risk Fall Risk  10/14/2018 10/12/2017 10/08/2016 07/31/2015 07/30/2014  Falls in the past year? 0 No No No Yes  Number  falls in past yr: 0 - - - 1  Injury with Fall? - - - - Yes  Risk for fall due to : Medication side effect - - - -  Follow up Falls evaluation completed;Falls prevention discussed - - - -   Is the patient's home free of loose throw rugs in walkways, pet beds, electrical cords, etc?   yes      Grab bars in the bathroom? yes      Handrails on the stairs?   yes      Adequate lighting?   yes  Timed Get Up and Go Performed: n/a  Depression Screen PHQ 2/9 Scores 10/14/2018 10/12/2017 10/08/2016 07/31/2015  PHQ - 2 Score 0 0 0 0  PHQ- 9 Score 0 0 0 -    Cognitive Function MMSE - Mini Mental State Exam 10/14/2018 10/12/2017 10/08/2016  Orientation to time 5 5 5   Orientation to Place 5 5 5   Registration 3 3 3   Attention/ Calculation 5 0 0  Recall 3 3 3   Language- name 2 objects - 0 0  Language- repeat 1 1 1   Language- follow 3 step command - 3 3  Language- read & follow direction - 0 0  Write a sentence - 0 0  Copy design - 0 0  Total score - 20 20  Mini Cog  Mini-Cog screen was completed. Maximum score is 22. A value of 0 denotes this part of the MMSE was not completed or the patient failed this part of the Mini-Cog screening.      Immunization History  Administered Date(s) Administered  . Influenza Split 10/14/2012  . Influenza Whole 01/19/2005  . Influenza, High Dose Seasonal PF 09/26/2014, 10/03/2015, 10/08/2016, 10/06/2017, 08/28/2018, 08/28/2018  . Influenza-Unspecified 10/11/2013  . Pneumococcal Conjugate-13 03/01/2013  .  Pneumococcal Polysaccharide-23 01/19/2002, 07/20/2008  . Td 01/19/1997, 07/11/2007  . Zoster 10/20/2006  . Zoster Recombinat (Shingrix) 04/05/2017, 07/18/2017    Qualifies for Shingles Vaccine? Series completed  Screening Tests Health Maintenance  Topic Date Due  . TETANUS/TDAP  01/19/2019 (Originally 07/10/2017)  . INFLUENZA VACCINE  Completed  . PNA vac Low Risk Adult  Completed   Cancer Screenings: Lung: Low Dose CT Chest recommended if Age 61-80  years, 30 pack-year currently smoking OR have quit w/in 15years. Patient does not qualify. Colorectal: no longer required  Additional Screenings:  Hepatitis C Screening: n/a      Plan:   Patient wants to maintain and take medications as prescribed.  I have personally reviewed and noted the following in the patient's chart:   . Medical and social history . Use of alcohol, tobacco or illicit drugs  . Current medications and supplements . Functional ability and status . Nutritional status . Physical activity . Advanced directives . List of other physicians . Hospitalizations, surgeries, and ER visits in previous 12 months . Vitals . Screenings to include cognitive, depression, and falls . Referrals and appointments  In addition, I have reviewed and discussed with patient certain preventive protocols, quality metrics, and best practice recommendations. A written personalized care plan for preventive services as well as general preventive health recommendations were provided to patient.     Andrez Grime, LPN  [id number]

## 2018-10-14 NOTE — Patient Instructions (Signed)
Patrick Sampson , Thank you for taking time to come for your Medicare Wellness Visit. I appreciate your ongoing commitment to your health goals. Please review the following plan we discussed and let me know if I can assist you in the future.   Screening recommendations/referrals: Colonoscopy: no longer required Recommended yearly ophthalmology/optometry visit for glaucoma screening and checkup Recommended yearly dental visit for hygiene and checkup  Vaccinations: Influenza vaccine: up to date, completed 08/28/2018 Pneumococcal vaccine: series completed Tdap vaccine: declined Shingles vaccine: series completed    Advanced directives: copy in chart  Conditions/risks identified: n/a  Next appointment: 10/19/2018 @ 2:20 pm  Preventive Care 74 Years and Older, Male Preventive care refers to lifestyle choices and visits with your health care provider that can promote health and wellness. What does preventive care include?  A yearly physical exam. This is also called an annual well check.  Dental exams once or twice a year.  Routine eye exams. Ask your health care provider how often you should have your eyes checked.  Personal lifestyle choices, including:  Daily care of your teeth and gums.  Regular physical activity.  Eating a healthy diet.  Avoiding tobacco and drug use.  Limiting alcohol use.  Practicing safe sex.  Taking low doses of aspirin every day.  Taking vitamin and mineral supplements as recommended by your health care provider. What happens during an annual well check? The services and screenings done by your health care provider during your annual well check will depend on your age, overall health, lifestyle risk factors, and family history of disease. Counseling  Your health care provider may ask you questions about your:  Alcohol use.  Tobacco use.  Drug use.  Emotional well-being.  Home and relationship well-being.  Sexual activity.  Eating habits.   History of falls.  Memory and ability to understand (cognition).  Work and work Statistician. Screening  You may have the following tests or measurements:  Height, weight, and BMI.  Blood pressure.  Lipid and cholesterol levels. These may be checked every 5 years, or more frequently if you are over 32 years old.  Skin check.  Lung cancer screening. You may have this screening every year starting at age 38 if you have a 30-pack-year history of smoking and currently smoke or have quit within the past 15 years.  Fecal occult blood test (FOBT) of the stool. You may have this test every year starting at age 73.  Flexible sigmoidoscopy or colonoscopy. You may have a sigmoidoscopy every 5 years or a colonoscopy every 10 years starting at age 42.  Prostate cancer screening. Recommendations will vary depending on your family history and other risks.  Hepatitis C blood test.  Hepatitis B blood test.  Sexually transmitted disease (STD) testing.  Diabetes screening. This is done by checking your blood sugar (glucose) after you have not eaten for a while (fasting). You may have this done every 1-3 years.  Abdominal aortic aneurysm (AAA) screening. You may need this if you are a current or former smoker.  Osteoporosis. You may be screened starting at age 63 if you are at high risk. Talk with your health care provider about your test results, treatment options, and if necessary, the need for more tests. Vaccines  Your health care provider may recommend certain vaccines, such as:  Influenza vaccine. This is recommended every year.  Tetanus, diphtheria, and acellular pertussis (Tdap, Td) vaccine. You may need a Td booster every 10 years.  Zoster vaccine. You may  need this after age 27.  Pneumococcal 13-valent conjugate (PCV13) vaccine. One dose is recommended after age 106.  Pneumococcal polysaccharide (PPSV23) vaccine. One dose is recommended after age 73. Talk to your health care  provider about which screenings and vaccines you need and how often you need them. This information is not intended to replace advice given to you by your health care provider. Make sure you discuss any questions you have with your health care provider. Document Released: 02/01/2015 Document Revised: 09/25/2015 Document Reviewed: 11/06/2014 Elsevier Interactive Patient Education  2017 [place] Prevention in the Home Falls can cause injuries. They can happen to people of all ages. There are many things you can do to make your home safe and to help prevent falls. What can I do on the outside of my home?  Regularly fix the edges of walkways and driveways and fix any cracks.  Remove anything that might make you trip as you walk through a door, such as a raised step or threshold.  Trim any bushes or trees on the path to your home.  Use bright outdoor lighting.  Clear any walking paths of anything that might make someone trip, such as rocks or tools.  Regularly check to see if handrails are loose or broken. Make sure that both sides of any steps have handrails.  Any raised decks and porches should have guardrails on the edges.  Have any leaves, snow, or ice cleared regularly.  Use sand or salt on walking paths during winter.  Clean up any spills in your garage right away. This includes oil or grease spills. What can I do in the bathroom?  Use night lights.  Install grab bars by the toilet and in the tub and shower. Do not use towel bars as grab bars.  Use non-skid mats or decals in the tub or shower.  If you need to sit down in the shower, use a plastic, non-slip stool.  Keep the floor dry. Clean up any water that spills on the floor as soon as it happens.  Remove soap buildup in the tub or shower regularly.  Attach bath mats securely with double-sided non-slip rug tape.  Do not have throw rugs and other things on the floor that can make you trip. What can I do in  the bedroom?  Use night lights.  Make sure that you have a light by your bed that is easy to reach.  Do not use any sheets or blankets that are too big for your bed. They should not hang down onto the floor.  Have a firm chair that has side arms. You can use this for support while you get dressed.  Do not have throw rugs and other things on the floor that can make you trip. What can I do in the kitchen?  Clean up any spills right away.  Avoid walking on wet floors.  Keep items that you use a lot in easy-to-reach places.  If you need to reach something above you, use a strong step stool that has a grab bar.  Keep electrical cords out of the way.  Do not use floor polish or wax that makes floors slippery. If you must use wax, use non-skid floor wax.  Do not have throw rugs and other things on the floor that can make you trip. What can I do with my stairs?  Do not leave any items on the stairs.  Make sure that there are handrails on both sides  of the stairs and use them. Fix handrails that are broken or loose. Make sure that handrails are as long as the stairways.  Check any carpeting to make sure that it is firmly attached to the stairs. Fix any carpet that is loose or worn.  Avoid having throw rugs at the top or bottom of the stairs. If you do have throw rugs, attach them to the floor with carpet tape.  Make sure that you have a light switch at the top of the stairs and the bottom of the stairs. If you do not have them, ask someone to add them for you. What else can I do to help prevent falls?  Wear shoes that:  Do not have high heels.  Have rubber bottoms.  Are comfortable and fit you well.  Are closed at the toe. Do not wear sandals.  If you use a stepladder:  Make sure that it is fully opened. Do not climb a closed stepladder.  Make sure that both sides of the stepladder are locked into place.  Ask someone to hold it for you, if possible.  Clearly mark and  make sure that you can see:  Any grab bars or handrails.  First and last steps.  Where the edge of each step is.  Use tools that help you move around (mobility aids) if they are needed. These include:  Canes.  Walkers.  Scooters.  Crutches.  Turn on the lights when you go into a dark area. Replace any light bulbs as soon as they burn out.  Set up your furniture so you have a clear path. Avoid moving your furniture around.  If any of your floors are uneven, fix them.  If there are any pets around you, be aware of where they are.  Review your medicines with your doctor. Some medicines can make you feel dizzy. This can increase your chance of falling. Ask your doctor what other things that you can do to help prevent falls. This information is not intended to replace advice given to you by your health care provider. Make sure you discuss any questions you have with your health care provider. Document Released: 11/01/2008 Document Revised: 06/13/2015 Document Reviewed: 02/09/2014 Elsevier Interactive Patient Education  2017 [employer].

## 2018-10-14 NOTE — Progress Notes (Signed)
PCP notes: none  Health Maintenance: declined Tdap     Abnormal Screenings: none    Patient concerns: none    Nurse concerns: none    Next PCP appt.: 10/19/2018 @ 2:20 pm

## 2018-10-14 NOTE — Progress Notes (Signed)
Remote pacemaker transmission.   

## 2018-10-17 ENCOUNTER — Telehealth: Payer: Medicare Other | Admitting: Internal Medicine

## 2018-10-17 LAB — PSA, TOTAL WITH REFLEX TO PSA, FREE: PSA, Total: <0.1 ng/mL (ref <=4.0)

## 2018-10-19 ENCOUNTER — Other Ambulatory Visit: Payer: Self-pay

## 2018-10-19 ENCOUNTER — Ambulatory Visit (INDEPENDENT_AMBULATORY_CARE_PROVIDER_SITE_OTHER): Payer: Medicare Other | Admitting: Family Medicine

## 2018-10-19 ENCOUNTER — Encounter: Payer: Self-pay | Admitting: Family Medicine

## 2018-10-19 VITALS — BP 128/70 | HR 99 | Temp 97.9°F | Ht 69.5 in | Wt 134.0 lb

## 2018-10-19 DIAGNOSIS — Z950 Presence of cardiac pacemaker: Secondary | ICD-10-CM | POA: Diagnosis not present

## 2018-10-19 DIAGNOSIS — Z9861 Coronary angioplasty status: Secondary | ICD-10-CM

## 2018-10-19 DIAGNOSIS — C3411 Malignant neoplasm of upper lobe, right bronchus or lung: Secondary | ICD-10-CM | POA: Diagnosis not present

## 2018-10-19 DIAGNOSIS — J438 Other emphysema: Secondary | ICD-10-CM

## 2018-10-19 DIAGNOSIS — I2510 Atherosclerotic heart disease of native coronary artery without angina pectoris: Secondary | ICD-10-CM

## 2018-10-19 NOTE — Progress Notes (Signed)
Patrick Sampson T. Patrick Maione, MD Primary Care and [place] at [facility] [place], [address] Phone: [phone]  FAX: [phone]  Patrick Sampson - 84 y.o. male  MRN [id number]  Date of Birth: Oct 24, 1934  Visit Date: 10/19/2018  PCP: Owens Loffler, MD  Referred by: Owens Loffler, MD  Chief Complaint  Patient presents with  . Annual Exam    Part 2   Subjective:   Patrick Sampson is a 84 y.o. very pleasant male patient who presents with the following:  Tolerating everying and surviving.  Limiting.  Two friends that he goes out with on Wed night outside and will have a beer.  Outside.  Globally he is doing reasonably well for somebody has had many different medical problems over the years.  He has had coronary disease, lung cancer, complete heart block, significant COPD, prostate cancer heart attacks as well as a cardiac pacemaker.  Despite all this he is doing reasonably well now  Breathing is doing ok and doing some breo Rare albuterol  Wt Readings from Last 3 Encounters:  10/19/18 134 lb (60.8 kg)  03/25/18 140 lb (63.5 kg)  10/18/17 134 lb 8 oz (61 kg)      Past Medical History, Surgical History, Social History, Family History, Problem List, Medications, and Allergies have been reviewed and updated if relevant.  Patient Active Problem List   Diagnosis Date Noted  . Status post placement of cardiac pacemaker 03/15/2015    Priority: High  . Complete heart block ([place]) 04/09/2014    Priority: High  . STEMI, s/p PCI 11/2008 12/02/2012    Priority: High  . moderate COPD with emphysema 02/24/2012    Priority: High  . CAD S/P CFX BMS 2010 12/19/2008    Priority: High  . Cancer of lung, upper lobe, Right, in remission 08/16/2006    Priority: High  . Prostate cancer, in Remission, 12/1998 Seeds 08/16/2006    Priority: High  . Raynauds disease     Priority: Medium  . Essential hypertension 02/12/2010   Priority: Medium  . HYPERLIPIDEMIA-MIXED 12/19/2008    Priority: Medium  . PERIPHERAL VASCULAR DISEASE 08/16/2006    Priority: Medium  . Macular degeneration (senile) of retina 10/18/2017  . RBBB 04/09/2014  . Aortic calcification ([place]) 12/02/2012  . Allergic rhinitis due to pollen   . HEMORRHOIDS 03/19/2010  . COLONIC POLYPS, ADENOMATOUS, HX OF 03/19/2010  . Tobacco abuse, in remission 12/19/2008  . AAA- 2.4 x 2/4 07/11/2007    Past Medical History:  Diagnosis Date  . AAA (abdominal aortic aneurysm) ([place])    a. Abdominal US 01/2011:  2.4 x 2.4 cm. Follow up recommended in 2 years.;  b. Abdominal US (01/2013):  Infrarenal AAA 2.4 x 2.4 cm  . Allergic rhinitis due to pollen   . Arthritis   . CAD (coronary artery disease) 11/2008   a. Inf STEMI 11/2008 => Mid to dist LAD 70-75%, mid CFX occluded, oRCA 50%, mRCA 50%, inf AK, EF 50% => PCI: overlapping BMSs to CFX;  b.  F/u LHC 01/2009:  mid LAD 50%, mid CFX stents patent. There was 50-70% stenosis at the prox edge of the stent; IVUS of the CFX and LAD with no hemodynamically significant stenoses=>Med Rx.;  c.  ETT-Myoview 8/14:  Inf and IL scar, no ischemia, low risk  . Cancer of lung, upper lobe, Right, in remission 2000  . Cataract   . Complete heart block ([place])  a. a/w syncope b. s/p STJ dual chamber pacemaker 03/2014  . COPD with emphysema ([place])   . Hyperlipidemia   . Macular degeneration (senile) of retina 10/18/2017  . Prostate cancer ([place]) 2000  . Raynauds disease   . Tinnitus of both ears     Past Surgical History:  Procedure Laterality Date  . ANGIOPLASTY / STENTING FEMORAL    . CATARACT EXTRACTION, BILATERAL    . COLONOSCOPY  2007  . LUNG LOBECTOMY Right 2000   Right upper lobectomy  . PERMANENT PACEMAKER INSERTION N/A 04/09/2014   Procedure: Daneil Dan;  Surgeon: Peter M Martinique, MD;  Location: [facility] CATH LAB;  Service: Cardiovascular;  Laterality: N/A;  . PERMANENT PACEMAKER INSERTION N/A 04/10/2014   STJ dual chamber  pacemaker implanted by Dr Rayann Heman for CHB  . POLYPECTOMY    . 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 Marital status: Married    Spouse name: Not on file  . Number of children: Not on file  . Years of education: Not on file  . Highest education level: Not on file  Occupational History  . Not on file  Social Needs  . Financial resource strain: Not hard at all  . Food insecurity    Worry: Never true    Inability: Never true  . Transportation needs    Medical: No    Non-medical: No  Tobacco Use  . Smoking status: Former Smoker    Years: 50.00    Types: Cigarettes    Quit date: 01/20/2007    Years since quitting: 11.7  . Smokeless tobacco: Never Used  Substance and Sexual Activity  . Alcohol use: Yes    Alcohol/week: 10.0 standard drinks    Types: 10 Cans of beer per week    Comment: moderate  . Drug use: No  . Sexual activity: Never  Lifestyle  . Physical activity    Days per week: 7 days    Minutes per session: 60 min  . Stress: Not at all  Relationships  . Social Herbalist on phone: Not on file    Gets together: Not on file    Attends religious service: Not on file    Active member of club or organization: Not on file    Attends meetings of clubs or organizations: Not on file    Relationship status: Not on file  . Intimate partner violence    Fear of current or ex partner: No    Emotionally abused: No    Physically abused: No    Forced sexual activity: No  Other Topics Concern  . Not on file  Social History Narrative  . Not on file    Family History  Problem Relation Age of Onset  . Colon cancer Sister 54       rectal cancer  . Lung cancer Father 42       smoker and coal mining  . Congestive Heart Failure Mother   . CAD Brother   . Tuberculosis Brother   . Kidney failure Brother        ESRD on dialysis  . Coronary artery disease  Brother        s/p CABG    Allergies  Allergen Reactions  . Amoxicillin Rash    Medication list reviewed and updated in full in [place].   GEN: No acute illnesses, no  fevers, chills. GI: No n/v/d, eating normally Pulm: No SOB Interactive and getting along well at home.  Otherwise, ROS is as per the HPI.  Objective:   BP 128/70   Pulse 99   Temp 97.9 F (36.6 C) (Temporal)   Ht 5' 9.5" (1.765 m)   Wt 134 lb (60.8 kg)   SpO2 94%   BMI 19.50 kg/m   GEN: WDWN, NAD, Non-toxic, A & O x 3 HEENT: Atraumatic, Normocephalic. Neck supple. No masses, No LAD. Ears and Nose: No external deformity. CV: RRR, No 2/6 murmur. No JVD. No thrill. No extra heart sounds. PULM: CTA B, no wheezes, crackles, rhonchi. No retractions. No resp. distress. No accessory muscle use. EXTR: No c/c/e NEURO Normal gait.  PSYCH: Normally interactive. Conversant. Not depressed or anxious appearing.  Calm demeanor.   Laboratory and Imaging Data: Results for orders placed or performed in visit on 10/14/18  PSA, Total with Reflex to PSA, Free  Result Value Ref Range   PSA, Total <0.1 < OR = 4.0 ng/mL  Hepatic function panel  Result Value Ref Range   Total Bilirubin 0.6 0.2 - 1.2 mg/dL   Bilirubin, Direct 0.1 0.0 - 0.3 mg/dL   Alkaline Phosphatase 82 39 - 117 U/L   AST 19 0 - 37 U/L   ALT 13 0 - 53 U/L   Total Protein 7.4 6.0 - 8.3 g/dL   Albumin 4.1 3.5 - 5.2 g/dL  Basic metabolic panel  Result Value Ref Range   Sodium 127 (L) 135 - 145 mEq/L   Potassium 5.2 No hemolysis seen (H) 3.5 - 5.1 mEq/L   Chloride 93 (L) 96 - 112 mEq/L   CO2 27 19 - 32 mEq/L   Glucose, Bld 103 (H) 70 - 99 mg/dL   BUN 13 6 - 23 mg/dL   Creatinine, Ser 0.68 0.40 - 1.50 mg/dL   Calcium 9.7 8.4 - 10.5 mg/dL   GFR 111.70 >60.00 mL/min  CBC with Differential/Platelet  Result Value Ref Range   WBC 9.3 4.0 - 10.5 K/uL   RBC 3.90 (L) 4.22 - 5.81 Mil/uL   Hemoglobin 13.8 13.0 - 17.0 g/dL   HCT 40.2 39.0 - 52.0 %    MCV 103.1 (H) 78.0 - 100.0 fl   MCHC 34.3 30.0 - 36.0 g/dL   RDW 13.9 11.5 - 15.5 %   Platelets 246.0 150.0 - 400.0 K/uL   Neutrophils Relative % 74.8 43.0 - 77.0 %   Lymphocytes Relative 12.8 12.0 - 46.0 %   Monocytes Relative 10.0 3.0 - 12.0 %   Eosinophils Relative 1.4 0.0 - 5.0 %   Basophils Relative 1.0 0.0 - 3.0 %   Neutro Abs 6.9 1.4 - 7.7 K/uL   Lymphs Abs 1.2 0.7 - 4.0 K/uL   Monocytes Absolute 0.9 0.1 - 1.0 K/uL   Eosinophils Absolute 0.1 0.0 - 0.7 K/uL   Basophils Absolute 0.1 0.0 - 0.1 K/uL  Lipid panel  Result Value Ref Range   Cholesterol 98 0 - 200 mg/dL   Triglycerides 65.0 0.0 - 149.0 mg/dL   HDL 52.60 >39.00 mg/dL   VLDL 13.0 0.0 - 40.0 mg/dL   LDL Cholesterol 32 0 - 99 mg/dL   Total CHOL/HDL Ratio 2    NonHDL 45.48      Assessment and Plan:     ICD-10-CM   1. Other emphysema ([place])  J43.8   2. CAD S/P CFX BMS 2010  I25.10    Z98.61   3. Malignant  neoplasm of upper lobe of right lung (HCC)  C34.11   4. Status post placement of cardiac pacemaker  Z95.0    I really would not change anything.  The patient is 84 years old he is doing quite well and living and taking care of himself without much difficulty.  Follow-up: Return in about 1 year (around 10/19/2019).  No orders of the defined types were placed in this encounter.  No orders of the defined types were placed in this encounter.   Signed,  Maud Deed. Cierria Height, MD   Outpatient Encounter Medications as of 10/19/2018  Medication Sig  . aspirin EC 81 MG EC tablet Take 1 tablet (81 mg total) by mouth daily.  Marland Kitchen BREO ELLIPTA 200-25 MCG/INH AEPB INHALE 1 PUFF BY MOUTH ONCE DAILY  . lisinopril (ZESTRIL) 2.5 MG tablet Take 1 tablet (2.5 mg total) by mouth daily.  . Lutein 10 MG TABS Take 10 mg by mouth 2 (two) times daily.    . metoprolol tartrate (LOPRESSOR) 25 MG tablet Take 1 tablet by mouth twice daily  . nitroGLYCERIN (NITROSTAT) 0.4 MG SL tablet Place 1 tablet (0.4 mg total) under the tongue every  5 (five) minutes as needed for chest pain.  . rosuvastatin (CRESTOR) 40 MG tablet TAKE 1/2 (ONE-HALF) TABLET BY MOUTH ONCE DAILY  . VENTOLIN HFA 108 (90 Base) MCG/ACT inhaler INHALE 1 TO 2 PUFFS BY MOUTH EVERY 6 HOURS AS NEEDED FOR WHEEZING AND FOR SHORTNESS OF BREATH   No facility-administered encounter medications on file as of 10/19/2018.

## 2018-11-09 ENCOUNTER — Other Ambulatory Visit: Payer: Self-pay | Admitting: Cardiology

## 2018-11-09 ENCOUNTER — Other Ambulatory Visit: Payer: Self-pay | Admitting: Family Medicine

## 2018-12-12 ENCOUNTER — Other Ambulatory Visit: Payer: Self-pay | Admitting: Family Medicine

## 2019-01-02 ENCOUNTER — Ambulatory Visit (INDEPENDENT_AMBULATORY_CARE_PROVIDER_SITE_OTHER): Payer: Medicare Other | Admitting: *Deleted

## 2019-01-02 DIAGNOSIS — I442 Atrioventricular block, complete: Secondary | ICD-10-CM | POA: Diagnosis not present

## 2019-01-02 DIAGNOSIS — H43811 Vitreous degeneration, right eye: Secondary | ICD-10-CM | POA: Diagnosis not present

## 2019-01-02 DIAGNOSIS — H353211 Exudative age-related macular degeneration, right eye, with active choroidal neovascularization: Secondary | ICD-10-CM | POA: Diagnosis not present

## 2019-01-02 DIAGNOSIS — H353132 Nonexudative age-related macular degeneration, bilateral, intermediate dry stage: Secondary | ICD-10-CM | POA: Diagnosis not present

## 2019-01-02 LAB — CUP PACEART REMOTE DEVICE CHECK
Battery Remaining Longevity: 130 mo
Battery Remaining Percentage: 95.5 %
Battery Voltage: 2.98 V
Brady Statistic AP VP Percent: 1 %
Brady Statistic AP VS Percent: 1 %
Brady Statistic AS VP Percent: 99 %
Brady Statistic AS VS Percent: 1 %
Brady Statistic RA Percent Paced: 1 %
Brady Statistic RV Percent Paced: 99 %
Date Time Interrogation Session: 20201214020013
Implantable Lead Implant Date: 20160322
Implantable Lead Implant Date: 20160322
Implantable Lead Location: 753859
Implantable Lead Location: 753860
Implantable Lead Model: 1948
Implantable Pulse Generator Implant Date: 20160322
Lead Channel Impedance Value: 390 Ohm
Lead Channel Impedance Value: 730 Ohm
Lead Channel Pacing Threshold Amplitude: 0.625 V
Lead Channel Pacing Threshold Amplitude: 0.75 V
Lead Channel Pacing Threshold Pulse Width: 0.4 ms
Lead Channel Pacing Threshold Pulse Width: 0.4 ms
Lead Channel Sensing Intrinsic Amplitude: 3.2 mV
Lead Channel Sensing Intrinsic Amplitude: 4 mV
Lead Channel Setting Pacing Amplitude: 0.875
Lead Channel Setting Pacing Amplitude: 2 V
Lead Channel Setting Pacing Pulse Width: 0.4 ms
Lead Channel Setting Sensing Sensitivity: 2.5 mV
Pulse Gen Model: 2240
Pulse Gen Serial Number: 7731685

## 2019-02-04 NOTE — Progress Notes (Signed)
PPM remote 

## 2019-03-13 ENCOUNTER — Other Ambulatory Visit: Payer: Self-pay | Admitting: Cardiology

## 2019-04-03 ENCOUNTER — Ambulatory Visit (INDEPENDENT_AMBULATORY_CARE_PROVIDER_SITE_OTHER): Payer: Medicare Other | Admitting: *Deleted

## 2019-04-03 DIAGNOSIS — I442 Atrioventricular block, complete: Secondary | ICD-10-CM | POA: Diagnosis not present

## 2019-04-03 LAB — CUP PACEART REMOTE DEVICE CHECK
Battery Remaining Longevity: 131 mo
Battery Remaining Percentage: 95.5 %
Battery Voltage: 2.98 V
Brady Statistic AP VP Percent: 1 %
Brady Statistic AP VS Percent: 1 %
Brady Statistic AS VP Percent: 99 %
Brady Statistic AS VS Percent: 1 %
Brady Statistic RA Percent Paced: 1 %
Brady Statistic RV Percent Paced: 99 %
Date Time Interrogation Session: 20210315030014
Implantable Lead Implant Date: 20160322
Implantable Lead Implant Date: 20160322
Implantable Lead Location: 753859
Implantable Lead Location: 753860
Implantable Lead Model: 1948
Implantable Pulse Generator Implant Date: 20160322
Lead Channel Impedance Value: 430 Ohm
Lead Channel Impedance Value: 760 Ohm
Lead Channel Pacing Threshold Amplitude: 0.625 V
Lead Channel Pacing Threshold Amplitude: 0.75 V
Lead Channel Pacing Threshold Pulse Width: 0.4 ms
Lead Channel Pacing Threshold Pulse Width: 0.4 ms
Lead Channel Sensing Intrinsic Amplitude: 4.5 mV
Lead Channel Sensing Intrinsic Amplitude: 9.9 mV
Lead Channel Setting Pacing Amplitude: 0.875
Lead Channel Setting Pacing Amplitude: 2 V
Lead Channel Setting Pacing Pulse Width: 0.4 ms
Lead Channel Setting Sensing Sensitivity: 2.5 mV
Pulse Gen Model: 2240
Pulse Gen Serial Number: 7731685

## 2019-04-03 NOTE — Progress Notes (Signed)
PPM Remote  

## 2019-04-05 NOTE — Progress Notes (Signed)
HPI: FU CAD, HTN, HL, AAA. He presented to the hospital in 11/10 with an inferior STEMI. LHC 11/2008: Mid to distal LAD 70-75%, mid CFX occluded, ostial RCA 50%, mid RCA 50%, inferior AK, EF 50%. He underwent overlapping bare-metal stents to the CFX at that time. Follow up [facility] 01/2009 demonstrated mid LAD 50%, mid CFX stents patent. There was 50-70% stenosis at the proximal edge of the stent. IVUS of the CFX and LAD demonstrated no hemodynamically significant stenoses. Continued medical therapy was recommended. Nuclear study 8/14 showed inferior scar and no ischemia. Echocardiogram March 2016 showed normal LV function and mild mitral regurgitation. Patient had complete heart block at that time and had pacemaker inserted. Abdominal US January 2020 showed abdominal aorta measuring 2.3 cm.  Since last seen,the patient has dyspnea with more extreme activities but not with routine activities. It is relieved with rest. It is not associated with chest pain. There is no orthopnea, PND or pedal edema. There is no syncope or palpitations. There is no exertional chest pain.   Current Outpatient Medications  Medication Sig Dispense Refill  . aspirin EC 81 MG EC tablet Take 1 tablet (81 mg total) by mouth daily. 30 tablet 0  . BREO ELLIPTA 200-25 MCG/INH AEPB Inhale 1 puff by mouth once daily 180 each 3  . lisinopril (ZESTRIL) 2.5 MG tablet Take 1 tablet (2.5 mg total) by mouth daily. 90 tablet 3  . Lutein 10 MG TABS Take 10 mg by mouth 2 (two) times daily.      . metoprolol tartrate (LOPRESSOR) 25 MG tablet Take 1 tablet (25 mg total) by mouth 2 (two) times daily. 180 tablet 0  . nitroGLYCERIN (NITROSTAT) 0.4 MG SL tablet Place 1 tablet (0.4 mg total) under the tongue every 5 (five) minutes as needed for chest pain. 25 tablet 12  . rosuvastatin (CRESTOR) 40 MG tablet Take 1/2 (one-half) tablet by mouth once daily 45 tablet 1  . VENTOLIN HFA 108 (90 Base) MCG/ACT inhaler INHALE 1 TO 2 PUFFS BY MOUTH EVERY 6  HOURS AS NEEDED FOR WHEEZING AND FOR SHORTNESS OF BREATH 18 g 3   No current facility-administered medications for this visit.     Past Medical History:  Diagnosis Date  . AAA (abdominal aortic aneurysm) ([place])    a. Abdominal US 01/2011:  2.4 x 2.4 cm. Follow up recommended in 2 years.;  b. Abdominal US (01/2013):  Infrarenal AAA 2.4 x 2.4 cm  . Allergic rhinitis due to pollen   . Arthritis   . CAD (coronary artery disease) 11/2008   a. Inf STEMI 11/2008 => Mid to dist LAD 70-75%, mid CFX occluded, oRCA 50%, mRCA 50%, inf AK, EF 50% => PCI: overlapping BMSs to CFX;  b.  F/u LHC 01/2009:  mid LAD 50%, mid CFX stents patent. There was 50-70% stenosis at the prox edge of the stent; IVUS of the CFX and LAD with no hemodynamically significant stenoses=>Med Rx.;  c.  ETT-Myoview 8/14:  Inf and IL scar, no ischemia, low risk  . Cancer of lung, upper lobe, Right, in remission 2000  . Cataract   . Complete heart block (HCC)    a. a/w syncope b. s/p STJ dual chamber pacemaker 03/2014  . COPD with emphysema ([place])   . Hyperlipidemia   . Macular degeneration (senile) of retina 10/18/2017  . Prostate cancer ([place]) 2000  . Raynauds disease   . Tinnitus of both ears     Past Surgical History:  Procedure Laterality Date  . ANGIOPLASTY / STENTING FEMORAL    . CATARACT EXTRACTION, BILATERAL    . COLONOSCOPY  2007  . LUNG LOBECTOMY Right 2000   Right upper lobectomy  . PERMANENT PACEMAKER INSERTION N/A 04/09/2014   Procedure: Daneil Dan;  Surgeon: Peter M Martinique, MD;  [facility] CATH LAB;  Service: Cardiovascular;  Laterality: N/A;  . PERMANENT PACEMAKER INSERTION N/A 04/10/2014   STJ dual chamber pacemaker implanted by Dr Rayann Heman for CHB  . POLYPECTOMY    . 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 Marital status: Married    Spouse name: Not on file  . Number of children: Not on file  . Years of  education: Not on file  . Highest education level: Not on file  Occupational History  . Not on file  Tobacco Use  . Smoking status: Former Smoker    Years: 50.00    Types: Cigarettes    Quit date: 01/20/2007    Years since quitting: 12.2  . Smokeless tobacco: Never Used  Substance and Sexual Activity  . Alcohol use: Yes    Alcohol/week: 10.0 standard drinks    Types: 10 Cans of beer per week    Comment: moderate  . Drug use: No  . Sexual activity: Never  Other Topics Concern  . Not on file  Social History Narrative  . Not on file   Social Determinants of Health   Financial Resource Strain: Low Risk   . Difficulty of Paying Living Expenses: Not hard at all  Food Insecurity: No Food Insecurity  . Worried About Charity fundraiser in the Last Year: Never true  . Ran Out of Food in the Last Year: Never true  Transportation Needs: No Transportation Needs  . Lack of Transportation (Medical): No  . Lack of Transportation (Non-Medical): No  Physical Activity: Sufficiently Active  . Days of Exercise per Week: 7 days  . Minutes of Exercise per Session: 60 min  Stress: No Stress Concern Present  . Feeling of Stress : Not at all  Social Connections:   . Frequency of Communication with Friends and Family:   . Frequency of Social Gatherings with Friends and Family:   . Attends Religious Services:   . Active Member of Clubs or Organizations:   . Attends Archivist Meetings:   Marland Kitchen Marital Status:   Intimate Partner Violence: Not At Risk  . Fear of Current or Ex-Partner: No  . Emotionally Abused: No  . Physically Abused: No  . Sexually Abused: No    Family History  Problem Relation Age of Onset  . Colon cancer Sister 27       rectal cancer  . Lung cancer Father 43       smoker and coal mining  . Congestive Heart Failure Mother   . CAD Brother   . Tuberculosis Brother   . Kidney failure Brother        ESRD on dialysis  . Coronary artery disease Brother        s/p  CABG    ROS: no fevers or chills, productive cough, hemoptysis, dysphasia, odynophagia, melena, hematochezia, dysuria, hematuria, rash, seizure activity, orthopnea, PND, pedal edema, claudication. Remaining systems are negative.  Physical Exam: Well-developed well-nourished in no acute distress.  Skin is warm and dry.  HEENT is normal.  Neck is supple.  Chest is clear  to auscultation with normal expansion.  Cardiovascular exam is regular rate and rhythm.  Abdominal exam nontender or distended. No masses palpated. Extremities show no edema. neuro grossly intact  ECG-sinus rhythm with ventricular pacing, biatrial enlargement.  Personally reviewed  A/P  1 coronary artery disease-patient denies recurrent chest pain.  Continue aspirin and statin.  2 hypertension-blood pressure controlled.  Continue present medical regimen.  3 hyperlipidemia-continue statin.  4 prior pacemaker-followed by electrophysiology.  5 history of abdominal aortic aneurysm-not evident on most recent ultrasound.  We will not pursue follow-up study at this point.  Kirk Ruths, MD

## 2019-04-13 ENCOUNTER — Encounter: Payer: Self-pay | Admitting: Cardiology

## 2019-04-13 ENCOUNTER — Other Ambulatory Visit: Payer: Self-pay

## 2019-04-13 ENCOUNTER — Ambulatory Visit (INDEPENDENT_AMBULATORY_CARE_PROVIDER_SITE_OTHER): Payer: Medicare Other | Admitting: Cardiology

## 2019-04-13 VITALS — BP 102/64 | HR 83 | Ht 71.0 in | Wt 138.4 lb

## 2019-04-13 DIAGNOSIS — I2510 Atherosclerotic heart disease of native coronary artery without angina pectoris: Secondary | ICD-10-CM

## 2019-04-13 DIAGNOSIS — I10 Essential (primary) hypertension: Secondary | ICD-10-CM

## 2019-04-13 DIAGNOSIS — E7800 Pure hypercholesterolemia, unspecified: Secondary | ICD-10-CM | POA: Diagnosis not present

## 2019-04-13 DIAGNOSIS — Z950 Presence of cardiac pacemaker: Secondary | ICD-10-CM

## 2019-04-13 NOTE — Patient Instructions (Signed)
Medication Instructions:  NO CHANGE *If you need a refill on your cardiac medications before your next appointment, please call your pharmacy*   Lab Work: If you have labs (blood work) drawn today and your tests are completely normal, you will receive your results only by: Marland Kitchen MyChart Message (if you have MyChart) OR . A paper copy in the mail If you have any lab test that is abnormal or we need to change your treatment, we will call you to review the results.   Follow-Up: At [facility], you and your health needs are our priority.  As part of our continuing mission to provide you with exceptional heart care, we have created designated Provider Care Teams.  These Care Teams include your primary Cardiologist (physician) and Advanced Practice Providers (APPs -  Physician Assistants and Nurse Practitioners) who all work together to provide you with the care you need, when you need it.  We recommend signing up for the patient portal called "MyChart".  Sign up information is provided on this After Visit Summary.  MyChart is used to connect with patients for Virtual Visits (Telemedicine).  Patients are able to view lab/test results, encounter notes, upcoming appointments, etc.  Non-urgent messages can be sent to your provider as well.   To learn more about what you can do with MyChart, go to [email or web address].    Your next appointment:   12 month(s)  The format for your next appointment:   Either In Person or Virtual  Provider:   You may see Kirk Ruths MD or one of the following Advanced Practice Providers on your designated Care Team:    Kerin Ransom, PA-C  [place], [place]  Coletta Memos, [place]

## 2019-05-02 ENCOUNTER — Encounter (INDEPENDENT_AMBULATORY_CARE_PROVIDER_SITE_OTHER): Payer: Medicare Other | Admitting: Ophthalmology

## 2019-05-08 ENCOUNTER — Other Ambulatory Visit: Payer: Self-pay | Admitting: Cardiology

## 2019-05-10 ENCOUNTER — Other Ambulatory Visit: Payer: Self-pay

## 2019-05-10 ENCOUNTER — Encounter (INDEPENDENT_AMBULATORY_CARE_PROVIDER_SITE_OTHER): Payer: Self-pay | Admitting: Ophthalmology

## 2019-05-10 ENCOUNTER — Ambulatory Visit (INDEPENDENT_AMBULATORY_CARE_PROVIDER_SITE_OTHER): Payer: Medicare Other | Admitting: Ophthalmology

## 2019-05-10 DIAGNOSIS — H43813 Vitreous degeneration, bilateral: Secondary | ICD-10-CM

## 2019-05-10 DIAGNOSIS — H353132 Nonexudative age-related macular degeneration, bilateral, intermediate dry stage: Secondary | ICD-10-CM

## 2019-05-10 DIAGNOSIS — Z961 Presence of intraocular lens: Secondary | ICD-10-CM | POA: Diagnosis not present

## 2019-05-10 DIAGNOSIS — H353211 Exudative age-related macular degeneration, right eye, with active choroidal neovascularization: Secondary | ICD-10-CM

## 2019-05-10 MED ORDER — BEVACIZUMAB CHEMO INJECTION 1.25MG/0.05ML SYRINGE FOR KALEIDOSCOPE
1.2500 mg | INTRAVITREAL | Status: AC | PRN
  Administered 2019-05-10: 15:00:00 1.25 mg via INTRAVITREAL

## 2019-05-10 NOTE — Progress Notes (Signed)
05/10/2019     CHIEF COMPLAINT Patient presents for Retina Follow Up   HISTORY OF PRESENT ILLNESS: Patrick Sampson is a 85 y.o. male who presents to the clinic today for:   HPI    Retina Follow Up    Patient presents with  Wet AMD.  In both eyes.  Severity is moderate.  Since onset it is stable.  I, the attending physician,  performed the HPI with the patient and updated documentation appropriately.          Comments    4 Month AMD f\u OU. Possible Avastin OD. OC  Pt feels OD vision has decreased within the past several weeks. Pt sees an occasional floater.       Last edited by Tilda Franco on 05/10/2019  1:14 PM. (History)      Referring physician: Owens Loffler, MD [place],  [place] [address]  HISTORICAL INFORMATION:   Selected notes from the MEDICAL RECORD NUMBER    Lab Results  Component Value Date   HGBA1C 6.5 (H) 07/08/2006     CURRENT MEDICATIONS: No current outpatient medications on file. (Ophthalmic Drugs)   No current facility-administered medications for this visit. (Ophthalmic Drugs)   Current Outpatient Medications (Other)  Medication Sig  . aspirin EC 81 MG EC tablet Take 1 tablet (81 mg total) by mouth daily.  Marland Kitchen BREO ELLIPTA 200-25 MCG/INH AEPB Inhale 1 puff by mouth once daily  . lisinopril (ZESTRIL) 2.5 MG tablet Take 1 tablet (2.5 mg total) by mouth daily.  . Lutein 10 MG TABS Take 10 mg by mouth 2 (two) times daily.    . metoprolol tartrate (LOPRESSOR) 25 MG tablet Take 1 tablet (25 mg total) by mouth 2 (two) times daily.  . nitroGLYCERIN (NITROSTAT) 0.4 MG SL tablet Place 1 tablet (0.4 mg total) under the tongue every 5 (five) minutes as needed for chest pain.  . rosuvastatin (CRESTOR) 40 MG tablet Take 1/2 (one-half) tablet by mouth once daily  . VENTOLIN HFA 108 (90 Base) MCG/ACT inhaler INHALE 1 TO 2 PUFFS BY MOUTH EVERY 6 HOURS AS NEEDED FOR WHEEZING AND FOR SHORTNESS OF BREATH   No current facility-administered  medications for this visit. (Other)      REVIEW OF SYSTEMS:    ALLERGIES Allergies  Allergen Reactions  . Amoxicillin Rash    PAST MEDICAL HISTORY Past Medical History:  Diagnosis Date  . AAA (abdominal aortic aneurysm) ([place])    a. Abdominal US 01/2011:  2.4 x 2.4 cm. Follow up recommended in 2 years.;  b. Abdominal US (01/2013):  Infrarenal AAA 2.4 x 2.4 cm  . Allergic rhinitis due to pollen   . Arthritis   . CAD (coronary artery disease) 11/2008   a. Inf STEMI 11/2008 => Mid to dist LAD 70-75%, mid CFX occluded, oRCA 50%, mRCA 50%, inf AK, EF 50% => PCI: overlapping BMSs to CFX;  b.  F/u LHC 01/2009:  mid LAD 50%, mid CFX stents patent. There was 50-70% stenosis at the prox edge of the stent; IVUS of the CFX and LAD with no hemodynamically significant stenoses=>Med Rx.;  c.  ETT-Myoview 8/14:  Inf and IL scar, no ischemia, low risk  . Cancer of lung, upper lobe, Right, in remission 2000  . Cataract   . Complete heart block (HCC)    a. a/w syncope b. s/p STJ dual chamber pacemaker 03/2014  . COPD with emphysema ([place])   . Hyperlipidemia   .  Macular degeneration (senile) of retina 10/18/2017  . Prostate cancer ([place]) 2000  . Raynauds disease   . Tinnitus of both ears    Past Surgical History:  Procedure Laterality Date  . ANGIOPLASTY / STENTING FEMORAL    . CATARACT EXTRACTION W/PHACO Right 09/19/2013   Dr. Gershon Crane  . CATARACT EXTRACTION W/PHACO Left 03/19/2012   Dr. Gershon Crane  . CATARACT EXTRACTION, BILATERAL    . COLONOSCOPY  2007  . LUNG LOBECTOMY Right 2000   Right upper lobectomy  . PERMANENT PACEMAKER INSERTION N/A 04/09/2014   Procedure: Daneil Dan;  Surgeon: Peter M Martinique, MD;  Location: [facility] CATH LAB;  Service: Cardiovascular;  Laterality: N/A;  . PERMANENT PACEMAKER INSERTION N/A 04/10/2014   STJ dual chamber pacemaker implanted by Dr Rayann Heman for CHB  . POLYPECTOMY    . TRANSPERINEAL IMPLANT OF RADIATION SEEDS W/ ULTRASOUND  2000   for cancer,rad.with seed plantation      FAMILY HISTORY Family History  Problem Relation Age of Onset  . Colon cancer Sister 22       rectal cancer  . Lung cancer Father 62       smoker and coal mining  . Congestive Heart Failure Mother   . CAD Brother   . Tuberculosis Brother   . Kidney failure Brother        ESRD on dialysis  . Coronary artery disease Brother        s/p CABG    SOCIAL HISTORY Social History   Tobacco Use  . Smoking status: Former Smoker    Years: 50.00    Types: Cigarettes    Quit date: 01/20/2007    Years since quitting: 12.3  . Smokeless tobacco: Never Used  Substance Use Topics  . Alcohol use: Yes    Alcohol/week: 10.0 standard drinks    Types: 10 Cans of beer per week    Comment: moderate  . Drug use: No         OPHTHALMIC EXAM: Base Eye Exam    Visual Acuity (Snellen - Linear)      Right Left   Dist [place] 20/40 -1 20/25   Dist ph cc NI    Correction: Glasses       Tonometry (Tonopen, 1:20 PM)      Right Left   Pressure 15 17       Pupils      Pupils Dark Light Shape React APD   Right PERRL 4 3 Round Brisk None   Left PERRL 4 3 Round Brisk None       Visual Fields (Counting fingers)      Left Right    Full Full       Neuro/Psych    Oriented x3: Yes   Mood/Affect: Normal       Dilation    Both eyes: 1.0% Mydriacyl, 2.5% Phenylephrine @ 1:20 PM        Slit Lamp and Fundus Exam    Slit Lamp Exam      Right Left   Lens Posterior chamber intraocular lens Posterior chamber intraocular lens          IMAGING AND PROCEDURES  Imaging and Procedures for @TODAY @           ASSESSMENT/PLAN:  No problem-specific Assessment & Plan notes found for this encounter.      ICD-10-CM   1. Intermediate stage nonexudative age-related macular degeneration of both eyes  H35.3132 OCT, Retina - OU - Both Eyes  2. Exudative age-related macular  degeneration of right eye with active choroidal neovascularization ([place])  H35.3211 OCT, Retina - OU - Both Eyes  3.  Pseudophakia  Z96.1     1.  2.  3.  Ophthalmic Meds Ordered this visit:  No orders of the defined types were placed in this encounter.      No follow-ups on file.  There are no Patient Instructions on file for this visit.   Explained the diagnoses, plan, and follow up with the patient and they expressed understanding.  Patient expressed understanding of the importance of proper follow up care.   Clent Demark Amoni Morales M.D. Diseases & Surgery of the Retina and Vitreous Retina & Diabetic [place] @TODAY @     Abbreviations: M myopia (nearsighted); A astigmatism; H hyperopia (farsighted); P presbyopia; Mrx spectacle prescription;  CTL contact lenses; OD right eye; OS left eye; OU both eyes  XT exotropia; ET esotropia; PEK punctate epithelial keratitis; PEE punctate epithelial erosions; DES dry eye syndrome; MGD meibomian gland dysfunction; ATs artificial tears; PFAT's preservative free artificial tears; [place] nuclear sclerotic cataract; PSC posterior subcapsular cataract; ERM epi-retinal membrane; PVD posterior vitreous detachment; RD retinal detachment; DM diabetes mellitus; DR diabetic retinopathy; NPDR non-proliferative diabetic retinopathy; PDR proliferative diabetic retinopathy; CSME clinically significant macular edema; DME diabetic macular edema; dbh dot blot hemorrhages; CWS cotton wool spot; POAG primary open angle glaucoma; C/D cup-to-disc ratio; HVF humphrey visual field; GVF goldmann visual field; OCT optical coherence tomography; IOP intraocular pressure; BRVO Branch retinal vein occlusion; CRVO central retinal vein occlusion; CRAO central retinal artery occlusion; BRAO branch retinal artery occlusion; RT retinal tear; SB scleral buckle; PPV pars plana vitrectomy; VH Vitreous hemorrhage; PRP panretinal laser photocoagulation; IVK intravitreal kenalog; VMT vitreomacular traction; MH Macular hole;  NVD neovascularization of the disc; NVE neovascularization elsewhere; AREDS age related  eye disease study; ARMD age related macular degeneration; POAG primary open angle glaucoma; EBMD epithelial/anterior basement membrane dystrophy; ACIOL anterior chamber intraocular lens; IOL intraocular lens; PCIOL posterior chamber intraocular lens; Phaco/IOL phacoemulsification with intraocular lens placement; [place] photorefractive keratectomy; LASIK laser assisted in situ keratomileusis; HTN hypertension; DM diabetes mellitus; COPD chronic obstructive pulmonary disease

## 2019-05-10 NOTE — Assessment & Plan Note (Signed)
The nature of wet macular degeneration was discussed with the patient.  Forms of therapy reviewed include the use of Anti-VEGF medications injected painlessly into the eye, as well as other possible treatment modalities, including thermal laser therapy. Fellow eye involvement and risks were discussed with the patient. Upon the finding of wet age related macular degeneration, treatment will be offered. The treatment regimen is on a treat as needed basis with the intent to treat if necessary and extend interval of exams when possible. On average 1 out of 6 patients do not need lifetime therapy. However, the risk of recurrent disease is high for a lifetime.  Initially monthly, then periodic, examinations and evaluations will determine whether the next treatment is required on the day of the examination.  Currently at 18-week interval, now with 2 to 3 weeks of distorted vision OD.  Tiny subretinal fluid noted only on OCT examination suggest some progression.  Will repeat today and examination in 5 weeks and likely treat

## 2019-05-30 ENCOUNTER — Other Ambulatory Visit: Payer: Self-pay | Admitting: Cardiology

## 2019-06-15 ENCOUNTER — Encounter (INDEPENDENT_AMBULATORY_CARE_PROVIDER_SITE_OTHER): Payer: Medicare Other | Admitting: Ophthalmology

## 2019-06-20 ENCOUNTER — Other Ambulatory Visit: Payer: Self-pay | Admitting: Cardiology

## 2019-06-22 ENCOUNTER — Other Ambulatory Visit: Payer: Self-pay | Admitting: Cardiology

## 2019-06-29 ENCOUNTER — Ambulatory Visit (INDEPENDENT_AMBULATORY_CARE_PROVIDER_SITE_OTHER): Payer: Medicare Other | Admitting: Ophthalmology

## 2019-06-29 ENCOUNTER — Other Ambulatory Visit: Payer: Self-pay

## 2019-06-29 ENCOUNTER — Encounter (INDEPENDENT_AMBULATORY_CARE_PROVIDER_SITE_OTHER): Payer: Self-pay | Admitting: Ophthalmology

## 2019-06-29 DIAGNOSIS — H353211 Exudative age-related macular degeneration, right eye, with active choroidal neovascularization: Secondary | ICD-10-CM | POA: Diagnosis not present

## 2019-06-29 MED ORDER — BEVACIZUMAB CHEMO INJECTION 1.25MG/0.05ML SYRINGE FOR KALEIDOSCOPE
1.2500 mg | INTRAVITREAL | Status: AC | PRN
  Administered 2019-06-29: 1.25 mg via INTRAVITREAL

## 2019-06-29 NOTE — Progress Notes (Signed)
06/29/2019     CHIEF COMPLAINT Patient presents for Retina Follow Up   HISTORY OF PRESENT ILLNESS: Patrick Sampson is a 85 y.o. male who presents to the clinic today for:   HPI    Retina Follow Up    Patient presents with  Wet AMD.  In right eye.  Since onset it is gradually improving.  I, the attending physician,  performed the HPI with the patient and updated documentation appropriately.          Comments    7 Week AMD f\u OD. Possible Avastin OD. OCT  Pt states vision has not improved but is not any worse.       Last edited by Hurman Horn, MD on 06/29/2019  2:50 PM. (History)      Referring physician: Owens Loffler, MD [place],  [place] [address]  HISTORICAL INFORMATION:   Selected notes from the MEDICAL RECORD NUMBER    Lab Results  Component Value Date   HGBA1C 6.5 (H) 07/08/2006     CURRENT MEDICATIONS: No current outpatient medications on file. (Ophthalmic Drugs)   No current facility-administered medications for this visit. (Ophthalmic Drugs)   Current Outpatient Medications (Other)  Medication Sig  . aspirin EC 81 MG EC tablet Take 1 tablet (81 mg total) by mouth daily.  Marland Kitchen BREO ELLIPTA 200-25 MCG/INH AEPB Inhale 1 puff by mouth once daily  . lisinopril (ZESTRIL) 2.5 MG tablet Take 1 tablet by mouth once daily  . Lutein 10 MG TABS Take 10 mg by mouth 2 (two) times daily.    . metoprolol tartrate (LOPRESSOR) 25 MG tablet Take 1 tablet by mouth twice daily  . nitroGLYCERIN (NITROSTAT) 0.4 MG SL tablet Place 1 tablet (0.4 mg total) under the tongue every 5 (five) minutes as needed for chest pain.  . rosuvastatin (CRESTOR) 40 MG tablet Take 1/2 (one-half) tablet by mouth once daily  . VENTOLIN HFA 108 (90 Base) MCG/ACT inhaler INHALE 1 TO 2 PUFFS BY MOUTH EVERY 6 HOURS AS NEEDED FOR WHEEZING AND FOR SHORTNESS OF BREATH   No current facility-administered medications for this visit. (Other)      REVIEW OF  SYSTEMS:    ALLERGIES Allergies  Allergen Reactions  . Amoxicillin Rash    PAST MEDICAL HISTORY Past Medical History:  Diagnosis Date  . AAA (abdominal aortic aneurysm) ([place])    a. Abdominal US 01/2011:  2.4 x 2.4 cm. Follow up recommended in 2 years.;  b. Abdominal US (01/2013):  Infrarenal AAA 2.4 x 2.4 cm  . Allergic rhinitis due to pollen   . Arthritis   . CAD (coronary artery disease) 11/2008   a. Inf STEMI 11/2008 => Mid to dist LAD 70-75%, mid CFX occluded, oRCA 50%, mRCA 50%, inf AK, EF 50% => PCI: overlapping BMSs to CFX;  b.  F/u LHC 01/2009:  mid LAD 50%, mid CFX stents patent. There was 50-70% stenosis at the prox edge of the stent; IVUS of the CFX and LAD with no hemodynamically significant stenoses=>Med Rx.;  c.  ETT-Myoview 8/14:  Inf and IL scar, no ischemia, low risk  . Cancer of lung, upper lobe, Right, in remission 2000  . Cataract   . Complete heart block (HCC)    a. a/w syncope b. s/p STJ dual chamber pacemaker 03/2014  . COPD with emphysema ([place])   . Hyperlipidemia   . Macular degeneration (senile) of retina 10/18/2017  . Prostate cancer ([place]) 2000  .  Raynauds disease   . Tinnitus of both ears    Past Surgical History:  Procedure Laterality Date  . ANGIOPLASTY / STENTING FEMORAL    . CATARACT EXTRACTION W/PHACO Right 09/19/2013   Dr. Gershon Crane  . CATARACT EXTRACTION W/PHACO Left 03/19/2012   Dr. Gershon Crane  . CATARACT EXTRACTION, BILATERAL    . COLONOSCOPY  2007  . LUNG LOBECTOMY Right 2000   Right upper lobectomy  . PERMANENT PACEMAKER INSERTION N/A 04/09/2014   Procedure: Daneil Dan;  Surgeon: Peter M Martinique, MD;  Location: [facility] CATH LAB;  Service: Cardiovascular;  Laterality: N/A;  . PERMANENT PACEMAKER INSERTION N/A 04/10/2014   STJ dual chamber pacemaker implanted by Dr Rayann Heman for CHB  . POLYPECTOMY    . TRANSPERINEAL IMPLANT OF RADIATION SEEDS W/ ULTRASOUND  2000   for cancer,rad.with seed plantation    FAMILY HISTORY Family History  Problem Relation  Age of Onset  . Colon cancer Sister 14       rectal cancer  . Lung cancer Father 2       smoker and coal mining  . Congestive Heart Failure Mother   . CAD Brother   . Tuberculosis Brother   . Kidney failure Brother        ESRD on dialysis  . Coronary artery disease Brother        s/p CABG    SOCIAL HISTORY Social History   Tobacco Use  . Smoking status: Former Smoker    Years: 50.00    Types: Cigarettes    Quit date: 01/20/2007    Years since quitting: 12.4  . Smokeless tobacco: Never Used  Vaping Use  . Vaping Use: Never used  Substance Use Topics  . Alcohol use: Yes    Alcohol/week: 10.0 standard drinks    Types: 10 Cans of beer per week    Comment: moderate  . Drug use: No         OPHTHALMIC EXAM:  Base Eye Exam    Visual Acuity (Snellen - Linear)      Right Left   Dist [place] 20/40 -1 20/20 -1   Dist ph [place] NI        Tonometry (Tonopen, 1:52 PM)      Right Left   Pressure 16 13       Pupils      Pupils Dark Light Shape React APD   Right PERRL 4 3 Round Brisk None   Left PERRL 4 3 Round Brisk None       Visual Fields (Counting fingers)      Left Right    Full Full       Neuro/Psych    Oriented x3: Yes   Mood/Affect: Normal       Dilation    Right eye: 1.0% Mydriacyl, 2.5% Phenylephrine @ 1:52 PM        Slit Lamp and Fundus Exam    External Exam      Right Left   External Normal Normal       Slit Lamp Exam      Right Left   Lids/Lashes Normal Normal   Conjunctiva/Sclera White and quiet White and quiet   Cornea Clear Clear   Anterior Chamber Deep and quiet Deep and quiet   Iris Round and reactive Round and reactive   Lens Posterior chamber intraocular lens Posterior chamber intraocular lens   Anterior Vitreous Normal Normal       Fundus Exam      Right Left  Posterior Vitreous Posterior vitreous detachment    Disc Normal    C/D Ratio 0.1    Macula Retinal pigment epithelial mottling, Soft drusen, no macular thickening, no  hemorrhage, no exudates    Vessels Normal    Periphery Normal           IMAGING AND PROCEDURES  Imaging and Procedures for 06/29/19  OCT, Retina - OU - Both Eyes       Right Eye Quality was good. Scan locations included subfoveal. Central Foveal Thickness: 296. Progression has improved. Findings include retinal drusen .   Left Eye Quality was good. Scan locations included subfoveal. Central Foveal Thickness: 276. Progression has improved. Findings include retinal drusen .   Notes Subfoveal drusenoid RPE detachment with fluid now improved OD some 7 weeks post intravitreal Avastin.  Will repeat OD today and examination in 8 weeks       Intravitreal Injection, Pharmacologic Agent - OD - Right Eye       Time Out 06/29/2019. 2:51 PM. Confirmed correct patient, procedure, site, and patient consented.   Anesthesia Topical anesthesia was used. Anesthetic medications included [place] 3.5%.   Procedure Preparation included Tobramycin 0.3%. A 30 gauge needle was used.   Injection:  1.25 mg Bevacizumab (AVASTIN) SOLN   NDC: [address]-1, Lot: [id number]   Route: Intravitreal, Site: Right Eye, Waste: 0 mg  Post-op Post injection exam found visual acuity of at least counting fingers. The patient tolerated the procedure well. There were no complications. The patient received written and verbal post procedure care education.                 ASSESSMENT/PLAN:  Exudative age-related macular degeneration of right eye with active choroidal neovascularization (HCC) OD improved 7-week post intravitreal Avastin will repeat today and examination in 8 weeks      ICD-10-CM   1. Exudative age-related macular degeneration of right eye with active choroidal neovascularization (HCC)  H35.3211 OCT, Retina - OU - Both Eyes    Intravitreal Injection, Pharmacologic Agent - OD - Right Eye    Bevacizumab (AVASTIN) SOLN 1.25 mg    1.  Repeat intravitreal Avastin OD today and examination in 8  weeks  2.  3.  Ophthalmic Meds Ordered this visit:  Meds ordered this encounter  Medications  . Bevacizumab (AVASTIN) SOLN 1.25 mg       Return in about 8 weeks (around 08/24/2019) for dilate, OD, AVASTIN OCT.  There are no Patient Instructions on file for this visit.   Explained the diagnoses, plan, and follow up with the patient and they expressed understanding.  Patient expressed understanding of the importance of proper follow up care.   Clent Demark Edmar Blankenburg M.D. Diseases & Surgery of the Retina and Vitreous Retina & Diabetic [place] 06/29/19     Abbreviations: M myopia (nearsighted); A astigmatism; H hyperopia (farsighted); P presbyopia; Mrx spectacle prescription;  CTL contact lenses; OD right eye; OS left eye; OU both eyes  XT exotropia; ET esotropia; PEK punctate epithelial keratitis; PEE punctate epithelial erosions; DES dry eye syndrome; MGD meibomian gland dysfunction; ATs artificial tears; PFAT's preservative free artificial tears; [place] nuclear sclerotic cataract; PSC posterior subcapsular cataract; ERM epi-retinal membrane; PVD posterior vitreous detachment; RD retinal detachment; DM diabetes mellitus; DR diabetic retinopathy; NPDR non-proliferative diabetic retinopathy; PDR proliferative diabetic retinopathy; CSME clinically significant macular edema; DME diabetic macular edema; dbh dot blot hemorrhages; CWS cotton wool spot; POAG primary open angle glaucoma; C/D cup-to-disc ratio; HVF humphrey visual field; GVF goldmann  visual field; OCT optical coherence tomography; IOP intraocular pressure; BRVO Branch retinal vein occlusion; CRVO central retinal vein occlusion; CRAO central retinal artery occlusion; BRAO branch retinal artery occlusion; RT retinal tear; SB scleral buckle; PPV pars plana vitrectomy; VH Vitreous hemorrhage; PRP panretinal laser photocoagulation; IVK intravitreal kenalog; VMT vitreomacular traction; MH Macular hole;  NVD neovascularization of the disc; NVE  neovascularization elsewhere; AREDS age related eye disease study; ARMD age related macular degeneration; POAG primary open angle glaucoma; EBMD epithelial/anterior basement membrane dystrophy; ACIOL anterior chamber intraocular lens; IOL intraocular lens; PCIOL posterior chamber intraocular lens; Phaco/IOL phacoemulsification with intraocular lens placement; [place] photorefractive keratectomy; LASIK laser assisted in situ keratomileusis; HTN hypertension; DM diabetes mellitus; COPD chronic obstructive pulmonary disease

## 2019-06-29 NOTE — Assessment & Plan Note (Signed)
OD improved 7-week post intravitreal Avastin will repeat today and examination in 8 weeks

## 2019-07-03 ENCOUNTER — Ambulatory Visit (INDEPENDENT_AMBULATORY_CARE_PROVIDER_SITE_OTHER): Payer: Medicare Other | Admitting: *Deleted

## 2019-07-03 DIAGNOSIS — I442 Atrioventricular block, complete: Secondary | ICD-10-CM | POA: Diagnosis not present

## 2019-07-03 LAB — CUP PACEART REMOTE DEVICE CHECK
Battery Remaining Longevity: 131 mo
Battery Remaining Percentage: 95.5 %
Battery Voltage: 2.98 V
Brady Statistic AP VP Percent: 1 %
Brady Statistic AP VS Percent: 1 %
Brady Statistic AS VP Percent: 99 %
Brady Statistic AS VS Percent: 1 %
Brady Statistic RA Percent Paced: 1 %
Brady Statistic RV Percent Paced: 99 %
Date Time Interrogation Session: 20210614024116
Implantable Lead Implant Date: 20160322
Implantable Lead Implant Date: 20160322
Implantable Lead Location: 753859
Implantable Lead Location: 753860
Implantable Lead Model: 1948
Implantable Pulse Generator Implant Date: 20160322
Lead Channel Impedance Value: 440 Ohm
Lead Channel Impedance Value: 800 Ohm
Lead Channel Pacing Threshold Amplitude: 0.625 V
Lead Channel Pacing Threshold Amplitude: 0.75 V
Lead Channel Pacing Threshold Pulse Width: 0.4 ms
Lead Channel Pacing Threshold Pulse Width: 0.4 ms
Lead Channel Sensing Intrinsic Amplitude: 5 mV
Lead Channel Sensing Intrinsic Amplitude: 9.9 mV
Lead Channel Setting Pacing Amplitude: 0.875
Lead Channel Setting Pacing Amplitude: 2 V
Lead Channel Setting Pacing Pulse Width: 0.4 ms
Lead Channel Setting Sensing Sensitivity: 2.5 mV
Pulse Gen Model: 2240
Pulse Gen Serial Number: 7731685

## 2019-07-03 NOTE — Progress Notes (Signed)
Remote pacemaker transmission.   

## 2019-08-24 ENCOUNTER — Encounter (INDEPENDENT_AMBULATORY_CARE_PROVIDER_SITE_OTHER): Payer: Self-pay | Admitting: Ophthalmology

## 2019-08-24 ENCOUNTER — Ambulatory Visit (INDEPENDENT_AMBULATORY_CARE_PROVIDER_SITE_OTHER): Payer: Medicare Other | Admitting: Ophthalmology

## 2019-08-24 ENCOUNTER — Other Ambulatory Visit: Payer: Self-pay

## 2019-08-24 DIAGNOSIS — H353211 Exudative age-related macular degeneration, right eye, with active choroidal neovascularization: Secondary | ICD-10-CM

## 2019-08-24 MED ORDER — BEVACIZUMAB CHEMO INJECTION 1.25MG/0.05ML SYRINGE FOR KALEIDOSCOPE
1.2500 mg | INTRAVITREAL | Status: AC | PRN
  Administered 2019-08-24: 1.25 mg via INTRAVITREAL

## 2019-08-24 NOTE — Assessment & Plan Note (Signed)
Chronic peripapillary CNVM, with less subretinal fluid now yet still present, at 8-week interval post intravitreal Avastin.  Pete injection today OD and examination OU 9 weeks

## 2019-08-24 NOTE — Progress Notes (Signed)
08/24/2019     CHIEF COMPLAINT Patient presents for Retina Follow Up   HISTORY OF PRESENT ILLNESS: Patrick Sampson is a 85 y.o. male who presents to the clinic today for:   HPI    Retina Follow Up    Patient presents with  Wet AMD.  In right eye.  Severity is moderate.  Duration of 8 weeks.  Since onset it is stable.  I, the attending physician,  performed the HPI with the patient and updated documentation appropriately.          Comments    8 Week Wet AMD f\u OD. Possible Avastin OD. OCT  Pt states vision is stable. Pt sees an occasional floater in OD.       Last edited by Tilda Franco on 08/24/2019  1:11 PM. (History)      Referring physician: Owens Loffler, MD [place],  [place] [address]  HISTORICAL INFORMATION:   Selected notes from the MEDICAL RECORD NUMBER    Lab Results  Component Value Date   HGBA1C 6.5 (H) 07/08/2006     CURRENT MEDICATIONS: No current outpatient medications on file. (Ophthalmic Drugs)   No current facility-administered medications for this visit. (Ophthalmic Drugs)   Current Outpatient Medications (Other)  Medication Sig  . aspirin EC 81 MG EC tablet Take 1 tablet (81 mg total) by mouth daily.  Marland Kitchen BREO ELLIPTA 200-25 MCG/INH AEPB Inhale 1 puff by mouth once daily  . lisinopril (ZESTRIL) 2.5 MG tablet Take 1 tablet by mouth once daily  . Lutein 10 MG TABS Take 10 mg by mouth 2 (two) times daily.    . metoprolol tartrate (LOPRESSOR) 25 MG tablet Take 1 tablet by mouth twice daily  . nitroGLYCERIN (NITROSTAT) 0.4 MG SL tablet Place 1 tablet (0.4 mg total) under the tongue every 5 (five) minutes as needed for chest pain.  . rosuvastatin (CRESTOR) 40 MG tablet Take 1/2 (one-half) tablet by mouth once daily  . VENTOLIN HFA 108 (90 Base) MCG/ACT inhaler INHALE 1 TO 2 PUFFS BY MOUTH EVERY 6 HOURS AS NEEDED FOR WHEEZING AND FOR SHORTNESS OF BREATH   No current facility-administered medications for this visit. (Other)       REVIEW OF SYSTEMS:    ALLERGIES Allergies  Allergen Reactions  . Amoxicillin Rash    PAST MEDICAL HISTORY Past Medical History:  Diagnosis Date  . AAA (abdominal aortic aneurysm) ([place])    a. Abdominal US 01/2011:  2.4 x 2.4 cm. Follow up recommended in 2 years.;  b. Abdominal US (01/2013):  Infrarenal AAA 2.4 x 2.4 cm  . Allergic rhinitis due to pollen   . Arthritis   . CAD (coronary artery disease) 11/2008   a. Inf STEMI 11/2008 => Mid to dist LAD 70-75%, mid CFX occluded, oRCA 50%, mRCA 50%, inf AK, EF 50% => PCI: overlapping BMSs to CFX;  b.  F/u LHC 01/2009:  mid LAD 50%, mid CFX stents patent. There was 50-70% stenosis at the prox edge of the stent; IVUS of the CFX and LAD with no hemodynamically significant stenoses=>Med Rx.;  c.  ETT-Myoview 8/14:  Inf and IL scar, no ischemia, low risk  . Cancer of lung, upper lobe, Right, in remission 2000  . Cataract   . Complete heart block (HCC)    a. a/w syncope b. s/p STJ dual chamber pacemaker 03/2014  . COPD with emphysema ([place])   . Hyperlipidemia   . Macular degeneration (senile) of retina  10/18/2017  . Prostate cancer ([place]) 2000  . Raynauds disease   . Tinnitus of both ears    Past Surgical History:  Procedure Laterality Date  . ANGIOPLASTY / STENTING FEMORAL    . CATARACT EXTRACTION W/PHACO Right 09/19/2013   Dr. Gershon Crane  . CATARACT EXTRACTION W/PHACO Left 03/19/2012   Dr. Gershon Crane  . CATARACT EXTRACTION, BILATERAL    . COLONOSCOPY  2007  . LUNG LOBECTOMY Right 2000   Right upper lobectomy  . PERMANENT PACEMAKER INSERTION N/A 04/09/2014   Procedure: Daneil Dan;  Surgeon: Peter M Martinique, MD;  Location: [facility] CATH LAB;  Service: Cardiovascular;  Laterality: N/A;  . PERMANENT PACEMAKER INSERTION N/A 04/10/2014   STJ dual chamber pacemaker implanted by Dr Rayann Heman for CHB  . POLYPECTOMY    . TRANSPERINEAL IMPLANT OF RADIATION SEEDS W/ ULTRASOUND  2000   for cancer,rad.with seed plantation    FAMILY HISTORY Family History   Problem Relation Age of Onset  . Colon cancer Sister 67       rectal cancer  . Lung cancer Father 63       smoker and coal mining  . Congestive Heart Failure Mother   . CAD Brother   . Tuberculosis Brother   . Kidney failure Brother        ESRD on dialysis  . Coronary artery disease Brother        s/p CABG    SOCIAL HISTORY Social History   Tobacco Use  . Smoking status: Former Smoker    Years: 50.00    Types: Cigarettes    Quit date: 01/20/2007    Years since quitting: 12.6  . Smokeless tobacco: Never Used  Vaping Use  . Vaping Use: Never used  Substance Use Topics  . Alcohol use: Yes    Alcohol/week: 10.0 standard drinks    Types: 10 Cans of beer per week    Comment: moderate  . Drug use: No         OPHTHALMIC EXAM:  Base Eye Exam    Visual Acuity (Snellen - Linear)      Right Left   Dist cc 20/50 -1 20/25   Dist ph cc NI    Correction: Glasses       Tonometry (Tonopen, 1:15 PM)      Right Left   Pressure 12 10       Pupils      Pupils Dark Light Shape React APD   Right PERRL 4 3 Round Brisk None   Left PERRL 4 3 Round Brisk None       Visual Fields (Counting fingers)      Left Right    Full Full       Neuro/Psych    Oriented x3: Yes   Mood/Affect: Normal       Dilation    Right eye: 1.0% Mydriacyl, 2.5% Phenylephrine @ 1:15 PM        Slit Lamp and Fundus Exam    External Exam      Right Left   External Normal Normal       Slit Lamp Exam      Right Left   Lids/Lashes Normal Normal   Conjunctiva/Sclera White and quiet White and quiet   Cornea Clear Clear   Anterior Chamber Deep and quiet Deep and quiet   Iris Round and reactive Round and reactive   Lens Posterior chamber intraocular lens Posterior chamber intraocular lens   Anterior Vitreous Normal Normal  Fundus Exam      Right Left   Posterior Vitreous Posterior vitreous detachment    Disc Normal    C/D Ratio 0.1    Macula Retinal pigment epithelial mottling, Soft  drusen, no macular thickening, no hemorrhage, no exudates    Vessels Normal    Periphery Normal           IMAGING AND PROCEDURES  Imaging and Procedures for 08/24/19  OCT, Retina - OU - Both Eyes       Right Eye Quality was good. Scan locations included subfoveal. Central Foveal Thickness: 295. Progression has been stable. Findings include retinal drusen .   Left Eye Quality was good. Scan locations included subfoveal. Central Foveal Thickness: 277. Progression has been stable. Findings include retinal drusen .   Notes Subfoveal drusenoid RPE detachment with fluid now improved OD some 8 weeks post intravitreal Avastin.  Will repeat OD today and examination in 8 weeks       Intravitreal Injection, Pharmacologic Agent - OD - Right Eye       Time Out 08/24/2019. 2:04 PM. Confirmed correct patient, procedure, site, and patient consented.   Anesthesia Topical anesthesia was used. Anesthetic medications included Ak[place] 3.5%.   Procedure Preparation included 10% betadine to eyelids, 5% betadine to ocular surface. A 30 gauge needle was used.   Injection:  1.25 mg Bevacizumab (AVASTIN) SOLN   NDC: [address]-0   Route: Intravitreal, Site: Right Eye, Waste: 0 mg  Post-op Post injection exam found visual acuity of at least counting fingers. The patient tolerated the procedure well. There were no complications. The patient received written and verbal post procedure care education. Post injection medications were not given.                 ASSESSMENT/PLAN:  Exudative age-related macular degeneration of right eye with active choroidal neovascularization (HCC) Chronic peripapillary CNVM, with less subretinal fluid now yet still present, at 8-week interval post intravitreal Avastin.  Pete injection today OD and examination OU 9 weeks      ICD-10-CM   1. 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  Overall stable exudative CNVM, ARMD right eye from peripapillary location  2.  Repeat intravitreal Avastin OD today  3.  Light OU next and 8-week examination interval  Ophthalmic Meds Ordered this visit:  Meds ordered this encounter  Medications  . Bevacizumab (AVASTIN) SOLN 1.25 mg       Return in about 8 weeks (around 10/19/2019) for COLOR FP, DILATE OU, AVASTIN OCT, OD.  There are no Patient Instructions on file for this visit.   Explained the diagnoses, plan, and follow up with the patient and they expressed understanding.  Patient expressed understanding of the importance of proper follow up care.   Clent Demark Waylan Busta M.D. Diseases & Surgery of the Retina and Vitreous [facility] [place] 08/24/19     Abbreviations: M myopia (nearsighted); A astigmatism; H hyperopia (farsighted); P presbyopia; Mrx spectacle prescription;  CTL contact lenses; OD right eye; OS left eye; OU both eyes  XT exotropia; ET esotropia; PEK punctate epithelial keratitis; PEE punctate epithelial erosions; DES dry eye syndrome; MGD meibomian gland dysfunction; ATs artificial tears; PFAT's preservative free artificial tears; [place] nuclear sclerotic cataract; PSC posterior subcapsular cataract; ERM  epi-retinal membrane; PVD posterior vitreous detachment; RD retinal detachment; DM diabetes mellitus; DR diabetic retinopathy; NPDR non-proliferative diabetic retinopathy; PDR proliferative diabetic retinopathy; CSME clinically significant macular edema; DME diabetic macular edema; dbh dot blot hemorrhages; CWS cotton wool spot; POAG primary open angle glaucoma; C/D cup-to-disc ratio; HVF humphrey visual field; GVF goldmann visual field; OCT optical coherence tomography; IOP intraocular pressure; BRVO Branch retinal vein occlusion; CRVO central retinal vein occlusion; CRAO central  retinal artery occlusion; BRAO branch retinal artery occlusion; RT retinal tear; SB scleral buckle; PPV pars plana vitrectomy; VH Vitreous hemorrhage; PRP panretinal laser photocoagulation; IVK intravitreal kenalog; VMT vitreomacular traction; MH Macular hole;  NVD neovascularization of the disc; NVE neovascularization elsewhere; AREDS age related eye disease study; ARMD age related macular degeneration; POAG primary open angle glaucoma; EBMD epithelial/anterior basement membrane dystrophy; ACIOL anterior chamber intraocular lens; IOL intraocular lens; PCIOL posterior chamber intraocular lens; Phaco/IOL phacoemulsification with intraocular lens placement; [place] photorefractive keratectomy; LASIK laser assisted in situ keratomileusis; HTN hypertension; DM diabetes mellitus; COPD chronic obstructive pulmonary disease

## 2019-10-02 ENCOUNTER — Ambulatory Visit (INDEPENDENT_AMBULATORY_CARE_PROVIDER_SITE_OTHER): Payer: Medicare Other | Admitting: *Deleted

## 2019-10-02 DIAGNOSIS — I442 Atrioventricular block, complete: Secondary | ICD-10-CM

## 2019-10-02 LAB — CUP PACEART REMOTE DEVICE CHECK
Battery Remaining Longevity: 130 mo
Battery Remaining Percentage: 95.5 %
Battery Voltage: 2.98 V
Brady Statistic AP VP Percent: 1 %
Brady Statistic AP VS Percent: 1 %
Brady Statistic AS VP Percent: 99 %
Brady Statistic AS VS Percent: 1 %
Brady Statistic RA Percent Paced: 1 %
Brady Statistic RV Percent Paced: 99 %
Date Time Interrogation Session: 20210913020015
Implantable Lead Implant Date: 20160322
Implantable Lead Implant Date: 20160322
Implantable Lead Location: 753859
Implantable Lead Location: 753860
Implantable Lead Model: 1948
Implantable Pulse Generator Implant Date: 20160322
Lead Channel Impedance Value: 390 Ohm
Lead Channel Impedance Value: 730 Ohm
Lead Channel Pacing Threshold Amplitude: 0.625 V
Lead Channel Pacing Threshold Amplitude: 0.75 V
Lead Channel Pacing Threshold Pulse Width: 0.4 ms
Lead Channel Pacing Threshold Pulse Width: 0.4 ms
Lead Channel Sensing Intrinsic Amplitude: 4.1 mV
Lead Channel Sensing Intrinsic Amplitude: 9.9 mV
Lead Channel Setting Pacing Amplitude: 0.875
Lead Channel Setting Pacing Amplitude: 2 V
Lead Channel Setting Pacing Pulse Width: 0.4 ms
Lead Channel Setting Sensing Sensitivity: 2.5 mV
Pulse Gen Model: 2240
Pulse Gen Serial Number: 7731685

## 2019-10-03 DIAGNOSIS — Z23 Encounter for immunization: Secondary | ICD-10-CM | POA: Diagnosis not present

## 2019-10-04 NOTE — Progress Notes (Signed)
Remote pacemaker transmission.   

## 2019-10-14 DIAGNOSIS — J439 Emphysema, unspecified: Secondary | ICD-10-CM | POA: Diagnosis not present

## 2019-10-14 DIAGNOSIS — M8580 Other specified disorders of bone density and structure, unspecified site: Secondary | ICD-10-CM | POA: Diagnosis not present

## 2019-10-14 DIAGNOSIS — W19XXXA Unspecified fall, initial encounter: Secondary | ICD-10-CM | POA: Diagnosis not present

## 2019-10-14 DIAGNOSIS — Z950 Presence of cardiac pacemaker: Secondary | ICD-10-CM | POA: Diagnosis not present

## 2019-10-14 DIAGNOSIS — R45851 Suicidal ideations: Secondary | ICD-10-CM | POA: Diagnosis not present

## 2019-10-14 DIAGNOSIS — S59901A Unspecified injury of right elbow, initial encounter: Secondary | ICD-10-CM | POA: Diagnosis not present

## 2019-10-14 DIAGNOSIS — S72141A Displaced intertrochanteric fracture of right femur, initial encounter for closed fracture: Secondary | ICD-10-CM | POA: Diagnosis not present

## 2019-10-14 DIAGNOSIS — D649 Anemia, unspecified: Secondary | ICD-10-CM | POA: Diagnosis not present

## 2019-10-14 DIAGNOSIS — J9602 Acute respiratory failure with hypercapnia: Secondary | ICD-10-CM | POA: Diagnosis not present

## 2019-10-14 DIAGNOSIS — M85859 Other specified disorders of bone density and structure, unspecified thigh: Secondary | ICD-10-CM | POA: Diagnosis not present

## 2019-10-14 DIAGNOSIS — M47812 Spondylosis without myelopathy or radiculopathy, cervical region: Secondary | ICD-10-CM | POA: Diagnosis not present

## 2019-10-14 DIAGNOSIS — S72009A Fracture of unspecified part of neck of unspecified femur, initial encounter for closed fracture: Secondary | ICD-10-CM | POA: Diagnosis not present

## 2019-10-14 DIAGNOSIS — S72001A Fracture of unspecified part of neck of right femur, initial encounter for closed fracture: Secondary | ICD-10-CM | POA: Diagnosis not present

## 2019-10-14 DIAGNOSIS — S0910XA Unspecified injury of muscle and tendon of head, initial encounter: Secondary | ICD-10-CM | POA: Diagnosis not present

## 2019-10-14 DIAGNOSIS — C61 Malignant neoplasm of prostate: Secondary | ICD-10-CM | POA: Diagnosis not present

## 2019-10-14 DIAGNOSIS — S0990XA Unspecified injury of head, initial encounter: Secondary | ICD-10-CM | POA: Diagnosis not present

## 2019-10-14 DIAGNOSIS — S51011A Laceration without foreign body of right elbow, initial encounter: Secondary | ICD-10-CM | POA: Diagnosis not present

## 2019-10-14 DIAGNOSIS — Z043 Encounter for examination and observation following other accident: Secondary | ICD-10-CM | POA: Diagnosis not present

## 2019-10-14 DIAGNOSIS — J9600 Acute respiratory failure, unspecified whether with hypoxia or hypercapnia: Secondary | ICD-10-CM | POA: Diagnosis not present

## 2019-10-14 DIAGNOSIS — E871 Hypo-osmolality and hyponatremia: Secondary | ICD-10-CM | POA: Diagnosis not present

## 2019-10-15 DIAGNOSIS — R45851 Suicidal ideations: Secondary | ICD-10-CM | POA: Diagnosis not present

## 2019-10-15 DIAGNOSIS — R0602 Shortness of breath: Secondary | ICD-10-CM | POA: Diagnosis not present

## 2019-10-15 DIAGNOSIS — Z720 Tobacco use: Secondary | ICD-10-CM | POA: Diagnosis not present

## 2019-10-15 DIAGNOSIS — J9602 Acute respiratory failure with hypercapnia: Secondary | ICD-10-CM | POA: Diagnosis not present

## 2019-10-15 DIAGNOSIS — D649 Anemia, unspecified: Secondary | ICD-10-CM | POA: Diagnosis not present

## 2019-10-15 DIAGNOSIS — R918 Other nonspecific abnormal finding of lung field: Secondary | ICD-10-CM | POA: Diagnosis not present

## 2019-10-15 DIAGNOSIS — J449 Chronic obstructive pulmonary disease, unspecified: Secondary | ICD-10-CM | POA: Diagnosis not present

## 2019-10-15 DIAGNOSIS — I469 Cardiac arrest, cause unspecified: Secondary | ICD-10-CM | POA: Diagnosis not present

## 2019-10-15 DIAGNOSIS — K219 Gastro-esophageal reflux disease without esophagitis: Secondary | ICD-10-CM | POA: Diagnosis not present

## 2019-10-15 DIAGNOSIS — R0902 Hypoxemia: Secondary | ICD-10-CM | POA: Diagnosis not present

## 2019-10-15 DIAGNOSIS — S72141A Displaced intertrochanteric fracture of right femur, initial encounter for closed fracture: Secondary | ICD-10-CM | POA: Diagnosis not present

## 2019-10-15 DIAGNOSIS — S72009A Fracture of unspecified part of neck of unspecified femur, initial encounter for closed fracture: Secondary | ICD-10-CM | POA: Diagnosis not present

## 2019-10-15 DIAGNOSIS — U071 COVID-19: Secondary | ICD-10-CM | POA: Diagnosis not present

## 2019-10-15 DIAGNOSIS — Z4682 Encounter for fitting and adjustment of non-vascular catheter: Secondary | ICD-10-CM | POA: Diagnosis not present

## 2019-10-15 DIAGNOSIS — R570 Cardiogenic shock: Secondary | ICD-10-CM | POA: Diagnosis not present

## 2019-10-19 ENCOUNTER — Encounter (INDEPENDENT_AMBULATORY_CARE_PROVIDER_SITE_OTHER): Payer: Medicare Other | Admitting: Ophthalmology

## 2019-10-20 DEATH — deceased
